# Patient Record
Sex: Male | Born: 1965
Health system: Southern US, Community
[De-identification: ages and names within clinical notes are randomized; demographics above are authoritative.]

## PROBLEM LIST (undated history)

## (undated) DIAGNOSIS — K219 Gastro-esophageal reflux disease without esophagitis: Secondary | ICD-10-CM

## (undated) DIAGNOSIS — T4145XA Adverse effect of unspecified anesthetic, initial encounter: Secondary | ICD-10-CM

## (undated) DIAGNOSIS — E785 Hyperlipidemia, unspecified: Secondary | ICD-10-CM

## (undated) DIAGNOSIS — F329 Major depressive disorder, single episode, unspecified: Secondary | ICD-10-CM

## (undated) DIAGNOSIS — F32A Depression, unspecified: Secondary | ICD-10-CM

## (undated) DIAGNOSIS — Z8719 Personal history of other diseases of the digestive system: Secondary | ICD-10-CM

## (undated) DIAGNOSIS — Z9889 Other specified postprocedural states: Secondary | ICD-10-CM

## (undated) DIAGNOSIS — T8859XA Other complications of anesthesia, initial encounter: Secondary | ICD-10-CM

## (undated) DIAGNOSIS — D0430 Carcinoma in situ of skin of unspecified part of face: Secondary | ICD-10-CM

## (undated) DIAGNOSIS — R519 Headache, unspecified: Secondary | ICD-10-CM

## (undated) DIAGNOSIS — F419 Anxiety disorder, unspecified: Secondary | ICD-10-CM

## (undated) DIAGNOSIS — I10 Essential (primary) hypertension: Secondary | ICD-10-CM

## (undated) DIAGNOSIS — R51 Headache: Secondary | ICD-10-CM

## (undated) DIAGNOSIS — C801 Malignant (primary) neoplasm, unspecified: Secondary | ICD-10-CM

## (undated) DIAGNOSIS — K210 Gastro-esophageal reflux disease with esophagitis: Secondary | ICD-10-CM

## (undated) DIAGNOSIS — K449 Diaphragmatic hernia without obstruction or gangrene: Secondary | ICD-10-CM

## (undated) DIAGNOSIS — J302 Other seasonal allergic rhinitis: Secondary | ICD-10-CM

## (undated) HISTORY — PX: NOSE SURGERY: SHX723

## (undated) HISTORY — DX: Anxiety disorder, unspecified: F41.9

## (undated) HISTORY — DX: Personal history of other diseases of the digestive system: Z87.19

## (undated) HISTORY — DX: Gastro-esophageal reflux disease without esophagitis: K21.9

## (undated) HISTORY — DX: Essential (primary) hypertension: I10

## (undated) HISTORY — DX: Other specified postprocedural states: Z98.890

## (undated) HISTORY — DX: Other seasonal allergic rhinitis: J30.2

---

## 1998-05-04 ENCOUNTER — Ambulatory Visit (HOSPITAL_COMMUNITY): Admission: RE | Admit: 1998-05-04 | Discharge: 1998-05-04 | Payer: Self-pay | Admitting: Obstetrics and Gynecology

## 1998-05-21 HISTORY — PX: VARICOCELE EXCISION: SUR582

## 1998-06-04 ENCOUNTER — Ambulatory Visit (HOSPITAL_COMMUNITY): Admission: RE | Admit: 1998-06-04 | Discharge: 1998-06-04 | Payer: Self-pay | Admitting: Obstetrics and Gynecology

## 1999-09-08 ENCOUNTER — Encounter (INDEPENDENT_AMBULATORY_CARE_PROVIDER_SITE_OTHER): Payer: Self-pay | Admitting: Specialist

## 1999-09-08 ENCOUNTER — Ambulatory Visit (HOSPITAL_BASED_OUTPATIENT_CLINIC_OR_DEPARTMENT_OTHER): Admission: RE | Admit: 1999-09-08 | Discharge: 1999-09-08 | Payer: Self-pay | Admitting: Otolaryngology

## 2000-03-01 ENCOUNTER — Encounter (INDEPENDENT_AMBULATORY_CARE_PROVIDER_SITE_OTHER): Payer: Self-pay | Admitting: Specialist

## 2000-03-01 ENCOUNTER — Ambulatory Visit (HOSPITAL_BASED_OUTPATIENT_CLINIC_OR_DEPARTMENT_OTHER): Admission: RE | Admit: 2000-03-01 | Discharge: 2000-03-01 | Payer: Self-pay | Admitting: Otolaryngology

## 2001-02-27 ENCOUNTER — Ambulatory Visit (HOSPITAL_COMMUNITY): Admission: RE | Admit: 2001-02-27 | Discharge: 2001-02-27 | Payer: Self-pay | Admitting: Urology

## 2001-07-01 ENCOUNTER — Emergency Department (HOSPITAL_COMMUNITY): Admission: EM | Admit: 2001-07-01 | Discharge: 2001-07-01 | Payer: Self-pay | Admitting: Emergency Medicine

## 2001-07-01 ENCOUNTER — Encounter: Payer: Self-pay | Admitting: Emergency Medicine

## 2002-05-21 HISTORY — PX: SHOULDER SURGERY: SHX246

## 2003-06-18 ENCOUNTER — Encounter: Admission: RE | Admit: 2003-06-18 | Discharge: 2003-06-18 | Payer: Self-pay | Admitting: Family Medicine

## 2003-06-21 ENCOUNTER — Encounter: Admission: RE | Admit: 2003-06-21 | Discharge: 2003-06-21 | Payer: Self-pay | Admitting: Family Medicine

## 2007-02-20 ENCOUNTER — Encounter: Admission: RE | Admit: 2007-02-20 | Discharge: 2007-02-20 | Payer: Self-pay | Admitting: Otolaryngology

## 2007-05-22 HISTORY — PX: KNEE SURGERY: SHX244

## 2007-07-16 ENCOUNTER — Emergency Department (HOSPITAL_COMMUNITY): Admission: EM | Admit: 2007-07-16 | Discharge: 2007-07-16 | Payer: Self-pay | Admitting: Emergency Medicine

## 2010-03-28 ENCOUNTER — Encounter: Admission: RE | Admit: 2010-03-28 | Discharge: 2010-03-28 | Payer: Self-pay | Admitting: Otolaryngology

## 2010-06-14 DIAGNOSIS — F32A Depression, unspecified: Secondary | ICD-10-CM | POA: Insufficient documentation

## 2010-06-14 DIAGNOSIS — F329 Major depressive disorder, single episode, unspecified: Secondary | ICD-10-CM | POA: Insufficient documentation

## 2010-06-29 ENCOUNTER — Other Ambulatory Visit: Payer: Self-pay | Admitting: Otolaryngology

## 2010-10-06 NOTE — Op Note (Signed)
[place]. [facility]  Patient:    Jeremy Diaz, Jeremy Diaz                         MRN: [id number] Proc. Date: 10/12/99 Adm. Date:  [id number] Disc. Date: [id number] Attending:  Lucky Cowboy CC:         [facility]                           Operative Report  PREOPERATIVE DIAGNOSES:  Inferior turbinate hypertrophy, septal deviation, left concha bullosa, chronic ethmoid sinusitis, and chronic frontal sinusitis.  PROCEDURES PERFORMED:  Inferior turbinate reduction, septoplasty, left concha bullosa resection, bilateral total ethmoidectomies, and bilateral frontal recess explorations with InstaTrak guidance.  SURGEON:  Lucky Cowboy, M.D.  ANESTHESIA:  General endotracheal anesthesia.  ESTIMATED BLOOD LOSS:  50 cc.  SPECIMENS:  Ethmoid contents.  COMPLICATIONS:  None.  INDICATIONS:  This patient has undergone multiple courses of medical therapy with inability to resolve CT proven ethmoid and anterior frontal sinusitis. The patient is having persistent headaches.  For this reason, the above procedures are performed.  In addition, the patient is suffering from nasal obstruction, which has not been relieved with medical therapy.  FINDINGS:  The patient was noted to have significant nasal deformity with bilateral inferior turbinate hypertrophy, both bony and mucosal.  There was mucosal thickening, but no frank pus throughout both ethmoid cavities in the areas of the frontal recess.  DESCRIPTION OF PROCEDURE:  The patient was taken to the operating room and placed on the table in the supine position.  The table was then rotated counterclockwise 90 degrees, and the head was prepped with Betadine, and the InstaTrak system was placed securely.  The patient was then sterilely draped. The InstaTrak was then calibrated.  Each nasal cavity was then decongested with Afrin cottonoid sponges.  The left nasal cavity was performed first. 1% lidocaine with 1:100,000  of epinephrine was then used to inject the superior portion of the uncinate process and the insertion of the middle turbinate.  The middle turbinate head was also injected in a similar fashion. After allowing time for vasoconstriction, a #15 blade was used to make an incision in the vertical mid portion of the middle turbinate.  The lateral portion of the concha bullosa was then resected using the microdebrider, as well as the Tru-Cut forceps.  At this point, attention was then turned to resection of the uncinate processes, which was performed using pediatric, backbiting forceps and the microdebrider.  The ethmoid bulla was then identified and entered.  Initiation of the resection began inferiorly and medially, and it proceeded posteriorly.  Bone was excessively removed between the middle turbinate and the lamina papyracea, leaving an inferior strut along the ethmoid bulla.  After the skull base was identified posteriorly, the dissection continued in an anterior superior fashion.  The skull base was protected at all times.  The InstaTrak was used during different portions of this procedure.  The frontal recess area was then identified after taking down the agger nasi cell.  There was mucosal edema in this area.  The mucosa was not stripped from this area.  The frontal recess, although it was edematous, it was patent.  The anterior ethmoid artery was left undisturbed.  After this, attention was turned to the septoplasty portion of the procedure.  A left hemitransfixion incision was made with a #15 blade.  Mucoperichondrial and  mucoperiosteal flaps were elevated.  The bony cartilaginous junction was divided using a Therapist, nutritional, and the posterior portion of the quadrangular cartilage, the anterior portion of the perpendicular plate of the ethmoid, and the vomer were dissected after sharp division superiorly using the Jansen-Middleton forceps.  The maxillary crest was then taken down using  an osteotome in standard fashion.  There was significant spur inferiorly.  There was a significant anterior, left septal deviation.  This appeared to be medialized after resection of the posterior portion of the quadrangular bone and taking an inferior strut of the cartilage.  Once this was performed, attention was then turned to the right ethmoidectomy portion of the procedure. In a similar fashion, injection was performed of the lateral nasal wall on the uncinate process.  The uncinate process was then taken down after hemostasis, using the pediatric backbiting forceps.  Once this was performed, the ethmoid bulla was entered low and medially with the microdebrider.  The microdebrider was then used to successively and cleanly remove bone and mucosa down to the skull base posteriorly.  The skull base was then traced in a superior and anterior fashion.  The frontal recess was identified after taking down the agger nasi cell.  There was significant mucosal edema.  However, the frontal recess area appeared opened.  Once this was performed, the inferior turbinates were reduced.  This was performed after medialization injection with 1% lidocaine with 1:100,000 of epinephrine.  The inferior portion of the turbinates were resected using the microdebrider and the Tru-Cut to reduce the bone.  Suction cautery was used for hemostasis bilaterally.  There was a small tear in the left septal cartilage.  It was reapproximated in a simple, interrupted fashion using 5-0 chromic.  There was also a small tear along the anterior, right septum, which was reapproximated in a similar fashion. Gelfilm was placed in each ethmoid cavity, which were then filled with Bactroban ointment.  Once this was performed, the hemitransfixion was reapproximated in a simple, interrupted fashion using 4-0 chromic.  Each nasal cavity was packed with Merocel packs, which were coated with Bactroban ointment.  Prior to this, septal  stents were placed and secured transseptally in a horizontal mattress fashion with #0 silk.  The oral cavity was then suctioned of any blood.  The table was rotated clockwise 90 degrees to its  original position.  The patient was awakened from anesthesia and extubated in the operating room.  He was taken to the post anesthesia care unit in stable condition.  There were no complications. DD:  10/12/99 TD:  10/16/99 Job: [id number]

## 2010-10-06 NOTE — Op Note (Signed)
[facility]  Patient:    Jeremy Diaz, Jeremy Diaz Visit Number: [id number] MRN: [id number]          Service Type: DSU Location: DAY Attending Physician:  Lurene Shadow. Date: 02/27/01 Admit Date:  02/27/2001                             Operative Report  PREOPERATIVE DIAGNOSIS.  Left varicocele.  POSTOPERATIVE DIAGNOSIS:  Left varicocele.  OPERATION:  Laparoscopic left internal splanchnic vein ligation.  SURGEON:  Sigmund I. Patsi Sears, M.D.  ANESTHESIA:  General endotracheal.  PREPARATION:  After appropriate preanesthesia, the patient was brought to the operating room, placed on the operating table in the dorsal supine position where general endotracheal anesthesia was introduced.  Foley catheter was inserted, and the abdomen was shaved, prepped and draped with Betadine solution and draped in the usual fashion.  He was placed in Trendelenburg.  DESCRIPTION OF PROCEDURE:  A size 10-11 port was placed through the 6 oclock position at the navel and into the abdomen without difficulty.  Two other ports were then placed in the right abdomen, a 5 mm port in the right upper abdomen, and a 10 mm port in the right lower abdomen.  Cauterization of the subcu tissue was accomplished.  At the 10 mm port site, a large vein was identified subcutaneously, and this was ligated with 3-0 silk suture.  No bleeding was noted.  Once the ports were placed, the left spermatic cord was identified.  The vas was identified at the inferior portion of the spermatic cord, and the entire rest of the spermatic cord was clipped with multiple clips.  The artery to the testicle was not completely identified, but the posterior portion around the vas was not dissected in order to preserve the artery to the vas.  There was no artery found in the portion of the cord which was clipped.  Following this, no bleeding was noted.  Each port entrance was evaluated, and there was no  evidence of bleeding.  Using the Endoclose instrument, a 3-0 Vicryl suture was used at the 10-11 site, and the fascia closed easily.  The superficial tissue was then closed with 3-0 Vicryl suture and 4-0 Vicryl was used to close the skin.  Benzoin and Steri-Strips were then placed over the wounds.  The video port was closed with 2-0 Vicryl, and then 3-0 Vicryl.  Then 4-0 Vicryl was used to close the skin, and Benzoin and Steri-Strips were placed.  Then 4-0 Vicryl was used to close the 5 mm port, and Benzoin and Steri-Strips were placed as well.  The patient tolerated the procedure well. It was noted that all CO2 was evacuated from the scrotum and the abdomen prior to the finish of the case.  The patient received 30 mg of IV Toradol prior to the end of the case.  He was awakened and taken to recovery room in good condition. Attending Physician:  Laqueta Jean DD:  02/27/01 TD:  02/27/01 Job: [id number]

## 2010-10-06 NOTE — Op Note (Signed)
[place]. [facility]  Patient:    Jeremy Diaz, Jeremy Diaz                         MRN: [id number] Proc. Date: 03/01/00 Adm. Date:  [id number] Attending:  Lucky Cowboy CC:         [facility]  Louanna Raw, M.D.  Dr. Mollie Germany   Operative Report  PREOPERATIVE DIAGNOSES:  Chronic right maxillary sinusitis and ethmoid sinusitis, septal deflection.  POSTOPERATIVE DIAGNOSES:  Chronic right maxillary sinusitis and ethmoid sinusitis, septal deflection.  PROCEDURE:  Revision septoplasty, polypectomy from right middle turbinate and right maxillary antrotomy.  SURGEON:  Lucky Cowboy, M.D.  ANESTHESIA:  General.  ESTIMATED BLOOD LOSS:  30 cc.  SPECIMENS:  None.  COMPLICATIONS:  None.  INDICATIONS:  This patient is a 45 year old male, who has undergone sinus surgery and resection of right concha bullosa with septoplasty in May 2001. He has been noted to have some polypoid regrowth in the right ethmoid cavity or on the medial surface of the middle turbinate, which does not permit visualization of the ethmoid cavity.  There is opacification of the right maxillary sinus.  In addition, there has been some nasal congestion.  FINDINGS:  The patient was noted to have polypoid regrowth with granulation tissue on the medial surface of the cut edge of the right middle turbinate. The ethmoid cavity was clear without polypoid regrowth.  A right maxillary natural ostium was obstructed with perfuse granular and inflammatory tissue. This was removed.  There was edema in the maxillary sinus.  There was a slight bow toward the left side of the anterior septum.  Septum was midline, however. The patient does have small nasal vestibules.  The septum was resected inferiorly to allow relaxation and medialization of the bowing portion of the turbinate.  Cross hatches were also made on the left side.  PROCEDURE:  The patient was taken to the operating room and placed on  the table in the supine position.  He was then placed under general endotracheal anesthesia and the table rotated counterclockwise 90 degrees.  Instatrak head gear was applied.  It was calibrated after the patient was prepped and draped with Betadine and draped in the usual sterile fashion.  Each nasal cavity was decongested with Afrin on cottonoid sponges.  Calibration of the Instatrak was performed.  Right nasal sinus surgery was performed first.  The polyp, as well as, the lateral sinus wall was injected with 1% lidocaine with 1:100,000 epinephrine.  After allowing time for hemostasis, debrider was then used to resect the medial portion of the middle turbinate.  There was a moderate amount of bleeding.  The ethmoid cavity was then visualized and found to be clear without regrowth or signs of infection.  Attempt was made to visualize the natural antrotomy site, however this was quite difficult.  A defect in the wall was then created and an opening enhanced with the microdebrider and through-cut forceps.  Maxillary sinus was then suctioned out completely. Attention was then turned to the revision septoplasty.  The anterior septum was injected with 1% lidocaine with 1:100,000 epinephrine. After allowing time for decongestion, a left hemitransfixion incision was made and submucoperichondrial flap elevated.  The inferior portion of the septum was taken down leaving approximately a 1 cm strut anteriorly.  Cross hatches were made in the posterior portion of the cartilage.  Septum was manually medialized.  Once this was performed, ______ nasal cavity  was patent.  The incision was reapproximated in a simple interrupted fashion using 4-0 chromic. Two Merocel packs were placed in the left anterior nasal cavity after placing a mattress stitch through the septum using 4-0 chromic.  Merocel pack was also placed on the lateral surface of the right middle turbinate.  The anterior septum was also packed  with an anterior Merocel pack as it was noted that the mattress suture had somewhat relaxed after placing the packs on the left side. Oral cavity was suctioned, the table rotated clockwise 90 degrees to its original position.  The patient was awakened from anesthesia and taken to the post anesthesia care unit in stable condition.  There were no complications. DD:  03/01/00 TD:  03/02/00 Job: [id number]

## 2011-02-21 ENCOUNTER — Encounter (INDEPENDENT_AMBULATORY_CARE_PROVIDER_SITE_OTHER): Payer: Self-pay | Admitting: General Surgery

## 2011-02-23 ENCOUNTER — Encounter (INDEPENDENT_AMBULATORY_CARE_PROVIDER_SITE_OTHER): Payer: Self-pay | Admitting: General Surgery

## 2011-02-23 ENCOUNTER — Ambulatory Visit (INDEPENDENT_AMBULATORY_CARE_PROVIDER_SITE_OTHER): Payer: BC Managed Care – PPO | Admitting: General Surgery

## 2011-02-23 VITALS — BP 108/70 | HR 76 | Temp 97.7°F | Ht 67.0 in | Wt 186.0 lb

## 2011-02-23 DIAGNOSIS — IMO0002 Reserved for concepts with insufficient information to code with codable children: Secondary | ICD-10-CM

## 2011-02-23 DIAGNOSIS — S76219A Strain of adductor muscle, fascia and tendon of unspecified thigh, initial encounter: Secondary | ICD-10-CM

## 2011-02-23 NOTE — Progress Notes (Signed)
Chief Complaint  Patient presents with  . right inguinal hernia    HPI Jeremy Diaz is a 45 y.o. male.   HPI 45 year old Caucasian male referred by Dr. Collins Scotland for evaluation of right inguinal pain. The patient states that he has been having discomfort in his right groin for 6 weeks. He denies any trauma to the area. He denies any heavy lifting. He states that he has a constant discomfort in one area. It will occasionally become sharp. The increased discomfort will last for about 15 minutes. It will feel like a burning sensation when it increases in severity. He describes the constant discomfort as a pain that is always there. He denies any dysuria, diarrhea, constipation, nausea, or vomiting. He has never noticed a bulge. He initially saw a urologist his thought that it was more consistent with a inguinal strain. He then followed up with his primary care physician. He has had varicocele surgery in the past. He has not tried any medications to help with the discomfort. Past Medical History  Diagnosis Date  . Seasonal allergies   . GERD (gastroesophageal reflux disease)   . Anxiety     Past Surgical History  Procedure Date  . Nose surgery 200/2003/2008/2012  . Knee surgery 2009    right  . Shoulder surgery 2004    left  . Varicocele excision 2000    No family history on file.  Social History History  Substance Use Topics  . Smoking status: Never Smoker   . Smokeless tobacco: Not on file  . Alcohol Use: No    Allergies  Allergen Reactions  . Skelaxin Hives    Current Outpatient Prescriptions  Medication Sig Dispense Refill  . AMBULATORY NON FORMULARY MEDICATION Inject into the muscle 2 (two) times a week. Medication Name:allergy shots       . citalopram (CELEXA) 40 MG tablet Take 40 mg by mouth daily.        Jeremy Diaz esomeprazole (NEXIUM) 40 MG capsule Take 40 mg by mouth daily before breakfast.          Review of Systems Review of Systems  Constitutional: Positive for activity  change (see hpi). Negative for chills, fatigue and unexpected weight change.  HENT: Negative for nosebleeds and neck pain.   Eyes: Negative for photophobia and visual disturbance.  Respiratory: Negative for chest tightness and shortness of breath.   Cardiovascular: Negative for chest pain and leg swelling.  Gastrointestinal:       See hpi  Genitourinary: Negative for dysuria, urgency, hematuria, discharge, difficulty urinating and testicular pain.  Musculoskeletal: Negative for arthralgias.  Neurological: Positive for headaches. Negative for seizures, syncope, speech difficulty and numbness.  Hematological: Negative.   Psychiatric/Behavioral: Negative.     Blood pressure 108/70, pulse 76, temperature 97.7 F (36.5 C), temperature source Temporal, height 5\' 7"  (1.702 m), weight 186 lb (84.369 kg).  Physical Exam Physical Exam  Vitals reviewed. Constitutional: He is oriented to person, place, and time. He appears well-developed and well-nourished.  HENT:  Head: Normocephalic and atraumatic.  Eyes: Conjunctivae are normal. No scleral icterus.  Neck: Normal range of motion. Neck supple. No JVD present. No tracheal deviation present.  Cardiovascular: Normal rate and regular rhythm.   Pulmonary/Chest: Effort normal and breath sounds normal. He has no wheezes. He exhibits no tenderness.  Abdominal: Soft. Bowel sounds are normal. He exhibits no distension. There is no tenderness. There is no rebound.       Well healed umbilical incision; can't palpate inguinal  hernia supine or standing  Genitourinary: Testes normal and penis normal.  Musculoskeletal: Normal range of motion.  Lymphadenopathy:    He has no cervical adenopathy.  Neurological: He is alert and oriented to person, place, and time.  Skin: Skin is warm and dry.  Psychiatric: He has a normal mood and affect. His behavior is normal. Judgment and thought content normal.    Data Reviewed Dr Alda Berthold note  Assessment    Right  inguinal strain    Plan    I donot feel a hernia on exam.  I think this is most consistent with a inguinal strain with resultant nerve irritation. The pt was given Agricultural engineer.     We discussed several options. We discussed ongoing observation versus imaging. He has been having symptoms for 6 weeks without any improvement. We discussed the utility of an ultrasound versus a CT scan versus an MRI. I do not believe an ultrasound would be of much benefit. We discussed the pros and cons of getting a CT scan of his pelvis. I explained that it is not 100% sensitive or specific to confirm an inguinal hernia. However the patient would like to proceed with getting a CT scan of the pelvis to confirm a physical exam findings. I do not think this is unreasonable since he has not noticed any improvement in the past 6 weeks. It is possible he may have a early or small inguinal hernia. We will schedule a CT of his pelvis for early next week. I will base my followup on the results of the CAT scan.   [name]. Andrey Campanile, MD, FACS  Gaynelle Adu M 02/23/2011, 1:58 PM

## 2011-02-23 NOTE — Patient Instructions (Signed)
Inguinal Strain Your exam shows you have an inguinal strain. This is also known as a pulled groin. This injury is usually due to a pull or partial tear to a muscle or tendon in the groin area. Most groin pulls take several weeks to heal completely. There may be pain with lifting your leg or walking during much of your recovery. Treatment for groin strains includes:  Rest and avoid lifting or performing activities that increase your pain.   Apply ice packs for 20-30 minutes every few hours to reduce pain and swelling over the next 2-3 days.   Medicine to reduce pain and inflammation is often prescribed.   You can take Motrin or ibuprofen (follow package directions) HOME CARE INSTRUCTIONS While most strains in the groin area will heal with rest, you should also watch for any signs of a more serious condition.  SEEK IMMEDIATE MEDICAL CARE IF:  You notice unusual swelling or bulging in the groin.   You have pain or swelling in the testicle.   Blood in your urine.   Marked increased pain.   Weakness or numbness of your leg or abdominal pain.  MAKE SURE YOU:   Understand these instructions.   Will watch your condition.   Will get help right away if you are not doing well or get worse.  Document Released: 06/14/2004 Document Re-Released: 04/19/2008 [facility] Patient Information 2011 [place], [place].

## 2011-02-28 ENCOUNTER — Ambulatory Visit
Admission: RE | Admit: 2011-02-28 | Discharge: 2011-02-28 | Disposition: A | Discharge status: Home / Self Care | Payer: BC Managed Care – PPO | Source: Ambulatory Visit | Attending: General Surgery | Admitting: General Surgery

## 2011-02-28 ENCOUNTER — Telehealth (INDEPENDENT_AMBULATORY_CARE_PROVIDER_SITE_OTHER): Payer: Self-pay | Admitting: General Surgery

## 2011-02-28 DIAGNOSIS — S76219A Strain of adductor muscle, fascia and tendon of unspecified thigh, initial encounter: Secondary | ICD-10-CM

## 2011-02-28 MED ORDER — IOHEXOL 300 MG/ML  SOLN
100.0000 mL | Freq: Once | INTRAMUSCULAR | Status: AC | PRN
  Administered 2011-02-28: 100 mL via INTRAVENOUS

## 2011-02-28 NOTE — Telephone Encounter (Signed)
Patient called for CT results. I called him back and let him know that the CT shows hernias on both sides in his groin. The doctor will discuss further at his follow up.

## 2011-03-13 ENCOUNTER — Encounter (INDEPENDENT_AMBULATORY_CARE_PROVIDER_SITE_OTHER): Payer: Self-pay | Admitting: General Surgery

## 2011-03-13 ENCOUNTER — Ambulatory Visit (INDEPENDENT_AMBULATORY_CARE_PROVIDER_SITE_OTHER): Payer: BC Managed Care – PPO | Admitting: General Surgery

## 2011-03-13 VITALS — BP 114/84 | HR 64 | Temp 98.3°F | Resp 20 | Ht 67.0 in | Wt 188.4 lb

## 2011-03-13 DIAGNOSIS — K4020 Bilateral inguinal hernia, without obstruction or gangrene, not specified as recurrent: Secondary | ICD-10-CM

## 2011-03-13 NOTE — Patient Instructions (Signed)
We will call you regarding scheduling the repair of your hernias.

## 2011-03-13 NOTE — Progress Notes (Signed)
Jeremy Diaz is a 45 year old man see by Dr. Andrey Campanile for right groin pain.  He does a lot of repetitive lifting at work.  Dr. Andrey Campanile could not feel a definite hernia on exam and so a CT scan was ordered.  This demonstrated bilateral inguinal hernias containing fat with the left side larger than the right side.  There were clips noted in the left groin as well.  He has asked to see me regarding this.  Exam:  Right groin- no definite hernia is felt.  Left groin- small inguinal bulge felt accentuated with a cough.  Assessment:  Bilateral inguinal hernias containing fat.  Right side with sxs, left side is asx.  He has had a previous laparoscopic left varicocelectomy by Dr. Patsi Sears.  It is unclear whether this was an intraperitoneal or extraperitoneal procedure.  Plan:  I recommended laparoscopic repair of the hernias with mesh.  TEPP vs. TAPP depending on how his varicocelectomy was done.  Other options are open right inguinal repair with repair of the left side at a later date or open bilateral repairs.  I have explained the procedure, risks, and aftercare of inguinal hernia repair.  Risks include but are not limited to bleeding, infection, wound problems, anesthesia, recurrence, bladder or intestine injury, urinary retention, testicular dysfunction, chronic pain, mesh problems.  He seems to understand and agrees to proceed.  Will talk with Dr. Patsi Sears and then call the patient back.

## 2011-03-16 ENCOUNTER — Telehealth (INDEPENDENT_AMBULATORY_CARE_PROVIDER_SITE_OTHER): Payer: Self-pay | Admitting: General Surgery

## 2011-03-16 NOTE — Telephone Encounter (Signed)
I spoke with Dr. Patsi Sears and he did an intra peritoneal procedure.  Thus, I feel there is a good chance a TEPP can be done with good results for Jeremy Diaz bilateral inguinal hernias.  We may have convert to open.  Mr. Ginyard and I discussed this and he agrees with the plan.  Will work on getting this scheduled.

## 2011-03-26 ENCOUNTER — Encounter (HOSPITAL_COMMUNITY): Payer: Self-pay | Admitting: Pharmacy Technician

## 2011-03-27 ENCOUNTER — Encounter (HOSPITAL_COMMUNITY): Payer: BC Managed Care – PPO

## 2011-03-27 ENCOUNTER — Encounter (HOSPITAL_COMMUNITY): Payer: Self-pay

## 2011-03-27 DIAGNOSIS — K219 Gastro-esophageal reflux disease without esophagitis: Secondary | ICD-10-CM

## 2011-03-27 HISTORY — DX: Gastro-esophageal reflux disease without esophagitis: K21.9

## 2011-03-27 LAB — SURGICAL PCR SCREEN
MRSA, PCR: NEGATIVE
Staphylococcus aureus: POSITIVE — AB

## 2011-03-27 NOTE — Pre-Procedure Instructions (Signed)
Pt. Has Amoxicillin "itch" allergy-fax 03-27-11 to Dr. Maris Berger office to advise.

## 2011-03-27 NOTE — Pre-Procedure Instructions (Signed)
PCR screen positive for staph aureus-pt. /MD notified.

## 2011-03-27 NOTE — Patient Instructions (Signed)
20 Jeremy Diaz  03/27/2011   Your procedure is scheduled on: 04-02-11  Report to [facility] at 1015AM.  Call this number if you have problems the morning of surgery: [phone]   Remember:   Do not eat food:After Midnight.  Do not drink clear liquids: 6 Hours before arrival.-nothing after 0600am  Take these medicines the morning of surgery with A SIP OF WATER :Citalopram, Nexium   Do not wear jewelry, make-up or nail polish.  Do not wear lotions, powders, or perfumes. You may wear deodorant.  Do not shave 48 hours prior to surgery.  Do not bring valuables to the hospital.  Contacts, dentures or bridgework may not be worn into surgery.  Leave suitcase in the car. After surgery it may be brought to your room.  For patients admitted to the hospital, checkout time is 11:00 AM the day of discharge.   Patients discharged the day of surgery will not be allowed to drive home.  Name and phone number of your driver:Jeremy Diaz ,spouse [phone]  Special Instructions: CHG Shower Use Special Wash: 1/2 bottle night before surgery and 1/2 bottle morning of surgery.   Please read over the following fact sheets that you were given: MRSA Information

## 2011-03-27 NOTE — Pre-Procedure Instructions (Addendum)
03-27-11-no labs required per pt. Medical history.-W. Alica Shellhammer,RN and anesthesia protocal. Pt aware no Aspirin X 5 days prior.

## 2011-03-28 ENCOUNTER — Encounter (INDEPENDENT_AMBULATORY_CARE_PROVIDER_SITE_OTHER): Payer: Self-pay

## 2011-03-30 ENCOUNTER — Encounter (INDEPENDENT_AMBULATORY_CARE_PROVIDER_SITE_OTHER): Payer: BC Managed Care – PPO | Admitting: General Surgery

## 2011-04-02 ENCOUNTER — Ambulatory Visit (HOSPITAL_COMMUNITY)
Admission: RE | Admit: 2011-04-02 | Discharge: 2011-04-02 | Disposition: A | Discharge status: Home / Self Care | Payer: BC Managed Care – PPO | Source: Ambulatory Visit | Attending: General Surgery | Admitting: General Surgery

## 2011-04-02 ENCOUNTER — Ambulatory Visit (HOSPITAL_COMMUNITY): Payer: BC Managed Care – PPO | Admitting: Certified Registered Nurse Anesthetist

## 2011-04-02 ENCOUNTER — Encounter (HOSPITAL_COMMUNITY): Payer: Self-pay | Admitting: Certified Registered Nurse Anesthetist

## 2011-04-02 ENCOUNTER — Encounter (HOSPITAL_COMMUNITY): Payer: Self-pay | Admitting: *Deleted

## 2011-04-02 ENCOUNTER — Encounter (HOSPITAL_COMMUNITY)
Admission: RE | Disposition: A | Discharge status: Home / Self Care | Payer: Self-pay | Source: Ambulatory Visit | Attending: General Surgery

## 2011-04-02 DIAGNOSIS — K4020 Bilateral inguinal hernia, without obstruction or gangrene, not specified as recurrent: Secondary | ICD-10-CM

## 2011-04-02 DIAGNOSIS — K219 Gastro-esophageal reflux disease without esophagitis: Secondary | ICD-10-CM | POA: Insufficient documentation

## 2011-04-02 DIAGNOSIS — Z01812 Encounter for preprocedural laboratory examination: Secondary | ICD-10-CM | POA: Insufficient documentation

## 2011-04-02 DIAGNOSIS — Z01818 Encounter for other preprocedural examination: Secondary | ICD-10-CM | POA: Insufficient documentation

## 2011-04-02 DIAGNOSIS — Z79899 Other long term (current) drug therapy: Secondary | ICD-10-CM | POA: Insufficient documentation

## 2011-04-02 DIAGNOSIS — F411 Generalized anxiety disorder: Secondary | ICD-10-CM | POA: Insufficient documentation

## 2011-04-02 HISTORY — PX: INGUINAL HERNIA REPAIR: SHX194

## 2011-04-02 SURGERY — REPAIR, HERNIA, INGUINAL, BILATERAL, LAPAROSCOPIC
Anesthesia: General | Site: Groin | Laterality: Bilateral | Wound class: Clean

## 2011-04-02 MED ORDER — BUPIVACAINE-EPINEPHRINE 0.5% -1:200000 IJ SOLN
INTRAMUSCULAR | Status: DC | PRN
  Administered 2011-04-02: 20 mL

## 2011-04-02 MED ORDER — ONDANSETRON HCL 4 MG/2ML IJ SOLN
INTRAMUSCULAR | Status: DC | PRN
  Administered 2011-04-02: 4 mg via INTRAVENOUS

## 2011-04-02 MED ORDER — OXYCODONE-ACETAMINOPHEN 5-325 MG PO TABS: 1.0000 | ORAL_TABLET | ORAL | Status: AC | PRN

## 2011-04-02 MED ORDER — PROMETHAZINE HCL 25 MG/ML IJ SOLN: 6.2500 mg | INTRAMUSCULAR | Status: DC | PRN

## 2011-04-02 MED ORDER — ETOMIDATE 2 MG/ML IV SOLN
INTRAVENOUS | Status: DC | PRN
  Administered 2011-04-02: 14 mg via INTRAVENOUS

## 2011-04-02 MED ORDER — LIDOCAINE HCL (CARDIAC) 20 MG/ML IV SOLN
INTRAVENOUS | Status: DC | PRN
  Administered 2011-04-02: 80 mg via INTRAVENOUS

## 2011-04-02 MED ORDER — LACTATED RINGERS IV SOLN
INTRAVENOUS | Status: DC
  Administered 2011-04-02: 1000 mL via INTRAVENOUS
  Administered 2011-04-02: 15:00:00 via INTRAVENOUS

## 2011-04-02 MED ORDER — EPHEDRINE SULFATE 50 MG/ML IJ SOLN
INTRAMUSCULAR | Status: DC | PRN
  Administered 2011-04-02: 10 mg via INTRAVENOUS

## 2011-04-02 MED ORDER — ACETAMINOPHEN 10 MG/ML IV SOLN
INTRAVENOUS | Status: AC
  Filled 2011-04-02: qty 100

## 2011-04-02 MED ORDER — ACETAMINOPHEN 10 MG/ML IV SOLN
INTRAVENOUS | Status: DC | PRN
  Administered 2011-04-02: 1000 mg via INTRAVENOUS

## 2011-04-02 MED ORDER — LACTATED RINGERS IV SOLN: INTRAVENOUS | Status: DC | PRN

## 2011-04-02 MED ORDER — FENTANYL CITRATE 0.05 MG/ML IJ SOLN
INTRAMUSCULAR | Status: DC | PRN
  Administered 2011-04-02 (×5): 50 ug via INTRAVENOUS

## 2011-04-02 MED ORDER — BUPIVACAINE-EPINEPHRINE 0.5% -1:200000 IJ SOLN
INTRAMUSCULAR | Status: AC
  Filled 2011-04-02: qty 1

## 2011-04-02 MED ORDER — PROMETHAZINE HCL 25 MG/ML IJ SOLN: 12.5000 mg | Freq: Four times a day (QID) | INTRAMUSCULAR | Status: DC | PRN

## 2011-04-02 MED ORDER — FENTANYL CITRATE 0.05 MG/ML IJ SOLN
INTRAMUSCULAR | Status: AC
  Filled 2011-04-02: qty 2

## 2011-04-02 MED ORDER — GLYCOPYRROLATE 0.2 MG/ML IJ SOLN
INTRAMUSCULAR | Status: DC | PRN
  Administered 2011-04-02: .8 mg via INTRAVENOUS

## 2011-04-02 MED ORDER — OXYCODONE HCL 5 MG PO TABS
5.0000 mg | ORAL_TABLET | ORAL | Status: DC | PRN
  Administered 2011-04-02: 10 mg via ORAL

## 2011-04-02 MED ORDER — MIDAZOLAM HCL 5 MG/5ML IJ SOLN
INTRAMUSCULAR | Status: DC | PRN
  Administered 2011-04-02: 2 mg via INTRAVENOUS

## 2011-04-02 MED ORDER — CEFAZOLIN SODIUM-DEXTROSE 2-3 GM-% IV SOLR
2.0000 g | Freq: Once | INTRAVENOUS | Status: AC
  Administered 2011-04-02: 2 g via INTRAVENOUS
  Filled 2011-04-02: qty 50

## 2011-04-02 MED ORDER — OXYCODONE HCL 5 MG PO TABS
ORAL_TABLET | ORAL | Status: AC
  Administered 2011-04-02: 10 mg via ORAL
  Filled 2011-04-02: qty 2

## 2011-04-02 MED ORDER — NEOSTIGMINE METHYLSULFATE 1 MG/ML IJ SOLN
INTRAMUSCULAR | Status: DC | PRN
  Administered 2011-04-02: 5 mg via INTRAVENOUS

## 2011-04-02 MED ORDER — DEXAMETHASONE SODIUM PHOSPHATE 10 MG/ML IJ SOLN
INTRAMUSCULAR | Status: DC | PRN
  Administered 2011-04-02: 10 mg via INTRAVENOUS

## 2011-04-02 MED ORDER — FENTANYL CITRATE 0.05 MG/ML IJ SOLN
25.0000 ug | INTRAMUSCULAR | Status: DC | PRN
  Administered 2011-04-02 (×3): 50 ug via INTRAVENOUS

## 2011-04-02 MED ORDER — ROCURONIUM BROMIDE 100 MG/10ML IV SOLN
INTRAVENOUS | Status: DC | PRN
  Administered 2011-04-02 (×2): 10 mg via INTRAVENOUS
  Administered 2011-04-02 (×2): 5 mg via INTRAVENOUS
  Administered 2011-04-02: 40 mg via INTRAVENOUS

## 2011-04-02 MED ORDER — CEFAZOLIN SODIUM 1-5 GM-% IV SOLN
INTRAVENOUS | Status: AC
  Filled 2011-04-02: qty 100

## 2011-04-02 SURGICAL SUPPLY — 35 items
APPLIER CLIP 5 13 M/L LIGAMAX5 (MISCELLANEOUS) ×2
BENZOIN TINCTURE PRP APPL 2/3 (GAUZE/BANDAGES/DRESSINGS) ×2 IMPLANT
CABLE HIGH FREQUENCY MONO STRZ (ELECTRODE) IMPLANT
CHLORAPREP W/TINT 26ML (MISCELLANEOUS) ×2 IMPLANT
CLIP APPLIE 5 13 M/L LIGAMAX5 (MISCELLANEOUS) ×1 IMPLANT
CLOTH BEACON ORANGE TIMEOUT ST (SAFETY) ×2 IMPLANT
DECANTER SPIKE VIAL GLASS SM (MISCELLANEOUS) ×2 IMPLANT
DEVICE SECURE STRAP 25 ABSORB (INSTRUMENTS) ×2 IMPLANT
DISSECT BALLN SPACEMKR + OVL (BALLOONS) ×2
DISSECTOR BALLN SPACEMKR + OVL (BALLOONS) ×1 IMPLANT
DISSECTOR BLUNT TIP ENDO 5MM (MISCELLANEOUS) ×2 IMPLANT
DRAPE LAPAROSCOPIC ABDOMINAL (DRAPES) ×2 IMPLANT
DRAPE UTILITY XL STRL (DRAPES) ×2 IMPLANT
DRSG TEGADERM 2-3/8X2-3/4 SM (GAUZE/BANDAGES/DRESSINGS) ×4 IMPLANT
ELECT REM PT RETURN 9FT ADLT (ELECTROSURGICAL) ×2
ELECTRODE REM PT RTRN 9FT ADLT (ELECTROSURGICAL) ×1 IMPLANT
GLOVE BIOGEL PI IND STRL 7.0 (GLOVE) ×1 IMPLANT
GLOVE BIOGEL PI INDICATOR 7.0 (GLOVE) ×1
GLOVE ECLIPSE 8.0 STRL XLNG CF (GLOVE) ×2 IMPLANT
GLOVE INDICATOR 8.0 STRL GRN (GLOVE) ×4 IMPLANT
GOWN STRL NON-REIN LRG LVL3 (GOWN DISPOSABLE) ×2 IMPLANT
GOWN STRL REIN XL XLG (GOWN DISPOSABLE) ×4 IMPLANT
KIT BASIN OR (CUSTOM PROCEDURE TRAY) ×2 IMPLANT
MESH PARIETEX 6X6 (Mesh General) ×4 IMPLANT
NEEDLE INSUFFLATION 14GA 120MM (NEEDLE) ×2 IMPLANT
PEN SKIN MARKING BROAD (MISCELLANEOUS) ×2 IMPLANT
SCISSORS LAP 5X35 DISP (ENDOMECHANICALS) ×2 IMPLANT
SET IRRIG TUBING LAPAROSCOPIC (IRRIGATION / IRRIGATOR) ×2 IMPLANT
SOLUTION ANTI FOG 6CC (MISCELLANEOUS) ×2 IMPLANT
STRIP CLOSURE SKIN 1/2X4 (GAUZE/BANDAGES/DRESSINGS) ×2 IMPLANT
SUT MNCRL AB 4-0 PS2 18 (SUTURE) ×2 IMPLANT
TOWEL OR 17X26 10 PK STRL BLUE (TOWEL DISPOSABLE) ×2 IMPLANT
TRAY LAP CHOLE (CUSTOM PROCEDURE TRAY) ×2 IMPLANT
TROCAR CANNULA W/PORT DUAL 5MM (MISCELLANEOUS) ×4 IMPLANT
TUBING INSUFFLATION 10FT LAP (TUBING) ×2 IMPLANT

## 2011-04-02 NOTE — Transfer of Care (Signed)
Immediate Anesthesia Transfer of Care Note  Patient: Jeremy Diaz  Procedure(s) Performed:  LAPAROSCOPIC BILATERAL INGUINAL HERNIA REPAIR - laparoscopic repair bilateral inguinal hernia  Patient Location: PACU  Anesthesia Type: General  Level of Consciousness: awake, alert , oriented and patient cooperative  Airway & Oxygen Therapy: Patient Spontanous Breathing and Patient connected to face mask oxygen  Post-op Assessment: Report given to PACU RN, Post -op Vital signs reviewed and stable and Patient moving all extremities  Post vital signs: Reviewed and stable  Complications: No apparent anesthesia complications

## 2011-04-02 NOTE — H&P (Signed)
Neurologic: Normal   Jeremy Diaz is an 45 y.o. male.    HPI: He has right groin pain and was found to have bilateral inguinal hernias.  He now presents for laparoscopic repair of bilateral inguinal hernias.  Past Medical History  Diagnosis Date  . Seasonal allergies   . Anxiety   . GERD (gastroesophageal reflux disease) 03-27-11    tx. Nexium    Past Surgical History  Procedure Date  . Nose surgery 200/2003/2008/2012  . Knee surgery 2009    right  . Shoulder surgery 2004    left  . Varicocele excision 2000    Medications Prior to Admission  Medication Dose Route Frequency Provider Last Rate Last Dose  . ceFAZolin (ANCEF) IVPB 2 g/50 mL premix  2 g Intravenous Once Adolph Pollack, MD      . lactated ringers infusion   Intravenous Continuous Gaetano Hawthorne, MD 100 mL/hr at 04/02/11 1200 1,000 mL at 04/02/11 1200   Medications Prior to Admission  Medication Sig Dispense Refill  . citalopram (CELEXA) 40 MG tablet Take 40 mg by mouth every morning.       Marland Kitchen esomeprazole (NEXIUM) 40 MG capsule Take 40 mg by mouth daily before breakfast.       . AMBULATORY NON FORMULARY MEDICATION Inject into the muscle 2 (two) times a week. Medication Name:allergy shots        Allergies:  Allergies  Allergen Reactions  . Eggs Or Egg-Derived Products Anaphylaxis  . Amoxil (Amoxicillin Trihydrate) Itching  . Banana Itching  . Skelaxin Hives    History reviewed. No pertinent family history.  Social History:  reports that he has never smoked. He has never used smokeless tobacco. He reports that he does not drink alcohol or use illicit drugs.  General:  Negative  Cardiac  :  Negative  Pulmonary:  Negative  Endocrine:  Negative  Skin:  Negative  Gastrointestinal:  Negative  Genitourinary:  Negative  Neurological:  Negative  Hematologic/Lymphatic:  Negative  HEENT:  Negative  Musculoskeletal:  Negative        BP 130/86  Pulse 65  Temp(Src) 98 F (36.7 C) (Oral)   Resp 18  Wt 190 lb (86.183 kg)  SpO2 97%  General Appearance:  Alert, cooperative, no distress, appears stated age  Head:  Normocephalic, without obvious abnormality, atraumatic  Eyes:   Conjunctiva/corneas clear, EOM's intact      Nose: Nares normal, no drainage   Mouth: Mucous membranes moist  Neck: Supple, symmetrical, trachea midline, no tenderness/mass/nodules, no JVD  Back:   na  Lungs:   Clear to auscultation bilaterally, respirations unlabored  Chest Wall:  No tenderness or deformity  Heart:  Regular rate and rhythm, S1, S2 normal, no murmur, rub or gallop  Abdomen:   Soft, non-tender, bowel sounds active all four quadrants,  no masses, no organomegaly, no scars  GU:  No masses, small left inguinal bulge is present  Rectal:  Not done  Extremities: Extremities normal, atraumatic, no cyanosis or edema     Skin: Skin color, texture, turgor normal, no rashes or lesions  Lymph nodes: No enlarged cervical or supraclavicular nodes                                  Neuro:  Alert and oriented, no focal deficits  No results found for this or any previous visit (from the past 48 hour(s)). No  results found.       Assessment/Plan Bilateral Inguinal hernias  Plan:  Laparoscopic repair with mesh. Procedure, risks, and aftercare explained to him preop.  Derek Huneycutt J 04/02/2011, 12:32 PM

## 2011-04-02 NOTE — Anesthesia Postprocedure Evaluation (Signed)
  Anesthesia Post-op Note  Patient: Jeremy Diaz  Procedure(s) Performed:  LAPAROSCOPIC BILATERAL INGUINAL HERNIA REPAIR - laparoscopic repair bilateral inguinal hernia  Patient Location: PACU  Anesthesia Type: General  Level of Consciousness: awake and alert   Airway and Oxygen Therapy: Patient Spontanous Breathing  Post-op Pain: mild  Post-op Assessment: Post-op Vital signs reviewed, Patient's Cardiovascular Status Stable, Respiratory Function Stable, Patent Airway and No signs of Nausea or vomiting  Post-op Vital Signs: stable  Complications: No apparent anesthesia complications

## 2011-04-02 NOTE — Interval H&P Note (Signed)
History and Physical Interval Note:   04/02/2011   12:37 PM   Jeremy Diaz  has presented today for surgery, with the diagnosis of bilateral inguinal hernia   The various methods of treatment have been discussed with the patient and family. After consideration of risks, benefits and other options for treatment, the patient has consented to  Procedure(s): LAPAROSCOPIC BILATERAL INGUINAL HERNIA REPAIR as a surgical intervention .  The patients' history has been reviewed, patient examined, no change in status, stable for surgery.  I have reviewed the patients' chart and labs.  Questions were answered to the patient's satisfaction.     Adolph Pollack  MD

## 2011-04-02 NOTE — Anesthesia Preprocedure Evaluation (Addendum)
Anesthesia Evaluation  Patient identified by MRN, date of birth, ID band Patient awake    Reviewed: Allergy & Precautions, H&P , NPO status , Patient's Chart, lab work & pertinent test results  Airway       Dental No notable dental hx. (+) Teeth Intact, Caps and Dental Advisory Given,    Pulmonary neg pulmonary ROS,  clear to auscultation  Pulmonary exam normal       Cardiovascular neg cardio ROS     Neuro/Psych Negative Neurological ROS  Negative Psych ROS   GI/Hepatic negative GI ROS, Neg liver ROS, GERD-  ,  Endo/Other  Negative Endocrine ROS  Renal/GU negative Renal ROS  Genitourinary negative   Musculoskeletal negative musculoskeletal ROS (+)   Abdominal Normal abdominal exam  (+)   Peds negative pediatric ROS (+)  Hematology negative hematology ROS (+)   Anesthesia Other Findings   Reproductive/Obstetrics negative OB ROS                          Anesthesia Physical Anesthesia Plan  ASA: I  Anesthesia Plan: General   Post-op Pain Management:    Induction:   Airway Management Planned: Oral ETT  Additional Equipment:   Intra-op Plan:   Post-operative Plan: Extubation in OR  Informed Consent: I have reviewed the patients History and Physical, chart, labs and discussed the procedure including the risks, benefits and alternatives for the proposed anesthesia with the patient or authorized representative who has indicated his/her understanding and acceptance.   Dental advisory given  Plan Discussed with: CRNA and Surgeon  Anesthesia Plan Comments:         Anesthesia Quick Evaluation

## 2011-04-02 NOTE — Preoperative (Signed)
Beta Blockers   Reason not to administer Beta Blockers:Not Applicable 

## 2011-04-02 NOTE — Discharge Summary (Signed)
  Able to ambulate twice around unit and tolerated well.

## 2011-04-02 NOTE — Op Note (Signed)
Preoperative diagnosis: Bilateral inguinal hernias  Postoperative diagnosis: Same (right side indirect, left side direct)  Procedure: Laparoscopic repair of bilateral inguinal hernias with mesh  Surgeon:  Avel Peace, M.D.  Anesthesia: Gen. Plus Marcaine local  Indication: Jeremy Diaz is a 45 year old male with right groin pain. Initially, he did not have obvious inguinal hernias on exam. A CT scan demonstrated bilateral inguinal hernias containing fat. He now presents for repair of the hernias.  The procedure, risks, aftercare, and the possibility of continued right groin pain were explained to him preoperatively.  Technique: He was seen in the holding area and voided. He is brought to the operating room placed supine on the operating table and a general anesthetic was administered. The hair on the abdominal wall and upper groin was clipped. The area was then sterilely prepped and draped. Marcaine was infiltrated in the subumbilical region where a previous scar was. A transverse incision was made in the subumbilical region through the skin and subcutaneous tissues. The right anterior rectus sheath was identified. A small incision was made in the right anterior rectus sheath. The underlying right rectus muscle was swept laterally exposing the posterior rectus sheath. A balloon dissection device was then placed in the extraperitoneal space. Blunt dissection was performed under laparoscopic vision. The balloon was removed and CO2 gas was insufflated through the trocar into the extraperitoneal space. Two 5 mm trochars were placed in the lower midline.  Using blunt dissection, the symphysis pubis was identified and Cooper's ligament was identified bilaterally. There was significant scarring in the right extraperitoneal space secondary to access for previous intraperitoneal procedure. I examined the direct space on the right side and it was solid. I dissected the tissue off the anterior and lateral  abdominal walls and exposed the spermatic cord on the right side. I created a posterior window around it.  Extraperitoneal fat was reduced out of a indirect hernia defect. The epigastric vessels were clipped and divided in order to provide better exposure on the right side. Next I approached the left side.  Using blunt dissection, fibrofatty tissue was swept off the anterior and lateral abdominal walls to the level of the umbilicus.  A direct hernia sac was identified. The spermatic cord was isolated and a window created around it. No indirect hernia was noted.  A piece of Parietex 15cm by 15 cm mesh was brought into the field. 2 cm was cut off of it. A partial longitudinal slit was cut into it.  The mesh was placed in the left extraperitoneal space. The 2 tails of the mesh were wrapped around the spermatic cord. The mesh was then anchored to Cooper's ligament, the anterior and lateral abdominal walls with absorbable tacks. This provided for good coverage with adequate overlap of the direct, and direct, and femoral spaces.  Next the right side was approached. A similar piece of mesh was placed into the right extraperitoneal space. This was more difficult to position because of the scar tissue from his previous surgery. The 2 tails of the mesh were wrapped around the spermatic cord. The mesh was anchored to Cooper's ligament, the anterior and lateral abdominal walls with absorbable tacks. This provided for good coverage with good overlap of the direct and indirect and femoral spaces.  The a was inspected and hemostasis was adequate. The carbon dioxide gas was released. The extraperitoneal contents approximate the mesh.  The right anterior rectus sheath defect was closed with interrupted 0 Vicryl sutures. The skin incisions were closed with 4-0  Monocryl subcuticular stitches. Steri-Strips and sterile dressings were applied.  He tolerated the procedure well without any apparent complications. He was taken to  the recovery room in satisfactory condition.

## 2011-04-02 NOTE — Progress Notes (Signed)
Patient unable to void feels like bladder is full. Bladder scan 368cc. MD on call notified. Order given for in and out cath and d/c patient home.

## 2011-04-05 ENCOUNTER — Encounter (HOSPITAL_COMMUNITY): Payer: Self-pay | Admitting: General Surgery

## 2011-04-06 ENCOUNTER — Telehealth (INDEPENDENT_AMBULATORY_CARE_PROVIDER_SITE_OTHER): Payer: Self-pay

## 2011-04-06 NOTE — Telephone Encounter (Signed)
The pt's wife called this morning to report that he was experiencing quite a bit of scrotal swelling.  His testicles were very dark and somewhat firm, and there was a lot of swelling around the tip of his penis.  I paged Dr. Abbey Chatters who assured me that this was normal postoperative swelling and may get worse before it begins to improve, and he should apply ice to the area  I called Mrs. Crumpler at work and explained this to her.  She will relay the instructions to the patient.

## 2011-04-14 ENCOUNTER — Emergency Department (HOSPITAL_COMMUNITY): Payer: BC Managed Care – PPO

## 2011-04-14 ENCOUNTER — Encounter (HOSPITAL_COMMUNITY): Payer: Self-pay | Admitting: *Deleted

## 2011-04-14 ENCOUNTER — Emergency Department (HOSPITAL_COMMUNITY)
Admission: EM | Admit: 2011-04-14 | Discharge: 2011-04-14 | Disposition: A | Discharge status: Home / Self Care | Payer: BC Managed Care – PPO | Attending: Emergency Medicine | Admitting: Emergency Medicine

## 2011-04-14 DIAGNOSIS — K219 Gastro-esophageal reflux disease without esophagitis: Secondary | ICD-10-CM | POA: Insufficient documentation

## 2011-04-14 DIAGNOSIS — R109 Unspecified abdominal pain: Secondary | ICD-10-CM | POA: Insufficient documentation

## 2011-04-14 DIAGNOSIS — F411 Generalized anxiety disorder: Secondary | ICD-10-CM | POA: Insufficient documentation

## 2011-04-14 LAB — DIFFERENTIAL
Basophils Absolute: 0 10*3/uL (ref 0.0–0.1)
Basophils Relative: 0 % (ref 0–1)
Eosinophils Absolute: 0.4 10*3/uL (ref 0.0–0.7)
Eosinophils Relative: 4 % (ref 0–5)
Lymphocytes Relative: 17 % (ref 12–46)
Lymphs Abs: 1.8 10*3/uL (ref 0.7–4.0)
Monocytes Absolute: 0.6 10*3/uL (ref 0.1–1.0)
Monocytes Relative: 5 % (ref 3–12)
Neutro Abs: 8.2 10*3/uL — ABNORMAL HIGH (ref 1.7–7.7)
Neutrophils Relative %: 74 % (ref 43–77)

## 2011-04-14 LAB — URINALYSIS, ROUTINE W REFLEX MICROSCOPIC
Bilirubin Urine: NEGATIVE
Glucose, UA: NEGATIVE mg/dL
Hgb urine dipstick: NEGATIVE
Ketones, ur: NEGATIVE mg/dL
Leukocytes, UA: NEGATIVE
Nitrite: NEGATIVE
Protein, ur: NEGATIVE mg/dL
Specific Gravity, Urine: 1.014 (ref 1.005–1.030)
Urobilinogen, UA: 0.2 mg/dL (ref 0.0–1.0)
pH: 7 (ref 5.0–8.0)

## 2011-04-14 LAB — CBC
MCHC: 34.1 g/dL (ref 30.0–36.0)
MCV: 92.1 fL (ref 78.0–100.0)
Platelets: 308 10*3/uL (ref 150–400)
RDW: 12.2 % (ref 11.5–15.5)
WBC: 11.1 10*3/uL — ABNORMAL HIGH (ref 4.0–10.5)

## 2011-04-14 LAB — BASIC METABOLIC PANEL
BUN: 9 mg/dL (ref 6–23)
Chloride: 97 mEq/L (ref 96–112)
Glucose, Bld: 98 mg/dL (ref 70–99)
Potassium: 4.1 mEq/L (ref 3.5–5.1)

## 2011-04-14 MED ORDER — SODIUM CHLORIDE 0.9 % IV SOLN
Freq: Once | INTRAVENOUS | Status: AC
  Administered 2011-04-14: 09:00:00 via INTRAVENOUS

## 2011-04-14 MED ORDER — CEPHALEXIN 500 MG PO CAPS: 500.0000 mg | ORAL_CAPSULE | Freq: Four times a day (QID) | ORAL | Status: AC

## 2011-04-14 MED ORDER — IOHEXOL 300 MG/ML  SOLN
100.0000 mL | Freq: Once | INTRAMUSCULAR | Status: AC | PRN
  Administered 2011-04-14: 100 mL via INTRAVENOUS

## 2011-04-14 MED ORDER — IOHEXOL 300 MG/ML  SOLN: 100.0000 mL | Freq: Once | INTRAMUSCULAR | Status: DC | PRN

## 2011-04-14 MED ORDER — OXYCODONE-ACETAMINOPHEN 5-325 MG PO TABS
2.0000 | ORAL_TABLET | ORAL | Status: AC | PRN
Start: 2011-04-14 — End: 2011-04-24

## 2011-04-14 NOTE — ED Notes (Signed)
Pt had bil hernia repair on Nov 12th, tested positive for nasal infection prior to surgery, treated with nasal antibiotics, Wed noted lumps under arms, amt has increased and pain in incision area more on rt side

## 2011-04-14 NOTE — ED Notes (Signed)
Pt finished contrast, notified CT

## 2011-04-14 NOTE — ED Notes (Signed)
Pt had bil hernia repair on Nov 12th, pain in incision areas, pt noted lumps under both arms starting on Wed, amt has increased along with level of pain, tested positive infection reading nasally prior to surgery, antibiotic treatment completed but not retested. Pain more on rt side

## 2011-04-14 NOTE — ED Notes (Signed)
Pt st's he had bilateral hernia repair on Nov 12th, now reports pain on both sides of abdomen.  Also there are bumps present under both arm pits that are painful, pt st's he's unable to raise either arm much.  Denies any n/v/d, denies any other signs of infection.  Denies any sob, denies chest pain.  Bowel sounds are normoactive in all quadrants.

## 2011-04-14 NOTE — ED Provider Notes (Signed)
History     CSN: [id number] Arrival date & time: 04/14/2011  8:23 AM   First MD Initiated Contact with Patient 04/14/11 [phone]      Chief Complaint  Patient presents with  . Abdominal Pain    (Consider location/radiation/quality/duration/timing/severity/associated sxs/prior treatment) HPI Comments: Recently had bilateral inguinal hernia repairs (10 days ago).  Was doing fine, now has pain.  No fever or vomiting.  Also complains of swollen areas in axillae.    Patient is a 45 y.o. male presenting with abdominal pain. The history is provided by the patient.  Abdominal Pain The primary symptoms of the illness include abdominal pain and fatigue. The primary symptoms of the illness do not include fever, nausea, vomiting, diarrhea or dysuria. The current episode started 2 days ago. The problem has been gradually worsening.  The patient states that she believes she is currently not pregnant. The patient has not had a change in bowel habit. Symptoms associated with the illness do not include chills, constipation, hematuria or frequency.    Past Medical History  Diagnosis Date  . Seasonal allergies   . Anxiety   . GERD (gastroesophageal reflux disease) 03-27-11    tx. Nexium    Past Surgical History  Procedure Date  . Nose surgery 200/2003/2008/2012  . Knee surgery 2009    right  . Shoulder surgery 2004    left  . Varicocele excision 2000  . Inguinal hernia repair 04/02/2011    Procedure: LAPAROSCOPIC BILATERAL INGUINAL HERNIA REPAIR;  Surgeon: Adolph Pollack, MD;  Location: [facility];  Service: General;  Laterality: Bilateral;  laparoscopic repair bilateral inguinal hernia    No family history on file.  History  Substance Use Topics  . Smoking status: Never Smoker   . Smokeless tobacco: Never Used  . Alcohol Use: No      Review of Systems  Constitutional: Positive for fatigue. Negative for fever and chills.  Gastrointestinal: Positive for abdominal pain. Negative for  nausea, vomiting, diarrhea and constipation.  Genitourinary: Negative for dysuria, frequency and hematuria.  All other systems reviewed and are negative.    Allergies  Eggs or egg-derived products; Amoxil; Banana; and Skelaxin  Home Medications   Current Outpatient Rx  Name Route Sig Dispense Refill  . AMBULATORY NON FORMULARY MEDICATION Intramuscular Inject into the muscle 2 (two) times a week. Medication Name:allergy shots    . CITALOPRAM HYDROBROMIDE 40 MG PO TABS Oral Take 40 mg by mouth every morning.     Marland Kitchen ESOMEPRAZOLE MAGNESIUM 40 MG PO CPDR Oral Take 40 mg by mouth daily before breakfast.       BP 127/90  Pulse 85  Temp(Src) 98.6 F (37 C) (Oral)  Resp 18  SpO2 98%  Physical Exam  Nursing note and vitals reviewed. Constitutional: He is oriented to person, place, and time. He appears well-developed and well-nourished. No distress.  HENT:  Head: Normocephalic and atraumatic.  Neck: Normal range of motion. Neck supple.  Cardiovascular: Normal rate and regular rhythm.  Exam reveals no gallop and no friction rub.   No murmur heard. Pulmonary/Chest: Effort normal and breath sounds normal. No respiratory distress.  Abdominal: Soft.       There is bruising to the abdomen and scrotum.  The incision sites appear well.  He is ttp in the inguinal region.  I see no redness or erythema.  No bulging.  Musculoskeletal: Normal range of motion. He exhibits no edema.  Neurological: He is alert and oriented to person, place, and  time.  Skin: He is not diaphoretic.    ED Course  Procedures (including critical care time)   Labs Reviewed  CBC  DIFFERENTIAL  BASIC METABOLIC PANEL   No results found.   No diagnosis found.    MDM  CT scan, labs look okay.  Will treat with pain meds, time.  Will also give keflex for the bumps under the arms.  I am unsure if this is folliculitis or reactive lymph nodes.  Spoke with Dr. Ezzard Standing who agrees with this course of action.  Follow up  with Dr. Purnell Shoemaker next week.        Geoffery Lyons, MD 04/14/11 [phone]

## 2011-04-16 ENCOUNTER — Telehealth (INDEPENDENT_AMBULATORY_CARE_PROVIDER_SITE_OTHER): Payer: Self-pay | Admitting: General Surgery

## 2011-04-16 NOTE — Telephone Encounter (Signed)
Wife called because husband was having abdominal pain on Saturday status post bilateral inguinal hernia repairs and was complaining of enlarged lymph nodes under both axillas. Per wife the abdominal pain is better but axillary pain is worse. "Knots" under right and left arm, they are bigger and more painful than Saturday in the ER. On antibiotics. No fever. No redness. No nausea or vomiting. Some chills. Paged Dr Abbey Chatters for advise. Per Dr Abbey Chatters he should see his primary care physician about his enlarged lymph nodes. I made wife aware and they will contact his PCP.

## 2011-04-18 ENCOUNTER — Other Ambulatory Visit: Payer: Self-pay | Admitting: Family Medicine

## 2011-04-18 DIAGNOSIS — R2230 Localized swelling, mass and lump, unspecified upper limb: Secondary | ICD-10-CM

## 2011-04-18 DIAGNOSIS — R748 Abnormal levels of other serum enzymes: Secondary | ICD-10-CM

## 2011-04-20 ENCOUNTER — Ambulatory Visit
Admission: RE | Admit: 2011-04-20 | Discharge: 2011-04-20 | Disposition: A | Discharge status: Home / Self Care | Payer: BC Managed Care – PPO | Source: Ambulatory Visit | Attending: Family Medicine | Admitting: Family Medicine

## 2011-04-20 DIAGNOSIS — R748 Abnormal levels of other serum enzymes: Secondary | ICD-10-CM

## 2011-04-25 ENCOUNTER — Encounter (INDEPENDENT_AMBULATORY_CARE_PROVIDER_SITE_OTHER): Payer: Self-pay | Admitting: General Surgery

## 2011-04-25 ENCOUNTER — Ambulatory Visit (INDEPENDENT_AMBULATORY_CARE_PROVIDER_SITE_OTHER): Payer: BC Managed Care – PPO | Admitting: General Surgery

## 2011-04-25 VITALS — BP 110/88 | HR 64 | Temp 98.3°F | Resp 12 | Ht 67.0 in | Wt 183.2 lb

## 2011-04-25 DIAGNOSIS — Z9889 Other specified postprocedural states: Secondary | ICD-10-CM

## 2011-04-25 NOTE — Patient Instructions (Signed)
Resume normal activities as tolerated in 3 weeks.

## 2011-04-25 NOTE — Progress Notes (Signed)
He presents for postop followup after laparoscopic bilateral inguinal hernia repair with mesh.  Post op pain is improving  He still has a little pain in the right groin.  No difficulty voiding or having BMs.  Swelling is decreasing.  P.E.  ABD:  Soft, incisions clean/dry/intact  GU:  Incision clean/dry/intact, swelling is minimal, repairs are solid.  Assessment:  Doing well post bilateral inguinal hernia repair.  Plan:  Continue light activities for 6 weeks postop then slowly start to resume normal activities.  Avoid strenous abdominal exercises for a total of 8 weeks from surgery.  Avoid activities that cause significant discomfort for the long term.  Return visit as needed.

## 2011-05-22 HISTORY — PX: WRIST SURGERY: SHX841

## 2011-05-30 ENCOUNTER — Telehealth: Payer: Self-pay | Admitting: *Deleted

## 2011-05-31 NOTE — Telephone Encounter (Signed)
Records were reviewed by Dr Jarold Motto and Dr Rhea Belton and both agree they have nothing futher to office the patient that Dr Madilyn Fireman has done a complete work up. I have tried to call patient multiple times and left messages, I have advised that patient of the decision and advised Dr Yehuda Budd office as well.

## 2011-06-25 ENCOUNTER — Other Ambulatory Visit: Payer: Self-pay | Admitting: Family Medicine

## 2011-06-25 DIAGNOSIS — R1011 Right upper quadrant pain: Secondary | ICD-10-CM

## 2011-06-26 ENCOUNTER — Ambulatory Visit
Admission: RE | Admit: 2011-06-26 | Discharge: 2011-06-26 | Disposition: A | Discharge status: Home / Self Care | Payer: BC Managed Care – PPO | Source: Ambulatory Visit | Attending: Family Medicine | Admitting: Family Medicine

## 2011-06-26 DIAGNOSIS — R1011 Right upper quadrant pain: Secondary | ICD-10-CM

## 2012-01-14 DIAGNOSIS — E785 Hyperlipidemia, unspecified: Secondary | ICD-10-CM | POA: Insufficient documentation

## 2012-01-28 ENCOUNTER — Other Ambulatory Visit: Payer: Self-pay | Admitting: Dermatology

## 2012-03-18 DIAGNOSIS — E291 Testicular hypofunction: Secondary | ICD-10-CM | POA: Insufficient documentation

## 2012-07-14 DIAGNOSIS — E559 Vitamin D deficiency, unspecified: Secondary | ICD-10-CM | POA: Insufficient documentation

## 2013-06-02 ENCOUNTER — Emergency Department (HOSPITAL_COMMUNITY): Payer: BC Managed Care – PPO

## 2013-06-02 ENCOUNTER — Emergency Department (HOSPITAL_COMMUNITY)
Admission: EM | Admit: 2013-06-02 | Discharge: 2013-06-02 | Disposition: A | Discharge status: Home / Self Care | Payer: BC Managed Care – PPO | Attending: Emergency Medicine | Admitting: Emergency Medicine

## 2013-06-02 DIAGNOSIS — K219 Gastro-esophageal reflux disease without esophagitis: Secondary | ICD-10-CM | POA: Insufficient documentation

## 2013-06-02 DIAGNOSIS — Z9889 Other specified postprocedural states: Secondary | ICD-10-CM | POA: Insufficient documentation

## 2013-06-02 DIAGNOSIS — M79609 Pain in unspecified limb: Secondary | ICD-10-CM | POA: Insufficient documentation

## 2013-06-02 DIAGNOSIS — Z8709 Personal history of other diseases of the respiratory system: Secondary | ICD-10-CM | POA: Insufficient documentation

## 2013-06-02 DIAGNOSIS — Z79899 Other long term (current) drug therapy: Secondary | ICD-10-CM | POA: Insufficient documentation

## 2013-06-02 DIAGNOSIS — F411 Generalized anxiety disorder: Secondary | ICD-10-CM | POA: Insufficient documentation

## 2013-06-02 DIAGNOSIS — M79603 Pain in arm, unspecified: Secondary | ICD-10-CM

## 2013-06-02 DIAGNOSIS — E7800 Pure hypercholesterolemia, unspecified: Secondary | ICD-10-CM | POA: Insufficient documentation

## 2013-06-02 LAB — CBC WITH DIFFERENTIAL/PLATELET
BASOS ABS: 0 10*3/uL (ref 0.0–0.1)
BASOS PCT: 0 % (ref 0–1)
Eosinophils Absolute: 0.5 10*3/uL (ref 0.0–0.7)
Eosinophils Relative: 7 % — ABNORMAL HIGH (ref 0–5)
HCT: 41.6 % (ref 39.0–52.0)
Hemoglobin: 15 g/dL (ref 13.0–17.0)
LYMPHS PCT: 46 % (ref 12–46)
Lymphs Abs: 3.6 10*3/uL (ref 0.7–4.0)
MCH: 32.3 pg (ref 26.0–34.0)
MCHC: 36.1 g/dL — AB (ref 30.0–36.0)
MCV: 89.5 fL (ref 78.0–100.0)
MONO ABS: 0.5 10*3/uL (ref 0.1–1.0)
Monocytes Relative: 7 % (ref 3–12)
NEUTROS ABS: 3.2 10*3/uL (ref 1.7–7.7)
Neutrophils Relative %: 41 % — ABNORMAL LOW (ref 43–77)
PLATELETS: 222 10*3/uL (ref 150–400)
RBC: 4.65 MIL/uL (ref 4.22–5.81)
RDW: 12.2 % (ref 11.5–15.5)
WBC: 7.9 10*3/uL (ref 4.0–10.5)

## 2013-06-02 LAB — COMPREHENSIVE METABOLIC PANEL
ALBUMIN: 4.2 g/dL (ref 3.5–5.2)
ALT: 34 U/L (ref 0–53)
AST: 37 U/L (ref 0–37)
Alkaline Phosphatase: 112 U/L (ref 39–117)
BUN: 13 mg/dL (ref 6–23)
CALCIUM: 9.1 mg/dL (ref 8.4–10.5)
CHLORIDE: 100 meq/L (ref 96–112)
CO2: 27 meq/L (ref 19–32)
CREATININE: 1.11 mg/dL (ref 0.50–1.35)
GFR calc Af Amer: >90 mL/min (ref >90)
GFR, EST NON AFRICAN AMERICAN: 78 mL/min — AB (ref >90)
Glucose, Bld: 100 mg/dL — ABNORMAL HIGH (ref 70–99)
Potassium: 4.1 mEq/L (ref 3.7–5.3)
SODIUM: 138 meq/L (ref 137–147)
Total Bilirubin: 0.3 mg/dL (ref 0.3–1.2)
Total Protein: 7.5 g/dL (ref 6.0–8.3)

## 2013-06-02 LAB — POCT I-STAT TROPONIN I
TROPONIN I, POC: 0 ng/mL (ref 0.00–0.08)
Troponin i, poc: 0 ng/mL (ref 0.00–0.08)

## 2013-06-02 MED ORDER — IBUPROFEN 800 MG PO TABS
800.0000 mg | ORAL_TABLET | Freq: Once | ORAL | Status: AC
  Administered 2013-06-02: 800 mg via ORAL
  Filled 2013-06-02: qty 1

## 2013-06-02 NOTE — ED Notes (Signed)
Pt st's he was having pain in left upper arm this am but subsided.  St's tonight while at rest he developed pain again in left arm.  Pt denies any chest pain

## 2013-06-02 NOTE — ED Notes (Signed)
Patient transported to CT 

## 2013-06-02 NOTE — ED Provider Notes (Signed)
CSN: [id number]     Arrival date & time 06/02/13  2023 History   First MD Initiated Contact with Patient 06/02/13 2132     Chief Complaint  Patient presents with  . Arm Pain   HPI  History provided by the patient. Patient is a 48 year old male with history of hypercholesterolemia, borderline hypertension, GERD, left shoulder surgery and anxiety who presents with complaints of pain to his left upper arm. Patient reports having a sharp burning pain in his left anterior arm and biceps area that began earlier in the day. He went to work and symptoms resolved. Later when he returned home he was sitting relaxed and symptoms have returned. Symptoms began around 8 PM. He did take aspirin without any relief. He denies having any associated chest pain, shortness of breath, heart palpitations, diaphoresis or nausea. Pain is not worse with any movements or activity. He denies any pain from the neck or change pain with neck movement. Denies having similar symptoms previously. No other aggravating or alleviating factors. No other associated symptoms.    Past Medical History  Diagnosis Date  . Seasonal allergies   . Anxiety   . GERD (gastroesophageal reflux disease) 03-27-11    tx. Nexium  . History of inguinal hernia repair     BIH   Past Surgical History  Procedure Laterality Date  . Nose surgery  200/2003/2008/2012  . Knee surgery  2009    right  . Shoulder surgery  2004    left  . Varicocele excision  2000  . Inguinal hernia repair  04/02/2011    Procedure: LAPAROSCOPIC BILATERAL INGUINAL HERNIA REPAIR;  Surgeon: Adolph Pollackodd J Rosenbower, MD;  Location: [facility];  Service: General;  Laterality: Bilateral;  laparoscopic repair bilateral inguinal hernia   No family history on file. History  Substance Use Topics  . Smoking status: Never Smoker   . Smokeless tobacco: Never Used  . Alcohol Use: No    Review of Systems  Constitutional: Negative for fever, chills and diaphoresis.  Respiratory: Negative  for cough and shortness of breath.   Cardiovascular: Negative for chest pain and palpitations.  Gastrointestinal: Negative for nausea and vomiting.  All other systems reviewed and are negative.    Allergies  Eggs or egg-derived products; Amoxil; Banana; and Skelaxin  Home Medications   Current Outpatient Rx  Name  Route  Sig  Dispense  Refill  . AMBULATORY NON FORMULARY MEDICATION   Intramuscular   Inject into the muscle 2 (two) times a week. Medication Name:allergy shots         . buPROPion (WELLBUTRIN XL) 150 MG 24 hr tablet   Oral   Take 150 mg by mouth daily.         Marland Kitchen. esomeprazole (NEXIUM) 40 MG capsule   Oral   Take 40 mg by mouth daily before breakfast.          . pravastatin (PRAVACHOL) 80 MG tablet   Oral   Take 80 mg by mouth daily.          BP 162/95  Pulse 76  Temp(Src) 98.5 F (36.9 C) (Oral)  Resp 16  Ht 5\' 7"  (1.702 m)  Wt 185 lb (83.915 kg)  BMI 28.97 kg/m2  SpO2 100% Physical Exam  Nursing note and vitals reviewed. Constitutional: He is oriented to person, place, and time. He appears well-developed and well-nourished. No distress.  HENT:  Head: Normocephalic and atraumatic.  Mouth/Throat: Oropharynx is clear and moist.  Neck: Normal range of  motion. Neck supple.  No cervical midline tenderness  Cardiovascular: Normal rate and regular rhythm.   No murmur heard. Pulmonary/Chest: Effort normal and breath sounds normal. No respiratory distress. He has no wheezes. He has no rales. He exhibits no tenderness.  Abdominal: Soft. There is no tenderness. There is no rebound.  Musculoskeletal: Normal range of motion. He exhibits no edema and no tenderness.  Neurological: He is alert and oriented to person, place, and time.  Skin: Skin is warm and dry. No rash noted.  Psychiatric: He has a normal mood and affect. His behavior is normal.    ED Course  Procedures   DIAGNOSTIC STUDIES: Oxygen Saturation is 100% on room air.    COORDINATION OF  CARE:  Nursing notes reviewed. Vital signs reviewed. Initial pt interview and examination performed.   10:50 PM-patient seen and evaluated. He is well-appearing no acute distress. Does not appear severely ill or toxic. Symptoms are atypical for ACS. Patient is low risk with heart score of 2 and is low risk Discussed work up plan with pt at bedside, which includes lab testing, chest x-ray and EKGs. Pt agrees with plan.  Lab tests unremarkable. Patient with negative troponin x2. Unremarkable EKGs without any changing.  Chest x-ray reviewed. There is slight widening of the left a.c. joint however patient has prior history of surgery. There is no tenderness or signs of muscle skeletal cause of his pain today.  Treatment plan initiated: Medications  ibuprofen (ADVIL,MOTRIN) tablet 800 mg (800 mg Oral Given 06/02/13 2209)   Results for orders placed during the hospital encounter of 06/02/13  CBC WITH DIFFERENTIAL      Result Value Range   WBC 7.9  4.0 - 10.5 K/uL   RBC 4.65  4.22 - 5.81 MIL/uL   Hemoglobin 15.0  13.0 - 17.0 g/dL   HCT 16.1  09.6 - 04.5 %   MCV 89.5  78.0 - 100.0 fL   MCH 32.3  26.0 - 34.0 pg   MCHC 36.1 (*) 30.0 - 36.0 g/dL   RDW 40.9  81.1 - 91.4 %   Platelets 222  150 - 400 K/uL   Neutrophils Relative % 41 (*) 43 - 77 %   Neutro Abs 3.2  1.7 - 7.7 K/uL   Lymphocytes Relative 46  12 - 46 %   Lymphs Abs 3.6  0.7 - 4.0 K/uL   Monocytes Relative 7  3 - 12 %   Monocytes Absolute 0.5  0.1 - 1.0 K/uL   Eosinophils Relative 7 (*) 0 - 5 %   Eosinophils Absolute 0.5  0.0 - 0.7 K/uL   Basophils Relative 0  0 - 1 %   Basophils Absolute 0.0  0.0 - 0.1 K/uL  COMPREHENSIVE METABOLIC PANEL      Result Value Range   Sodium 138  137 - 147 mEq/L   Potassium 4.1  3.7 - 5.3 mEq/L   Chloride 100  96 - 112 mEq/L   CO2 27  19 - 32 mEq/L   Glucose, Bld 100 (*) 70 - 99 mg/dL   BUN 13  6 - 23 mg/dL   Creatinine, Ser 7.82  0.50 - 1.35 mg/dL   Calcium 9.1  8.4 - 95.6 mg/dL   Total  Protein 7.5  6.0 - 8.3 g/dL   Albumin 4.2  3.5 - 5.2 g/dL   AST 37  0 - 37 U/L   ALT 34  0 - 53 U/L   Alkaline Phosphatase 112  39 -  117 U/L   Total Bilirubin 0.3  0.3 - 1.2 mg/dL   GFR calc non Af Amer 78 (*) >90 mL/min   GFR calc Af Amer >90  >90 mL/min  POCT I-STAT TROPONIN I      Result Value Range   Troponin i, poc 0.00  0.00 - 0.08 ng/mL   Comment 3           POCT I-STAT TROPONIN I      Result Value Range   Troponin i, poc 0.00  0.00 - 0.08 ng/mL   Comment 3               Imaging Review Dg Chest 2 View  06/02/2013   CLINICAL DATA:  Left arm pain.  Hypertension  EXAM: CHEST  2 VIEW  COMPARISON:  None available.  FINDINGS: Normal heart size and mediastinal contours. No acute infiltrate or edema. No effusion or pneumothorax. Nonspecific widening of the left acromioclavicular joint.  IMPRESSION: 1. No active cardiopulmonary disease. 2. Left acromioclavicular joint widening.   Electronically Signed   By: Tiburcio Pea M.D.   On: 06/02/2013 22:59    Date: 06/02/2013 20:38  Rate:72  Rhythm: normal sinus rhythm  QRS Axis: normal  Intervals: normal  ST/T Wave abnormalities: nonspecific T wave changes  Conduction Disutrbances:none  Narrative Interpretation: flat T wave in lead III  Old EKG Reviewed: none available    Date: 06/02/2013 22:59  Rate: 63  Rhythm: normal sinus rhythm  QRS Axis: normal  Intervals: normal  ST/T Wave abnormalities: nonspecific T wave changes  Conduction Disutrbances:none  Narrative Interpretation: flat T wave in lead III  Old EKG Reviewed: unchanged     MDM   1. Arm pain        Angus Seller, PA-C 06/02/13 2320

## 2013-06-02 NOTE — Discharge Instructions (Signed)
Your lab testing, EKG of your heart and chest x-ray has not shown any signs for a concerning or emergent cause of your arm pain. Please followup with your primary care provider tomorrow for continued evaluation and treatment. Return at any time for changing or worsening symptoms.     Pain of Unknown Etiology (Pain Without a Known Cause) You have come to your caregiver because of pain. Pain can occur in any part of the body. Often there is not a definite cause. If your laboratory (blood or urine) work was normal and X-rays or other studies were normal, your caregiver may treat you without knowing the cause of the pain. An example of this is the headache. Most headaches are diagnosed by taking a history. This means your caregiver asks you questions about your headaches. Your caregiver determines a treatment based on your answers. Usually testing done for headaches is normal. Often testing is not done unless there is no response to medications. Regardless of where your pain is located today, you can be given medications to make you comfortable. If no physical cause of pain can be found, most cases of pain will gradually leave as suddenly as they came.  If you have a painful condition and no reason can be found for the pain, it is important that you follow up with your caregiver. If the pain becomes worse or does not go away, it may be necessary to repeat tests and look further for a possible cause.  Only take over-the-counter or prescription medicines for pain, discomfort, or fever as directed by your caregiver.  For the protection of your privacy, test results cannot be given over the phone. Make sure you receive the results of your test. Ask how these results are to be obtained if you have not been informed. It is your responsibility to obtain your test results.  You may continue all activities unless the activities cause more pain. When the pain lessens, it is important to gradually resume normal  activities. Resume activities by beginning slowly and gradually increasing the intensity and duration of the activities or exercise. During periods of severe pain, bed rest may be helpful. Lie or sit in any position that is comfortable.  Ice used for acute (sudden) conditions may be effective. Use a large plastic bag filled with ice and wrapped in a towel. This may provide pain relief.  See your caregiver for continued problems. Your caregiver can help or refer you for exercises or physical therapy if necessary. If you were given medications for your condition, do not drive, operate machinery or power tools, or sign legal documents for 24 hours. Do not drink alcohol, take sleeping pills, or take other medications that may interfere with treatment. See your caregiver immediately if you have pain that is becoming worse and not relieved by medications. Document Released: 01/30/2001 Document Revised: 02/25/2013 Document Reviewed: 05/07/2005 [facility] Patient Information 2014 [employer] [place].

## 2013-06-06 NOTE — ED Provider Notes (Signed)
Medical screening examination/treatment/procedure(s) were performed by non-physician practitioner and as supervising physician I was immediately available for consultation/collaboration.  EKG Interpretation    Date/Time:  Tuesday June 02 2013 22:59:55 EST Ventricular Rate:  63 PR Interval:  176 QRS Duration: 97 QT Interval:  405 QTC Calculation: 415 R Axis:   51 Text Interpretation:  Sinus rhythm ED PHYSICIAN INTERPRETATION AVAILABLE IN [facility] Confirmed by TEST, RECORD ([id number]) on 06/04/2013 12:00:02 PM              Rolland PorterMark Mikenzie Mccannon, MD 06/06/13 [phone]

## 2014-02-25 ENCOUNTER — Other Ambulatory Visit (INDEPENDENT_AMBULATORY_CARE_PROVIDER_SITE_OTHER): Payer: Self-pay | Admitting: General Surgery

## 2014-02-25 DIAGNOSIS — R1031 Right lower quadrant pain: Secondary | ICD-10-CM

## 2014-03-05 ENCOUNTER — Other Ambulatory Visit: Payer: BC Managed Care – PPO

## 2014-03-08 ENCOUNTER — Ambulatory Visit
Admission: RE | Admit: 2014-03-08 | Discharge: 2014-03-08 | Disposition: A | Discharge status: Home / Self Care | Payer: BC Managed Care – PPO | Source: Ambulatory Visit | Attending: General Surgery | Admitting: General Surgery

## 2014-03-08 DIAGNOSIS — R1031 Right lower quadrant pain: Secondary | ICD-10-CM

## 2014-03-08 MED ORDER — IOHEXOL 300 MG/ML  SOLN
100.0000 mL | Freq: Once | INTRAMUSCULAR | Status: AC | PRN
  Administered 2014-03-08: 100 mL via INTRAVENOUS

## 2014-05-04 ENCOUNTER — Telehealth (INDEPENDENT_AMBULATORY_CARE_PROVIDER_SITE_OTHER): Payer: Self-pay

## 2014-05-04 NOTE — Telephone Encounter (Signed)
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 He is asking if this can be sent in as a prescription so it does not cost as much. Please advise if ok to fill and the directions if so.

## 2014-05-04 NOTE — Telephone Encounter (Signed)
Ibuprofen 600 mg sig: 1 po bid with food.  Disp #60, refill =1

## 2014-11-05 DIAGNOSIS — I10 Essential (primary) hypertension: Secondary | ICD-10-CM | POA: Insufficient documentation

## 2015-05-26 ENCOUNTER — Ambulatory Visit
Admission: RE | Admit: 2015-05-26 | Discharge: 2015-05-26 | Disposition: A | Discharge status: Home / Self Care | Payer: BLUE CROSS/BLUE SHIELD | Source: Ambulatory Visit | Attending: Family Medicine | Admitting: Family Medicine

## 2015-05-26 ENCOUNTER — Other Ambulatory Visit: Payer: Self-pay | Admitting: Family Medicine

## 2015-05-26 DIAGNOSIS — M25562 Pain in left knee: Secondary | ICD-10-CM

## 2015-09-22 ENCOUNTER — Ambulatory Visit: Payer: Self-pay | Admitting: Orthopedic Surgery

## 2015-09-22 NOTE — Progress Notes (Signed)
Preoperative surgical orders have been place into the Epic hospital system for Jeremy DurhamBrian T Najarian on 09/22/2015, 10:33 AM  by Patrica DuelPERKINS, ALEXZANDREW for surgery on 10-12-15.  Preop Knee Scope orders including IV Tylenol and IV Decadron as long as there are no contraindications to the above medications. Avel Peacerew Perkins, PA-C

## 2015-10-07 NOTE — Patient Instructions (Signed)
Jeremy Diaz  10/07/2015   Your procedure is scheduled on: 10/12/15  [facility] to 3rd floor to Short Stay Center at 7:00 AM.  Call this number if you have problems the morning of surgery [phone]   Remember: ONLY 1 PERSON MAY GO WITH YOU TO SHORT STAY TO GET  READY MORNING OF YOUR SURGERY.  Do not eat food or drink liquids :After Midnight.     Take these medicines the morning of surgery with A SIP OF WATER: Nexium, Wellbutrin,Pravastatin                                            Do not wear jewelry,  lotions, powders or deodorant                           Men may shave face and neck.   Do not bring valuables to the hospital.  IS NOT             RESPONSIBLE   FOR VALUABLES.  Contacts, dentures or bridgework may not be worn into surgery.       Patients discharged the day of surgery will not be allowed to drive home.  Name and phone number of your driver:            _____________________________________________________________________             [facility]  Before surgery, you can play an important role.  Because skin is not sterile, your skin needs to be as free of germs as possible.  You can reduce the number of germs on you skin by washing with CHG (chlorahexidine gluconate) soap before surgery.  CHG is an antiseptic cleaner which kills germs and bonds with the skin to continue killing germs even after washing.  Please DO NOT use if you have an allergy to CHG or antibacterial soaps.  If your skin becomes reddened/irritated stop using the CHG and inform your nurse when you arrive at Short Stay.  Do not shave (including legs and underarms) for at least 48 hours prior to the first CHG shower.  You may shave your face.  Please follow these instructions carefully:   1.  Shower with CHG Soap the night before surgery and the                                morning of  Surgery.  2.  If you choose to wash your hair, wash your hair first as usual with your       normal shampoo.  3.  After you shampoo, rinse your hair and body thoroughly to remove the                      Shampoo.  4.  Use CHG as you would any other liquid soap.  You can apply chg directly       to the skin and wash gently with scrungie or a clean washcloth.  5.  Apply the CHG Soap to your body ONLY FROM THE NECK DOWN.        Do not use  on open wounds or open sores.  Avoid contact with your eyes,       ears, mouth and genitals (private parts).  Wash genitals (private parts)       with your normal soap.  6.  Wash thoroughly, paying special attention to the area where your surgery        will be performed.  7.  Thoroughly rinse your body with warm water from the neck down.  8.  DO NOT shower/wash with your normal soap after using and rinsing off       the CHG Soap.  9.  Pat yourself dry with a clean towel.            10.  Wear clean pajamas.            11.  Place clean sheets on your bed the night of your first shower and do not        sleep with pets.  Day of Surgery  Do not apply any lotions/deoderants the morning of surgery.  Please wear clean clothes to the hospital/surgery center.    Incentive Spirometer  An incentive spirometer is a tool that can help keep your lungs clear and active. This tool measures how well you are filling your lungs with each breath. Taking long deep breaths may help reverse or decrease the chance of developing breathing (pulmonary) problems (especially infection) following:  A long period of time when you are unable to move or be active. BEFORE THE PROCEDURE   If the spirometer includes an indicator to show your best effort, your nurse or respiratory therapist will set it to a desired goal.  If possible, sit up straight or lean slightly forward. Try not to slouch.  Hold the incentive spirometer in an upright position. INSTRUCTIONS FOR USE  1. Sit on the edge  of your bed if possible, or sit up as far as you can in bed or on a chair. 2. Hold the incentive spirometer in an upright position. 3. Breathe out normally. 4. Place the mouthpiece in your mouth and seal your lips tightly around it. 5. Breathe in slowly and as deeply as possible, raising the piston or the ball toward the top of the column. 6. Hold your breath for 3-5 seconds or for as long as possible. Allow the piston or ball to fall to the bottom of the column. 7. Remove the mouthpiece from your mouth and breathe out normally. 8. Rest for a few seconds and repeat Steps 1 through 7 at least 10 times every 1-2 hours when you are awake. Take your time and take a few normal breaths between deep breaths. 9. The spirometer may include an indicator to show your best effort. Use the indicator as a goal to work toward during each repetition. 10. After each set of 10 deep breaths, practice coughing to be sure your lungs are clear. If you have an incision (the cut made at the time of surgery), support your incision when coughing by placing a pillow or rolled up towels firmly against it. Once you are able to get out of bed, walk around indoors and cough well. You may stop using the incentive spirometer when instructed by your caregiver.  RISKS AND COMPLICATIONS  Take your time so you do not get dizzy or light-headed.  If you are in pain, you may need to take or ask for pain medication before doing incentive spirometry. It is harder to take a deep breath if you are  having pain. AFTER USE  Rest and breathe slowly and easily.  It can be helpful to keep track of a log of your progress. Your caregiver can provide you with a simple table to help with this. If you are using the spirometer at home, follow these instructions: SEEK MEDICAL CARE IF:   You are having difficultly using the spirometer.  You have trouble using the spirometer as often as instructed.  Your pain medication is not giving enough  relief while using the spirometer.  You develop fever of 100.5 F (38.1 C) or higher. SEEK IMMEDIATE MEDICAL CARE IF:   You cough up bloody sputum that had not been present before.  You develop fever of 102 F (38.9 C) or greater.  You develop worsening pain at or near the incision site. MAKE SURE YOU:   Understand these instructions.  Will watch your condition.  Will get help right away if you are not doing well or get worse. Document Released: 09/17/2006 Document Revised: 07/30/2011 Document Reviewed: 11/18/2006 [facility] Patient Information 2014 [place], [place].   ________________________________________________________________________

## 2015-10-10 ENCOUNTER — Encounter (HOSPITAL_COMMUNITY): Payer: Self-pay

## 2015-10-10 ENCOUNTER — Encounter (HOSPITAL_COMMUNITY)
Admission: RE | Admit: 2015-10-10 | Discharge: 2015-10-10 | Disposition: A | Discharge status: Home / Self Care | Payer: BLUE CROSS/BLUE SHIELD | Source: Ambulatory Visit | Attending: Orthopedic Surgery | Admitting: Orthopedic Surgery

## 2015-10-10 ENCOUNTER — Other Ambulatory Visit: Payer: Self-pay

## 2015-10-10 DIAGNOSIS — S83242A Other tear of medial meniscus, current injury, left knee, initial encounter: Secondary | ICD-10-CM | POA: Diagnosis not present

## 2015-10-10 DIAGNOSIS — Z79899 Other long term (current) drug therapy: Secondary | ICD-10-CM | POA: Diagnosis not present

## 2015-10-10 DIAGNOSIS — M2242 Chondromalacia patellae, left knee: Secondary | ICD-10-CM | POA: Diagnosis not present

## 2015-10-10 DIAGNOSIS — I10 Essential (primary) hypertension: Secondary | ICD-10-CM | POA: Diagnosis not present

## 2015-10-10 DIAGNOSIS — X58XXXA Exposure to other specified factors, initial encounter: Secondary | ICD-10-CM | POA: Diagnosis not present

## 2015-10-10 DIAGNOSIS — K219 Gastro-esophageal reflux disease without esophagitis: Secondary | ICD-10-CM | POA: Diagnosis not present

## 2015-10-10 LAB — CBC
HEMATOCRIT: 40.8 % (ref 39.0–52.0)
HEMOGLOBIN: 14.5 g/dL (ref 13.0–17.0)
MCH: 32.4 pg (ref 26.0–34.0)
MCHC: 35.5 g/dL (ref 30.0–36.0)
MCV: 91.1 fL (ref 78.0–100.0)
Platelets: 231 10*3/uL (ref 150–400)
RBC: 4.48 MIL/uL (ref 4.22–5.81)
RDW: 11.9 % (ref 11.5–15.5)
WBC: 7.2 10*3/uL (ref 4.0–10.5)

## 2015-10-10 LAB — BASIC METABOLIC PANEL
ANION GAP: 7 (ref 5–15)
BUN: 13 mg/dL (ref 6–20)
CALCIUM: 9.2 mg/dL (ref 8.9–10.3)
CO2: 28 mmol/L (ref 22–32)
Chloride: 101 mmol/L (ref 101–111)
Creatinine, Ser: 1.1 mg/dL (ref 0.61–1.24)
GFR calc non Af Amer: >60 mL/min (ref >60)
GLUCOSE: 102 mg/dL — AB (ref 65–99)
POTASSIUM: 3.7 mmol/L (ref 3.5–5.1)
Sodium: 136 mmol/L (ref 135–145)

## 2015-10-10 LAB — SURGICAL PCR SCREEN
MRSA, PCR: NEGATIVE
STAPHYLOCOCCUS AUREUS: NEGATIVE

## 2015-10-11 NOTE — Anesthesia Preprocedure Evaluation (Addendum)
Anesthesia Evaluation  Patient identified by MRN, date of birth, ID band Patient awake    Reviewed: Allergy & Precautions, NPO status , Patient's Chart, lab work & pertinent test results  Airway Mallampati: II  TM Distance: >3 FB Neck ROM: Full    Dental no notable dental hx.    Pulmonary neg pulmonary ROS,    Pulmonary exam normal breath sounds clear to auscultation       Cardiovascular hypertension, Pt. on medications Normal cardiovascular exam Rhythm:Regular Rate:Normal     Neuro/Psych Anxiety negative neurological ROS     GI/Hepatic Neg liver ROS, GERD  Medicated,  Endo/Other  negative endocrine ROS  Renal/GU negative Renal ROS  negative genitourinary   Musculoskeletal negative musculoskeletal ROS (+)   Abdominal   Peds negative pediatric ROS (+)  Hematology negative hematology ROS (+)   Anesthesia Other Findings   Reproductive/Obstetrics negative OB ROS                            Anesthesia Physical Anesthesia Plan  ASA: II  Anesthesia Plan: General   Post-op Pain Management:    Induction: Intravenous  Airway Management Planned: LMA  Additional Equipment:   Intra-op Plan:   Post-operative Plan: Extubation in OR  Informed Consent: I have reviewed the patients History and Physical, chart, labs and discussed the procedure including the risks, benefits and alternatives for the proposed anesthesia with the patient or authorized representative who has indicated his/her understanding and acceptance.   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 Will avoid propofol. Did fine with Etomidate in 2012.)       Anesthesia Quick Evaluation

## 2015-10-12 ENCOUNTER — Encounter (HOSPITAL_COMMUNITY)
Admission: RE | Disposition: A | Discharge status: Home / Self Care | Payer: Self-pay | Source: Ambulatory Visit | Attending: Orthopedic Surgery

## 2015-10-12 ENCOUNTER — Ambulatory Visit (HOSPITAL_COMMUNITY): Payer: BLUE CROSS/BLUE SHIELD | Admitting: Anesthesiology

## 2015-10-12 ENCOUNTER — Encounter (HOSPITAL_COMMUNITY): Payer: Self-pay

## 2015-10-12 ENCOUNTER — Ambulatory Visit (HOSPITAL_COMMUNITY)
Admission: RE | Admit: 2015-10-12 | Discharge: 2015-10-12 | Disposition: A | Discharge status: Home / Self Care | Payer: BLUE CROSS/BLUE SHIELD | Source: Ambulatory Visit | Attending: Orthopedic Surgery | Admitting: Orthopedic Surgery

## 2015-10-12 DIAGNOSIS — I10 Essential (primary) hypertension: Secondary | ICD-10-CM | POA: Insufficient documentation

## 2015-10-12 DIAGNOSIS — S83242A Other tear of medial meniscus, current injury, left knee, initial encounter: Secondary | ICD-10-CM | POA: Insufficient documentation

## 2015-10-12 DIAGNOSIS — M2242 Chondromalacia patellae, left knee: Secondary | ICD-10-CM | POA: Insufficient documentation

## 2015-10-12 DIAGNOSIS — K219 Gastro-esophageal reflux disease without esophagitis: Secondary | ICD-10-CM | POA: Insufficient documentation

## 2015-10-12 DIAGNOSIS — S83242D Other tear of medial meniscus, current injury, left knee, subsequent encounter: Secondary | ICD-10-CM

## 2015-10-12 DIAGNOSIS — X58XXXA Exposure to other specified factors, initial encounter: Secondary | ICD-10-CM | POA: Insufficient documentation

## 2015-10-12 DIAGNOSIS — S83249A Other tear of medial meniscus, current injury, unspecified knee, initial encounter: Secondary | ICD-10-CM | POA: Diagnosis present

## 2015-10-12 DIAGNOSIS — Z79899 Other long term (current) drug therapy: Secondary | ICD-10-CM | POA: Insufficient documentation

## 2015-10-12 HISTORY — PX: KNEE ARTHROSCOPY: SHX127

## 2015-10-12 SURGERY — ARTHROSCOPY, KNEE
Anesthesia: General | Site: Knee | Laterality: Left

## 2015-10-12 MED ORDER — SUCCINYLCHOLINE CHLORIDE 20 MG/ML IJ SOLN
INTRAMUSCULAR | Status: DC | PRN
  Administered 2015-10-12: 100 mg via INTRAVENOUS

## 2015-10-12 MED ORDER — CEFAZOLIN SODIUM-DEXTROSE 2-4 GM/100ML-% IV SOLN
INTRAVENOUS | Status: AC
  Filled 2015-10-12: qty 100

## 2015-10-12 MED ORDER — CEFAZOLIN SODIUM-DEXTROSE 2-4 GM/100ML-% IV SOLN
2.0000 g | INTRAVENOUS | Status: AC
  Administered 2015-10-12: 2 g via INTRAVENOUS
  Filled 2015-10-12: qty 100

## 2015-10-12 MED ORDER — FENTANYL CITRATE (PF) 100 MCG/2ML IJ SOLN
25.0000 ug | INTRAMUSCULAR | Status: DC | PRN
  Administered 2015-10-12 (×2): 50 ug via INTRAVENOUS

## 2015-10-12 MED ORDER — SODIUM CHLORIDE 0.9 % IV SOLN: INTRAVENOUS | Status: DC

## 2015-10-12 MED ORDER — STERILE WATER FOR IRRIGATION IR SOLN
Status: DC | PRN
  Administered 2015-10-12: 1000 mL

## 2015-10-12 MED ORDER — FENTANYL CITRATE (PF) 100 MCG/2ML IJ SOLN
INTRAMUSCULAR | Status: DC | PRN
  Administered 2015-10-12 (×2): 50 ug via INTRAVENOUS

## 2015-10-12 MED ORDER — ACETAMINOPHEN 10 MG/ML IV SOLN
1000.0000 mg | Freq: Once | INTRAVENOUS | Status: AC
  Administered 2015-10-12: 1000 mg via INTRAVENOUS

## 2015-10-12 MED ORDER — METHOCARBAMOL 500 MG PO TABS: 500.0000 mg | ORAL_TABLET | Freq: Four times a day (QID) | ORAL | Status: DC

## 2015-10-12 MED ORDER — POVIDONE-IODINE 10 % EX SWAB: 2.0000 "application " | Freq: Once | CUTANEOUS | Status: DC

## 2015-10-12 MED ORDER — ETOMIDATE 2 MG/ML IV SOLN
INTRAVENOUS | Status: AC
  Filled 2015-10-12: qty 10

## 2015-10-12 MED ORDER — DEXAMETHASONE SODIUM PHOSPHATE 10 MG/ML IJ SOLN: 10.0000 mg | Freq: Once | INTRAMUSCULAR | Status: DC

## 2015-10-12 MED ORDER — MIDAZOLAM HCL 2 MG/2ML IJ SOLN
INTRAMUSCULAR | Status: AC
  Filled 2015-10-12: qty 2

## 2015-10-12 MED ORDER — FENTANYL CITRATE (PF) 100 MCG/2ML IJ SOLN
INTRAMUSCULAR | Status: AC
  Filled 2015-10-12: qty 2

## 2015-10-12 MED ORDER — ACETAMINOPHEN 10 MG/ML IV SOLN
INTRAVENOUS | Status: AC
  Filled 2015-10-12: qty 100

## 2015-10-12 MED ORDER — PROCHLORPERAZINE EDISYLATE 5 MG/ML IJ SOLN
10.0000 mg | Freq: Once | INTRAMUSCULAR | Status: DC | PRN
Start: 2015-10-12 — End: 2015-10-12

## 2015-10-12 MED ORDER — MIDAZOLAM HCL 5 MG/5ML IJ SOLN
INTRAMUSCULAR | Status: DC | PRN
  Administered 2015-10-12: 2 mg via INTRAVENOUS

## 2015-10-12 MED ORDER — ONDANSETRON HCL 4 MG/2ML IJ SOLN
INTRAMUSCULAR | Status: DC | PRN
  Administered 2015-10-12: 4 mg via INTRAVENOUS

## 2015-10-12 MED ORDER — HYDROCODONE-ACETAMINOPHEN 5-325 MG PO TABS
1.0000 | ORAL_TABLET | ORAL | Status: DC | PRN
  Administered 2015-10-12: 2 via ORAL
  Filled 2015-10-12: qty 2

## 2015-10-12 MED ORDER — HYDROCODONE-ACETAMINOPHEN 5-325 MG PO TABS: 1.0000 | ORAL_TABLET | ORAL | Status: DC | PRN

## 2015-10-12 MED ORDER — BUPIVACAINE-EPINEPHRINE 0.25% -1:200000 IJ SOLN
INTRAMUSCULAR | Status: DC | PRN
  Administered 2015-10-12: 30 mL

## 2015-10-12 MED ORDER — DEXAMETHASONE SODIUM PHOSPHATE 4 MG/ML IJ SOLN
INTRAMUSCULAR | Status: DC | PRN
  Administered 2015-10-12: 10 mg via INTRAVENOUS

## 2015-10-12 MED ORDER — LACTATED RINGERS IR SOLN
Status: DC | PRN
  Administered 2015-10-12: 6000 mL

## 2015-10-12 MED ORDER — PROPOFOL 10 MG/ML IV BOLUS
INTRAVENOUS | Status: AC
  Filled 2015-10-12: qty 20

## 2015-10-12 MED ORDER — LACTATED RINGERS IV SOLN
INTRAVENOUS | Status: DC | PRN
  Administered 2015-10-12: 08:00:00 via INTRAVENOUS

## 2015-10-12 MED ORDER — CHLORHEXIDINE GLUCONATE 4 % EX LIQD: 60.0000 mL | Freq: Once | CUTANEOUS | Status: DC

## 2015-10-12 MED ORDER — METOCLOPRAMIDE HCL 5 MG/ML IJ SOLN
INTRAMUSCULAR | Status: DC | PRN
  Administered 2015-10-12: 10 mg via INTRAVENOUS

## 2015-10-12 MED ORDER — LIDOCAINE HCL (CARDIAC) 20 MG/ML IV SOLN
INTRAVENOUS | Status: DC | PRN
  Administered 2015-10-12: 50 mg via INTRAVENOUS

## 2015-10-12 MED ORDER — ETOMIDATE 2 MG/ML IV SOLN
INTRAVENOUS | Status: DC | PRN
  Administered 2015-10-12: 20 mg via INTRAVENOUS

## 2015-10-12 MED ORDER — BUPIVACAINE-EPINEPHRINE (PF) 0.25% -1:200000 IJ SOLN
INTRAMUSCULAR | Status: AC
Start: 2015-10-12 — End: 2015-10-12
  Filled 2015-10-12: qty 30

## 2015-10-12 SURGICAL SUPPLY — 27 items
BANDAGE ACE 6X5 VEL STRL LF (GAUZE/BANDAGES/DRESSINGS) ×2 IMPLANT
BLADE 4.2CUDA (BLADE) ×2 IMPLANT
COVER SURGICAL LIGHT HANDLE (MISCELLANEOUS) ×2 IMPLANT
CUFF TOURN SGL QUICK 34 (TOURNIQUET CUFF) ×1
CUFF TRNQT CYL 34X4X40X1 (TOURNIQUET CUFF) ×1 IMPLANT
DRAPE U-SHAPE 47X51 STRL (DRAPES) ×2 IMPLANT
DRSG EMULSION OIL 3X3 NADH (GAUZE/BANDAGES/DRESSINGS) ×2 IMPLANT
DRSG PAD ABDOMINAL 8X10 ST (GAUZE/BANDAGES/DRESSINGS) ×2 IMPLANT
DURAPREP 26ML APPLICATOR (WOUND CARE) ×2 IMPLANT
GAUZE SPONGE 4X4 12PLY STRL (GAUZE/BANDAGES/DRESSINGS) ×2 IMPLANT
GLOVE BIO SURGEON STRL SZ8 (GLOVE) ×6 IMPLANT
GLOVE BIOGEL PI IND STRL 8 (GLOVE) ×1 IMPLANT
GLOVE BIOGEL PI INDICATOR 8 (GLOVE) ×1
GOWN STRL REUS W/TWL LRG LVL3 (GOWN DISPOSABLE) ×2 IMPLANT
KIT BASIN OR (CUSTOM PROCEDURE TRAY) ×2 IMPLANT
MANIFOLD NEPTUNE II (INSTRUMENTS) ×2 IMPLANT
MARKER SKIN DUAL TIP RULER LAB (MISCELLANEOUS) ×2 IMPLANT
PACK ARTHROSCOPY WL (CUSTOM PROCEDURE TRAY) ×2 IMPLANT
PACK ICE MAXI GEL EZY WRAP (MISCELLANEOUS) ×6 IMPLANT
PAD ABD 8X10 STRL (GAUZE/BANDAGES/DRESSINGS) ×2 IMPLANT
PADDING CAST COTTON 6X4 STRL (CAST SUPPLIES) ×2 IMPLANT
POSITIONER SURGICAL ARM (MISCELLANEOUS) ×2 IMPLANT
SUT ETHILON 4 0 PS 2 18 (SUTURE) ×2 IMPLANT
TOWEL OR 17X26 10 PK STRL BLUE (TOWEL DISPOSABLE) ×2 IMPLANT
TUBING ARTHRO INFLOW-ONLY STRL (TUBING) ×2 IMPLANT
WAND HAND CNTRL MULTIVAC 90 (MISCELLANEOUS) ×2 IMPLANT
WRAP KNEE MAXI GEL POST OP (GAUZE/BANDAGES/DRESSINGS) ×2 IMPLANT

## 2015-10-12 NOTE — Transfer of Care (Signed)
Immediate Anesthesia Transfer of Care Note  Patient: Jeremy DurhamBrian T Diaz  Procedure(s) Performed: Procedure(s): LEFT ARTHROSCOPY KNEE WITH MENISCAL DEBRIDEMENT (Left)  Patient Location: PACU  Anesthesia Type:General  Level of Consciousness: Patient easily awoken, sedated, comfortable, cooperative, following commands, responds to stimulation.   Airway & Oxygen Therapy: Patient spontaneously breathing, ventilating well, oxygen via simple oxygen mask.  Post-op Assessment: Report given to PACU RN, vital signs reviewed and stable, moving all extremities.   Post vital signs: Reviewed and stable.  Complications: No apparent anesthesia complications

## 2015-10-12 NOTE — Interval H&P Note (Signed)
History and Physical Interval Note:  10/12/2015 8:29 AM  Jeremy DurhamBrian T Diaz  has presented today for surgery, with the diagnosis of LEFT KNEE MEDIAL MENISCUS TEAR  The various methods of treatment have been discussed with the patient and family. After consideration of risks, benefits and other options for treatment, the patient has consented to  Procedure(s): LEFT ARTHROSCOPY KNEE WITH MENISCAL DEBRIDEMENT (Left) as a surgical intervention .  The patient's history has been reviewed, patient examined, no change in status, stable for surgery.  I have reviewed the patient's chart and labs.  Questions were answered to the patient's satisfaction.     Loanne DrillingALUISIO,Efe Fazzino V

## 2015-10-12 NOTE — Discharge Instructions (Signed)
° °Dr. Frank Aluisio °Total Joint Specialist °[facility] °[address] °[place], [place] [address] °[phone] ° ° °Arthroscopic Procedure, Knee °An arthroscopic procedure can find what is wrong with your knee. °PROCEDURE °Arthroscopy is a surgical technique that allows your orthopedic surgeon to diagnose and treat your knee injury with accuracy. They will look into your knee through a small instrument. This is almost like a small (pencil sized) telescope. Because arthroscopy affects your knee less than open knee surgery, you can anticipate a more rapid recovery. Taking an active role by following your caregiver's instructions will help with rapid and complete recovery. Use crutches, rest, elevation, ice, and knee exercises as instructed. The length of recovery depends on various factors including type of injury, age, physical condition, medical conditions, and your rehabilitation. °Your knee is the joint between the large bones (femur and tibia) in your leg. Cartilage covers these bone ends which are smooth and slippery and allow your knee to bend and move smoothly. Two menisci, thick, semi-lunar shaped pads of cartilage which form a rim inside the joint, help absorb shock and stabilize your knee. Ligaments bind the bones together and support your knee joint. Muscles move the joint, help support your knee, and take stress off the joint itself. Because of this all programs and physical therapy to rehabilitate an injured or repaired knee require rebuilding and strengthening your muscles. °AFTER THE PROCEDURE °· After the procedure, you will be moved to a recovery area until most of the effects of the medication have worn off. Your caregiver will discuss the test results with you.  °· Only take over-the-counter or prescription medicines for pain, discomfort, or fever as directed by your caregiver.  °SEEK MEDICAL CARE IF:  °· You have increased bleeding from your wounds.  °· You see  redness, swelling, or have increasing pain in your wounds.  °· You have pus coming from your wound.  °· You have an oral temperature above 102° F (38.9° C).  °· You notice a bad smell coming from the wound or dressing.  °· You have severe pain with any motion of your knee.  °SEEK IMMEDIATE MEDICAL CARE IF:  °· You develop a rash.  °· You have difficulty breathing.  °· You have any allergic problems.  °FURTHER INSTRUCTIONS:  °· ICE to the affected knee every three hours for 30 minutes at a time and then as needed for pain and swelling.  Continue to use ice on the knee for pain and swelling from surgery. You may notice swelling that will progress down to the foot and ankle.  This is normal after surgery.  Elevate the leg when you are not up walking on it.   ° °DIET °You may resume your previous home diet once your are discharged from the hospital. ° °DRESSING / WOUND CARE / SHOWERING °You may start showering two days after being discharged home but do not submerge the incisions under water.  °Change dressing 48 hours after the procedure and then cover the small incisions with band aids until your follow up visit. °Change the surgical dressings daily and reapply a dry dressing each time.  ° °ACTIVITY °Walk with your walker as instructed. °Use walker as long as suggested by your caregivers. °Avoid periods of inactivity such as sitting longer than an hour when not asleep. This helps prevent blood clots.  °You may resume a sexual relationship in one month or when given the OK by your doctor.  °You may return to   work once you are cleared by your doctor.  °Do not drive a car for 6 weeks or until released by you surgeon.  °Do not drive while taking narcotics. ° °WEIGHT BEARING AS TOLERATED ° °POSTOPERATIVE CONSTIPATION PROTOCOL °Constipation - defined medically as fewer than three stools per week and severe constipation as less than one stool per week. ° °One of the most common issues patients have following surgery is  constipation.  Even if you have a regular bowel pattern at home, your normal regimen is likely to be disrupted due to multiple reasons following surgery.  Combination of anesthesia, postoperative narcotics, change in appetite and fluid intake all can affect your bowels.  In order to avoid complications following surgery, here are some recommendations in order to help you during your recovery period. ° °Colace (docusate) - Pick up an over-the-counter form of Colace or another stool softener and take twice a day as long as you are requiring postoperative pain medications.  Take with a full glass of water daily.  If you experience loose stools or diarrhea, hold the colace until you stool forms back up.  If your symptoms do not get better within 1 week or if they get worse, check with your doctor. ° °Dulcolax (bisacodyl) - Pick up over-the-counter and take as directed by the product packaging as needed to assist with the movement of your bowels.  Take with a full glass of water.  Use this product as needed if not relieved by Colace only.  ° °MiraLax (polyethylene glycol) - Pick up over-the-counter to have on hand.  MiraLax is a solution that will increase the amount of water in your bowels to assist with bowel movements.  Take as directed and can mix with a glass of water, juice, soda, coffee, or tea.  Take if you go more than two days without a movement. °Do not use MiraLax more than once per day. Call your doctor if you are still constipated or irregular after using this medication for 7 days in a row. ° °If you continue to have problems with postoperative constipation, please contact the office for further assistance and recommendations.  If you experience "the worst abdominal pain ever" or develop nausea or vomiting, please contact the office immediatly for further recommendations for treatment. ° °ITCHING ° If you experience itching with your medications, try taking only a single pain pill, or even half a pain pill  at a time.  You can also use Benadryl over the counter for itching or also to help with sleep.  ° °TED HOSE STOCKINGS °Wear the elastic stockings on both legs for three weeks following surgery during the day but you may remove then at night for sleeping. ° °MEDICATIONS °See your medication summary on the “After Visit Summary” that the nursing staff will review with you prior to discharge.  You may have some home medications which will be placed on hold until you complete the course of blood thinner medication.  It is important for you to complete the blood thinner medication as prescribed by your surgeon.  Continue your approved medications as instructed at time of discharge. °Do not drive while taking narcotics.  ° °PRECAUTIONS °If you experience chest pain or shortness of breath - call 911 immediately for transfer to the hospital emergency department.  °If you develop a fever greater that 101 F, purulent drainage from wound, increased redness or drainage from wound, foul odor from the wound/dressing, or calf pain - CONTACT YOUR SURGEON.   °                                                °  FOLLOW-UP APPOINTMENTS °Make sure you keep all of your appointments after your operation with your surgeon and caregivers. You should call the office at [phone]  and make an appointment for approximately one week after the date of your surgery or on the date instructed by your surgeon outlined in the "After Visit Summary". ° °RANGE OF MOTION AND STRENGTHENING EXERCISES  °Rehabilitation of the knee is important following a knee injury or an operation. After just a few days of immobilization, the muscles of the thigh which control the knee become weakened and shrink (atrophy). Knee exercises are designed to build up the tone and strength of the thigh muscles and to improve knee motion. Often times heat used for twenty to thirty minutes before working out will loosen up your tissues and help with improving the range of motion  but do not use heat for the first two weeks following surgery. These exercises can be done on a training (exercise) mat, on the floor, on a table or on a bed. Use what ever works the best and is most comfortable for you Knee exercises include: ° °QUAD STRENGTHENING EXERCISES °Strengthening Quadriceps Sets ° °Tighten muscles on top of thigh by pushing knees down into floor or table. °Hold for 20 seconds. Repeat 10 times. °Do 2 sessions per day. ° ° ° ° °Strengthening Terminal Knee Extension ° °With knee bent over bolster, straighten knee by tightening muscle on top of thigh. Be sure to keep bottom of knee on bolster. °Hold for 20 seconds. Repeat 10 times. °Do 2 sessions per day. ° ° °Straight Leg with Bent Knee ° °Lie on back with opposite leg bent. Keep involved knee slightly bent at knee and raise leg 4-6". Hold for 10 seconds. °Repeat 20 times per set. °Do 2 sets per session. °Do 2 sessions per day. ° ° ° ° °General Anesthesia, Adult, Care After °Refer to this sheet in the next few weeks. These instructions provide you with information on caring for yourself after your procedure. Your health care provider may also give you more specific instructions. Your treatment has been planned according to current medical practices, but problems sometimes occur. Call your health care provider if you have any problems or questions after your procedure. °WHAT TO EXPECT AFTER THE PROCEDURE °After the procedure, it is typical to experience: °· Sleepiness. °· Nausea and vomiting. °HOME CARE INSTRUCTIONS °· For the first 24 hours after general anesthesia: °¨ Have a responsible person with you. °¨ Do not drive a car. If you are alone, do not take public transportation. °¨ Do not drink alcohol. °¨ Do not take medicine that has not been prescribed by your health care provider. °¨ Do not sign important papers or make important decisions. °¨ You may resume a normal diet and activities as directed by your health care provider. °· Change  bandages (dressings) as directed. °· If you have questions or problems that seem related to general anesthesia, call the hospital and ask for the anesthetist or anesthesiologist on call. °SEEK MEDICAL CARE IF: °· You have nausea and vomiting that continue the day after anesthesia. °· You develop a rash. °SEEK IMMEDIATE MEDICAL CARE IF:  °· You have difficulty breathing. °· You have chest pain. °· You have any allergic problems. °  °This information is not intended to replace advice given to you by your health care provider. Make sure you discuss any questions you have with your health care provider. °  °Document Released: 08/13/2000 Document Revised: 05/28/2014 Document Reviewed:   09/05/2011 °Elsevier Interactive Patient Education ©2016 [employer] ° °

## 2015-10-12 NOTE — Op Note (Signed)
Preoperative diagnosis-  Left knee medial meniscal tear  Postoperative diagnosis Left- knee medial meniscal tear   Procedure- Left knee arthroscopy with medial meniscal debridement    Surgeon- Gus RankinFrank V. Sabriya Yono, MD  Anesthesia-General  EBL-  Minimal  Complications- None  Condition- PACU - hemodynamically stable.  Brief clinical note- -Jeremy DurhamBrian T Diaz is a 50 y.o.  male with a several month history of left knee pain and mechanical symptoms. Exam and history suggested medial meniscal tear confirmed by MRI. The patient presents now for arthroscopy and debridement   Procedure in detail -       After successful administration of General anesthetic, a tourmiquet is placed high on the Left  thigh and the Left lower extremity is prepped and draped in the usual sterile fashion. Time out is performed by the surgical team. Standard superomedial and inferolateral portal sites are marked and incisions made with an 11 blade. The inflow cannula is passed through the superomedial portal and camera through the inferolateral portal and inflow is initiated. Arthroscopic visualization proceeds.      The undersurface of the patella and trochlea are visualized and there is Grade II chondromalacia patella but no unstable chondral defects.. The medial and lateral gutters are visualized and there are  no loose bodies. Flexion and valgus force is applied to the knee and the medial compartment is entered. A spinal needle is passed into the joint through the site marked for the inferomedial portal. A small incision is made and the dilator passed into the joint. The findings for the medial compartment are medial meniscal tear body and posterior horn with displaced fragment into intercondylar notch . The tear is debrided to a stable base with baskets and a shaver and sealed off with the Arthrocare.  It is probed and found to be stable. There were no chondral defects.    The intercondylar notch is visualized and the ACL appears  normal. The lateral compartment is entered and the findings are normal .      The joint is again inspected and there are no other tears, defects or loose bodies identified. The arthroscopic equipment is then removed from the inferior portals which are closed with interrupted 4-0 nylon. 20 ml of .25% Marcaine with epinephrine are injected through the inflow cannula and the cannula is then removed and the portal closed with nylon. The incisions are cleaned and dried and a bulky sterile dressing is applied. The patient is then awakened and transported to recovery in stable condition.   10/12/2015, 9:25 AM

## 2015-10-12 NOTE — Anesthesia Procedure Notes (Signed)
Procedure Name: LMA Insertion Date/Time: 10/12/2015 8:44 AM Performed by: Ludwig LeanJONES, Jayla Mackie C Pre-anesthesia Checklist: Patient identified, Emergency Drugs available, Suction available and Patient being monitored Patient Re-evaluated:Patient Re-evaluated prior to inductionOxygen Delivery Method: Circle system utilized Preoxygenation: Pre-oxygenation with 100% oxygen Intubation Type: IV induction Ventilation: Mask ventilation without difficulty LMA: LMA inserted LMA Size: 4.0 Number of attempts: 1 Placement Confirmation: positive ETCO2 and breath sounds checked- equal and bilateral Tube secured with: Tape Dental Injury: Teeth and Oropharynx as per pre-operative assessment

## 2015-10-12 NOTE — H&P (Signed)
  CC- Jeremy Diaz is a 50 y.o. male who presents with left knee pain.  HPI- . Knee Pain: Patient presents with knee pain involving the  left knee. Onset of the symptoms was several months ago. Inciting event: none known. Current symptoms include giving out, pain located medially and stiffness. Pain is aggravated by going up and down stairs, kneeling, lateral movements, pivoting, rising after sitting and walking.  Patient has had no prior knee problems. Evaluation to date: MRI: abnormal medial meniscal tear. Treatment to date: rest.  Past Medical History  Diagnosis Date  . Seasonal allergies   . Anxiety   . GERD (gastroesophageal reflux disease) 03-27-11    tx. Nexium  . History of inguinal hernia repair     BIH    Past Surgical History  Procedure Laterality Date  . Nose surgery  200/2003/2008/2012  . Knee surgery  2009    right  . Shoulder surgery  2004    left  . Varicocele excision  2000  . Inguinal hernia repair  04/02/2011    Procedure: LAPAROSCOPIC BILATERAL INGUINAL HERNIA REPAIR;  Surgeon: Adolph Pollackodd J Rosenbower, MD;  Location: [facility];  Service: General;  Laterality: Bilateral;  laparoscopic repair bilateral inguinal hernia    Prior to Admission medications   Medication Sig Start Date End Date Taking? Authorizing Provider  AMBULATORY NON FORMULARY MEDICATION Inject into the muscle 2 (two) times a week. Medication Name:allergy shots   Yes Historical Provider, MD  buPROPion (WELLBUTRIN XL) 150 MG 24 hr tablet Take 150 mg by mouth daily.   Yes Historical Provider, MD  EPINEPHrine (EPIPEN 2-PAK) 0.3 mg/0.3 mL IJ SOAJ injection Inject 0.3 mg into the muscle once as needed (For anaphylaxis.).  04/30/13  Yes Historical Provider, MD  esomeprazole (NEXIUM) 40 MG capsule Take 40 mg by mouth daily before breakfast.    Yes Historical Provider, MD  ibuprofen (ADVIL,MOTRIN) 200 MG tablet Take 600 mg by mouth every 6 (six) hours as needed (For knee pain.).   Yes Historical Provider, MD   lisinopril-hydrochlorothiazide (PRINZIDE,ZESTORETIC) 10-12.5 MG tablet Take 1 tablet by mouth daily.   Yes Historical Provider, MD  pravastatin (PRAVACHOL) 40 MG tablet Take 40 mg by mouth daily.   Yes Historical Provider, MD   KNEE EXAM antalgic gait, soft tissue tenderness over medial joint line, no effusion, negative drawer sign, collateral ligaments intact  Physical Examination: General appearance - alert, well appearing, and in no distress Mental status - alert, oriented to person, place, and time Chest - clear to auscultation, no wheezes, rales or rhonchi, symmetric air entry Heart - normal rate, regular rhythm, normal S1, S2, no murmurs, rubs, clicks or gallops Abdomen - soft, nontender, nondistended, no masses or organomegaly Neurological - alert, oriented, normal speech, no focal findings or movement disorder noted   Asessment/Plan--- Left knee medial meniscal tear- - Plan left knee arthroscopy with meniscal debridement. Procedure risks and potential comps discussed with patient who elects to proceed. Goals are decreased pain and increased function with a high likelihood of achieving both

## 2015-10-12 NOTE — Anesthesia Postprocedure Evaluation (Signed)
Anesthesia Post Note  Patient: Dale DurhamBrian T Korb  Procedure(s) Performed: Procedure(s) (LRB): LEFT ARTHROSCOPY KNEE WITH MENISCAL DEBRIDEMENT (Left)  Patient location during evaluation: PACU Anesthesia Type: General Level of consciousness: awake and alert Pain management: pain level controlled Vital Signs Assessment: post-procedure vital signs reviewed and stable Respiratory status: spontaneous breathing, nonlabored ventilation, respiratory function stable and patient connected to nasal cannula oxygen Cardiovascular status: blood pressure returned to baseline and stable Postop Assessment: no signs of nausea or vomiting Anesthetic complications: no    Last Vitals:  Filed Vitals:   10/12/15 1012 10/12/15 1015  BP:  102/75  Pulse: 72 74  Temp:    Resp:  12    Last Pain:  Filed Vitals:   10/12/15 1019  PainSc: 6     LLE Motor Response: Responds to commands (10/12/15 1015) LLE Sensation: Full sensation (10/12/15 1015)          Dequarius Jeffries J

## 2015-12-07 DIAGNOSIS — J301 Allergic rhinitis due to pollen: Secondary | ICD-10-CM | POA: Diagnosis not present

## 2015-12-07 DIAGNOSIS — J3089 Other allergic rhinitis: Secondary | ICD-10-CM | POA: Diagnosis not present

## 2016-03-23 DIAGNOSIS — J3089 Other allergic rhinitis: Secondary | ICD-10-CM | POA: Diagnosis not present

## 2016-03-23 DIAGNOSIS — J301 Allergic rhinitis due to pollen: Secondary | ICD-10-CM | POA: Diagnosis not present

## 2016-04-03 DIAGNOSIS — J3089 Other allergic rhinitis: Secondary | ICD-10-CM | POA: Diagnosis not present

## 2016-04-03 DIAGNOSIS — J301 Allergic rhinitis due to pollen: Secondary | ICD-10-CM | POA: Diagnosis not present

## 2016-04-17 DIAGNOSIS — J301 Allergic rhinitis due to pollen: Secondary | ICD-10-CM | POA: Diagnosis not present

## 2016-04-17 DIAGNOSIS — J3089 Other allergic rhinitis: Secondary | ICD-10-CM | POA: Diagnosis not present

## 2016-05-08 DIAGNOSIS — H1045 Other chronic allergic conjunctivitis: Secondary | ICD-10-CM | POA: Diagnosis not present

## 2016-05-08 DIAGNOSIS — J3089 Other allergic rhinitis: Secondary | ICD-10-CM | POA: Diagnosis not present

## 2016-05-08 DIAGNOSIS — J301 Allergic rhinitis due to pollen: Secondary | ICD-10-CM | POA: Diagnosis not present

## 2016-05-08 DIAGNOSIS — J3081 Allergic rhinitis due to animal (cat) (dog) hair and dander: Secondary | ICD-10-CM | POA: Diagnosis not present

## 2016-05-10 DIAGNOSIS — J301 Allergic rhinitis due to pollen: Secondary | ICD-10-CM | POA: Diagnosis not present

## 2016-05-10 DIAGNOSIS — J3081 Allergic rhinitis due to animal (cat) (dog) hair and dander: Secondary | ICD-10-CM | POA: Diagnosis not present

## 2016-05-11 DIAGNOSIS — J3089 Other allergic rhinitis: Secondary | ICD-10-CM | POA: Diagnosis not present

## 2016-05-23 DIAGNOSIS — J301 Allergic rhinitis due to pollen: Secondary | ICD-10-CM | POA: Diagnosis not present

## 2016-05-23 DIAGNOSIS — J3089 Other allergic rhinitis: Secondary | ICD-10-CM | POA: Diagnosis not present

## 2016-05-30 DIAGNOSIS — J301 Allergic rhinitis due to pollen: Secondary | ICD-10-CM | POA: Diagnosis not present

## 2016-05-30 DIAGNOSIS — J3089 Other allergic rhinitis: Secondary | ICD-10-CM | POA: Diagnosis not present

## 2016-06-11 DIAGNOSIS — J301 Allergic rhinitis due to pollen: Secondary | ICD-10-CM | POA: Diagnosis not present

## 2016-06-11 DIAGNOSIS — J3089 Other allergic rhinitis: Secondary | ICD-10-CM | POA: Diagnosis not present

## 2016-06-17 ENCOUNTER — Emergency Department (HOSPITAL_COMMUNITY): Payer: BLUE CROSS/BLUE SHIELD

## 2016-06-17 ENCOUNTER — Emergency Department (HOSPITAL_COMMUNITY)
Admission: EM | Admit: 2016-06-17 | Discharge: 2016-06-17 | Disposition: A | Discharge status: Home / Self Care | Payer: BLUE CROSS/BLUE SHIELD | Attending: Emergency Medicine | Admitting: Emergency Medicine

## 2016-06-17 ENCOUNTER — Encounter (HOSPITAL_COMMUNITY): Payer: Self-pay | Admitting: Emergency Medicine

## 2016-06-17 DIAGNOSIS — Z8719 Personal history of other diseases of the digestive system: Secondary | ICD-10-CM | POA: Diagnosis not present

## 2016-06-17 DIAGNOSIS — R1011 Right upper quadrant pain: Secondary | ICD-10-CM | POA: Insufficient documentation

## 2016-06-17 DIAGNOSIS — R1013 Epigastric pain: Secondary | ICD-10-CM | POA: Diagnosis not present

## 2016-06-17 DIAGNOSIS — R52 Pain, unspecified: Secondary | ICD-10-CM

## 2016-06-17 DIAGNOSIS — Z79899 Other long term (current) drug therapy: Secondary | ICD-10-CM | POA: Diagnosis not present

## 2016-06-17 DIAGNOSIS — R109 Unspecified abdominal pain: Secondary | ICD-10-CM

## 2016-06-17 DIAGNOSIS — R05 Cough: Secondary | ICD-10-CM | POA: Diagnosis not present

## 2016-06-17 LAB — URINALYSIS, ROUTINE W REFLEX MICROSCOPIC
Bilirubin Urine: NEGATIVE
Glucose, UA: NEGATIVE mg/dL
Hgb urine dipstick: NEGATIVE
Ketones, ur: NEGATIVE mg/dL
LEUKOCYTES UA: NEGATIVE
Nitrite: NEGATIVE
Protein, ur: NEGATIVE mg/dL
SPECIFIC GRAVITY, URINE: 1.026 (ref 1.005–1.030)
pH: 5 (ref 5.0–8.0)

## 2016-06-17 LAB — COMPREHENSIVE METABOLIC PANEL
ALT: 30 U/L (ref 17–63)
ANION GAP: 10 (ref 5–15)
AST: 31 U/L (ref 15–41)
Albumin: 4.1 g/dL (ref 3.5–5.0)
Alkaline Phosphatase: 71 U/L (ref 38–126)
BUN: 16 mg/dL (ref 6–20)
CO2: 21 mmol/L — AB (ref 22–32)
CREATININE: 1.26 mg/dL — AB (ref 0.61–1.24)
Calcium: 8.7 mg/dL — ABNORMAL LOW (ref 8.9–10.3)
Chloride: 104 mmol/L (ref 101–111)
Glucose, Bld: 121 mg/dL — ABNORMAL HIGH (ref 65–99)
Potassium: 3.8 mmol/L (ref 3.5–5.1)
SODIUM: 135 mmol/L (ref 135–145)
Total Bilirubin: 1.2 mg/dL (ref 0.3–1.2)
Total Protein: 7 g/dL (ref 6.5–8.1)

## 2016-06-17 LAB — CBC
HCT: 42.4 % (ref 39.0–52.0)
HEMOGLOBIN: 14.8 g/dL (ref 13.0–17.0)
MCH: 31.7 pg (ref 26.0–34.0)
MCHC: 34.9 g/dL (ref 30.0–36.0)
MCV: 90.8 fL (ref 78.0–100.0)
PLATELETS: 206 10*3/uL (ref 150–400)
RBC: 4.67 MIL/uL (ref 4.22–5.81)
RDW: 12.3 % (ref 11.5–15.5)
WBC: 8.4 10*3/uL (ref 4.0–10.5)

## 2016-06-17 LAB — LIPASE, BLOOD: LIPASE: 26 U/L (ref 11–51)

## 2016-06-17 MED ORDER — GI COCKTAIL ~~LOC~~
30.0000 mL | Freq: Once | ORAL | Status: AC
  Administered 2016-06-17: 30 mL via ORAL
  Filled 2016-06-17: qty 30

## 2016-06-17 MED ORDER — PANTOPRAZOLE SODIUM 40 MG PO TBEC: 40.0000 mg | DELAYED_RELEASE_TABLET | Freq: Two times a day (BID) | ORAL | 0 refills | Status: DC

## 2016-06-17 MED ORDER — ACETAMINOPHEN 500 MG PO TABS
1000.0000 mg | ORAL_TABLET | Freq: Once | ORAL | Status: AC
  Administered 2016-06-17: 1000 mg via ORAL
  Filled 2016-06-17: qty 2

## 2016-06-17 MED ORDER — SUCRALFATE 1 G PO TABS: 1.0000 g | ORAL_TABLET | Freq: Three times a day (TID) | ORAL | 0 refills | Status: DC

## 2016-06-17 NOTE — ED Notes (Signed)
Patient transported to Ultrasound 

## 2016-06-17 NOTE — ED Notes (Signed)
Called [facility]. Pt next for pick up

## 2016-06-17 NOTE — ED Provider Notes (Signed)
[facility]-EMERGENCY DEPT Provider Note  CSN: [id number] Arrival date & time: 06/17/16  1648  History   Chief Complaint Chief Complaint  Patient presents with  . Abdominal Pain   HPI Dale DurhamBrian T Ihnen is a 51 y.o. male.  The history is provided by the patient, medical records and a relative. No language interpreter was used.  Illness  This is a new problem. The current episode started more than 1 week ago. The problem occurs daily (Intermittent). The problem has not changed since onset.Associated symptoms include abdominal pain (RUQ). Pertinent negatives include no chest pain, no headaches and no shortness of breath. The symptoms are aggravated by eating. Nothing relieves the symptoms.    Past Medical History:  Diagnosis Date  . Anxiety   . GERD (gastroesophageal reflux disease) 03-27-11   tx. Nexium  . History of inguinal hernia repair    BIH  . Seasonal allergies    Patient Active Problem List   Diagnosis Date Noted  . Acute medial meniscal tear 10/12/2015   Past Surgical History:  Procedure Laterality Date  . INGUINAL HERNIA REPAIR  04/02/2011   Procedure: LAPAROSCOPIC BILATERAL INGUINAL HERNIA REPAIR;  Surgeon: Adolph Pollackodd J Rosenbower, MD;  Location: [facility];  Service: General;  Laterality: Bilateral;  laparoscopic repair bilateral inguinal hernia  . KNEE ARTHROSCOPY Left 10/12/2015   Procedure: LEFT ARTHROSCOPY KNEE WITH MENISCAL DEBRIDEMENT;  Surgeon: Ollen GrossFrank Aluisio, MD;  Location: [facility];  Service: Orthopedics;  Laterality: Left;  . KNEE SURGERY  2009   right  . NOSE SURGERY  200/2003/2008/2012  . SHOULDER SURGERY  2004   left  . VARICOCELE EXCISION  2000    Home Medications    Prior to Admission medications   Medication Sig Start Date End Date Taking? Authorizing Provider  AMBULATORY NON FORMULARY MEDICATION Inject into the muscle 2 (two) times a week. Medication Name:allergy shots    Historical Provider, MD  buPROPion (WELLBUTRIN XL) 150 MG 24 hr tablet Take 150 mg by mouth  daily.    Historical Provider, MD  EPINEPHrine (EPIPEN 2-PAK) 0.3 mg/0.3 mL IJ SOAJ injection Inject 0.3 mg into the muscle once as needed (For anaphylaxis.).  04/30/13   Historical Provider, MD  esomeprazole (NEXIUM) 40 MG capsule Take 40 mg by mouth daily before breakfast.     Historical Provider, MD  HYDROcodone-acetaminophen (NORCO) 5-325 MG tablet Take 1-2 tablets by mouth every 4 (four) hours as needed for moderate pain. 10/12/15   Ollen GrossFrank Aluisio, MD  ibuprofen (ADVIL,MOTRIN) 200 MG tablet Take 600 mg by mouth every 6 (six) hours as needed (For knee pain.).    Historical Provider, MD  lisinopril-hydrochlorothiazide (PRINZIDE,ZESTORETIC) 10-12.5 MG tablet Take 1 tablet by mouth daily.    Historical Provider, MD  methocarbamol (ROBAXIN) 500 MG tablet Take 1 tablet (500 mg total) by mouth 4 (four) times daily. As needed for muscle spasm 10/12/15   Ollen GrossFrank Aluisio, MD  pantoprazole (PROTONIX) 40 MG tablet Take 1 tablet (40 mg total) by mouth 2 (two) times daily. 06/17/16 07/17/16  Angelina Okyan Terri Malerba, MD  pravastatin (PRAVACHOL) 40 MG tablet Take 40 mg by mouth daily.    Historical Provider, MD  sucralfate (CARAFATE) 1 g tablet Take 1 tablet (1 g total) by mouth 4 (four) times daily -  with meals and at bedtime. 06/17/16 07/17/16  Angelina Okyan Moksha Dorgan, MD   Family History History reviewed. No pertinent family history.  Social History Social History  Substance Use Topics  . Smoking status: Never Smoker  . Smokeless tobacco: Never Used  .  Alcohol use No    Allergies   Eggs or egg-derived products; Amoxil [amoxicillin trihydrate]; Banana; and Skelaxin  Review of Systems Review of Systems  Constitutional: Positive for appetite change (decreased) and chills (today). Negative for fever.  Respiratory: Negative for shortness of breath.   Cardiovascular: Negative for chest pain.  Gastrointestinal: Positive for abdominal pain (RUQ) and nausea. Negative for diarrhea and vomiting.  Neurological: Negative for  headaches.  All other systems reviewed and are negative.   Physical Exam Updated Vital Signs BP 95/66   Pulse 89   Temp 99.2 F (37.3 C) (Oral) Comment: MD aware  Resp 16   Ht 5\' 7"  (1.702 m)   Wt 81.6 kg   SpO2 92%   BMI 28.19 kg/m   Physical Exam  Constitutional: He is oriented to person, place, and time. No distress.  Overweight middle-aged Caucasian male  HENT:  Head: Normocephalic and atraumatic.  Eyes: EOM are normal. Pupils are equal, round, and reactive to light.  Neck: Normal range of motion. Neck supple.  Cardiovascular: Regular rhythm and normal heart sounds.  Tachycardia present.   Pulmonary/Chest: Effort normal and breath sounds normal. He exhibits tenderness (R lower lateral).  Abdominal: Soft. Bowel sounds are normal. He exhibits no distension. There is tenderness (RUQ).  Musculoskeletal: Normal range of motion.  Neurological: He is alert and oriented to person, place, and time.  Skin: Skin is warm and dry. Capillary refill takes less than 2 seconds. He is not diaphoretic.  Nursing note and vitals reviewed.   ED Treatments / Results  Labs (all labs ordered are listed, but only abnormal results are displayed) Labs Reviewed  COMPREHENSIVE METABOLIC PANEL - Abnormal; Notable for the following:       Result Value   CO2 21 (*)    Glucose, Bld 121 (*)    Creatinine, Ser 1.26 (*)    Calcium 8.7 (*)    All other components within normal limits  LIPASE, BLOOD  CBC  URINALYSIS, ROUTINE W REFLEX MICROSCOPIC   EKG  EKG Interpretation None      Radiology Dg Chest 2 View  Result Date: 06/17/2016 CLINICAL DATA:  Pain under his right breast for several months, worsened over the past 2 weeks. Nonproductive cough this afternoon. EXAM: CHEST  2 VIEW COMPARISON:  06/02/2013 FINDINGS: The heart size and mediastinal contours are within normal limits. Both lungs are clear. The visualized skeletal structures are unremarkable. IMPRESSION: No active cardiopulmonary  disease. Electronically Signed   By: Ellery Plunk M.D.   On: 06/17/2016 19:09   US Abdomen Limited Ruq  Result Date: 06/17/2016 CLINICAL DATA:  51 year old male with right upper quadrant abdominal pain for 1 week. EXAM: US ABDOMEN LIMITED - RIGHT UPPER QUADRANT COMPARISON:  None. FINDINGS: Gallbladder: The gallbladder is unremarkable. There is no evidence of cholelithiasis or acute cholecystitis. Common bile duct: Diameter: 2 mm. There is no evidence of intrahepatic or extrahepatic biliary dilatation. Liver: No focal lesion identified. Within normal limits in parenchymal echogenicity. IMPRESSION: Unremarkable right upper quadrant abdominal ultrasound. Electronically Signed   By: Harmon Pier M.D.   On: 06/17/2016 20:39   Procedures Procedures (including critical care time)  Medications Ordered in ED Medications  gi cocktail (Maalox,Lidocaine,Donnatal) (not administered)    Initial Impression / Assessment and Plan / ED Course  I have reviewed the triage vital signs and the nursing notes.  51 y.o. male with above stated PMHx, HPI, and physical. Symptoms onset of her past 2 months. Intermittent sharp right upper  quadrant abdominal pain worse postprandially associated with nausea and decreased appetite. Onset of chills today w/ x4 episodes of loose non-bloody stools. Patient denies fever, cough, shortness of breath, urinary symptoms.  UA showing no evidence of infection or blood or severe dehydration. Labs w/ mild AKI & acidosis but otherwise unremarkable. Given IVF's. Chest x-ray showing no acute cardio Poe disease. Right upper quadrant ultrasound showing no evidence of cholelithiasis or acute cholecystitis. Patient's symptoms likely from peptic ulcer. Patient given GI cocktail as well as increased Protonix regimen with addition of Carafate at home. Patient will follow up with GI for upper endoscopy.  Laboratory and imaging results were personally reviewed by myself and used in the medical  decision making of this patient's treatment and disposition.  Pt discharged home in stable condition. Strict ED return precautions dicussed. Pt understands and agrees with the plan and has no further questions or concerns.   Pt care discussed with and followed by my attending, Dr. Rolland Porter  Angelina Ok, MD Pager [phone]  Final Clinical Impressions(s) / ED Diagnoses   Final diagnoses:  Abdominal pain  Epigastric pain  RUQ pain  History of hiatal hernia   New Prescriptions New Prescriptions   PANTOPRAZOLE (PROTONIX) 40 MG TABLET    Take 1 tablet (40 mg total) by mouth 2 (two) times daily.   SUCRALFATE (CARAFATE) 1 G TABLET    Take 1 tablet (1 g total) by mouth 4 (four) times daily -  with meals and at bedtime.     Angelina Ok, MD 06/17/16 [id number]    Rolland Porter, MD 06/17/16 2139

## 2016-06-17 NOTE — ED Notes (Signed)
Pt wife stated he felt like he had a fever, this EMT checked it, it was 99.2.

## 2016-06-17 NOTE — ED Triage Notes (Signed)
Pt sts RUQ pain worse after eating x 1 week

## 2016-06-20 DIAGNOSIS — K219 Gastro-esophageal reflux disease without esophagitis: Secondary | ICD-10-CM | POA: Diagnosis not present

## 2016-06-20 DIAGNOSIS — R079 Chest pain, unspecified: Secondary | ICD-10-CM | POA: Diagnosis not present

## 2016-06-21 DIAGNOSIS — K2950 Unspecified chronic gastritis without bleeding: Secondary | ICD-10-CM | POA: Diagnosis not present

## 2016-06-21 DIAGNOSIS — R079 Chest pain, unspecified: Secondary | ICD-10-CM | POA: Diagnosis not present

## 2016-06-21 DIAGNOSIS — K2970 Gastritis, unspecified, without bleeding: Secondary | ICD-10-CM | POA: Diagnosis not present

## 2016-06-21 DIAGNOSIS — K2990 Gastroduodenitis, unspecified, without bleeding: Secondary | ICD-10-CM | POA: Diagnosis not present

## 2016-06-21 DIAGNOSIS — K219 Gastro-esophageal reflux disease without esophagitis: Secondary | ICD-10-CM | POA: Diagnosis not present

## 2016-06-25 DIAGNOSIS — J3081 Allergic rhinitis due to animal (cat) (dog) hair and dander: Secondary | ICD-10-CM | POA: Diagnosis not present

## 2016-06-25 DIAGNOSIS — J301 Allergic rhinitis due to pollen: Secondary | ICD-10-CM | POA: Diagnosis not present

## 2016-06-25 DIAGNOSIS — J3089 Other allergic rhinitis: Secondary | ICD-10-CM | POA: Diagnosis not present

## 2016-07-02 DIAGNOSIS — J3089 Other allergic rhinitis: Secondary | ICD-10-CM | POA: Diagnosis not present

## 2016-07-02 DIAGNOSIS — J301 Allergic rhinitis due to pollen: Secondary | ICD-10-CM | POA: Diagnosis not present

## 2016-07-02 DIAGNOSIS — J3081 Allergic rhinitis due to animal (cat) (dog) hair and dander: Secondary | ICD-10-CM | POA: Diagnosis not present

## 2016-07-03 ENCOUNTER — Other Ambulatory Visit: Payer: Self-pay | Admitting: Gastroenterology

## 2016-07-03 DIAGNOSIS — R079 Chest pain, unspecified: Secondary | ICD-10-CM

## 2016-07-03 DIAGNOSIS — K219 Gastro-esophageal reflux disease without esophagitis: Secondary | ICD-10-CM

## 2016-07-09 ENCOUNTER — Encounter (HOSPITAL_COMMUNITY)
Admission: RE | Admit: 2016-07-09 | Discharge: 2016-07-09 | Disposition: A | Discharge status: Home / Self Care | Payer: BLUE CROSS/BLUE SHIELD | Source: Ambulatory Visit | Attending: Gastroenterology | Admitting: Gastroenterology

## 2016-07-09 DIAGNOSIS — R079 Chest pain, unspecified: Secondary | ICD-10-CM | POA: Diagnosis not present

## 2016-07-09 DIAGNOSIS — J3089 Other allergic rhinitis: Secondary | ICD-10-CM | POA: Diagnosis not present

## 2016-07-09 DIAGNOSIS — J3081 Allergic rhinitis due to animal (cat) (dog) hair and dander: Secondary | ICD-10-CM | POA: Diagnosis not present

## 2016-07-09 DIAGNOSIS — J301 Allergic rhinitis due to pollen: Secondary | ICD-10-CM | POA: Diagnosis not present

## 2016-07-09 DIAGNOSIS — K219 Gastro-esophageal reflux disease without esophagitis: Secondary | ICD-10-CM | POA: Diagnosis present

## 2016-07-09 DIAGNOSIS — R1011 Right upper quadrant pain: Secondary | ICD-10-CM | POA: Diagnosis not present

## 2016-07-09 MED ORDER — TECHNETIUM TC 99M MEBROFENIN IV KIT
5.0000 | PACK | Freq: Once | INTRAVENOUS | Status: AC | PRN
  Administered 2016-07-09: 5 via INTRAVENOUS

## 2016-07-10 DIAGNOSIS — N509 Disorder of male genital organs, unspecified: Secondary | ICD-10-CM | POA: Diagnosis not present

## 2016-07-10 DIAGNOSIS — N503 Cyst of epididymis: Secondary | ICD-10-CM | POA: Diagnosis not present

## 2016-07-10 DIAGNOSIS — N50811 Right testicular pain: Secondary | ICD-10-CM | POA: Diagnosis not present

## 2016-07-17 DIAGNOSIS — J3089 Other allergic rhinitis: Secondary | ICD-10-CM | POA: Diagnosis not present

## 2016-07-17 DIAGNOSIS — J3081 Allergic rhinitis due to animal (cat) (dog) hair and dander: Secondary | ICD-10-CM | POA: Diagnosis not present

## 2016-07-17 DIAGNOSIS — J301 Allergic rhinitis due to pollen: Secondary | ICD-10-CM | POA: Diagnosis not present

## 2016-07-19 DIAGNOSIS — R1011 Right upper quadrant pain: Secondary | ICD-10-CM | POA: Diagnosis not present

## 2016-07-24 ENCOUNTER — Other Ambulatory Visit (HOSPITAL_COMMUNITY): Payer: Self-pay | Admitting: General Surgery

## 2016-07-24 DIAGNOSIS — J301 Allergic rhinitis due to pollen: Secondary | ICD-10-CM | POA: Diagnosis not present

## 2016-07-24 DIAGNOSIS — J3081 Allergic rhinitis due to animal (cat) (dog) hair and dander: Secondary | ICD-10-CM | POA: Diagnosis not present

## 2016-07-24 DIAGNOSIS — J3089 Other allergic rhinitis: Secondary | ICD-10-CM | POA: Diagnosis not present

## 2016-07-24 DIAGNOSIS — R1011 Right upper quadrant pain: Secondary | ICD-10-CM

## 2016-07-27 DIAGNOSIS — F39 Unspecified mood [affective] disorder: Secondary | ICD-10-CM | POA: Diagnosis not present

## 2016-07-27 DIAGNOSIS — K219 Gastro-esophageal reflux disease without esophagitis: Secondary | ICD-10-CM | POA: Diagnosis not present

## 2016-07-27 DIAGNOSIS — E7800 Pure hypercholesterolemia, unspecified: Secondary | ICD-10-CM | POA: Diagnosis not present

## 2016-07-27 DIAGNOSIS — I10 Essential (primary) hypertension: Secondary | ICD-10-CM | POA: Diagnosis not present

## 2016-09-21 ENCOUNTER — Encounter (HOSPITAL_COMMUNITY): Payer: Self-pay

## 2016-09-21 ENCOUNTER — Ambulatory Visit (HOSPITAL_COMMUNITY)
Admission: RE | Admit: 2016-09-21 | Discharge: 2016-09-21 | Disposition: A | Discharge status: Home / Self Care | Payer: BLUE CROSS/BLUE SHIELD | Source: Ambulatory Visit | Attending: General Surgery | Admitting: General Surgery

## 2016-09-21 DIAGNOSIS — R1011 Right upper quadrant pain: Secondary | ICD-10-CM

## 2016-10-05 ENCOUNTER — Ambulatory Visit (HOSPITAL_COMMUNITY)
Admission: RE | Admit: 2016-10-05 | Discharge: 2016-10-05 | Disposition: A | Discharge status: Home / Self Care | Payer: BLUE CROSS/BLUE SHIELD | Source: Ambulatory Visit | Attending: General Surgery | Admitting: General Surgery

## 2016-10-05 DIAGNOSIS — R1011 Right upper quadrant pain: Secondary | ICD-10-CM | POA: Insufficient documentation

## 2016-10-05 DIAGNOSIS — R140 Abdominal distension (gaseous): Secondary | ICD-10-CM | POA: Diagnosis not present

## 2016-10-05 DIAGNOSIS — R1010 Upper abdominal pain, unspecified: Secondary | ICD-10-CM | POA: Diagnosis not present

## 2016-10-05 MED ORDER — TECHNETIUM TC 99M SULFUR COLLOID
2.0000 | Freq: Once | INTRAVENOUS | Status: AC | PRN
Start: 2016-10-05 — End: 2016-10-05
  Administered 2016-10-05: 2 via ORAL

## 2016-11-05 DIAGNOSIS — M545 Low back pain: Secondary | ICD-10-CM | POA: Diagnosis not present

## 2017-01-15 DIAGNOSIS — M542 Cervicalgia: Secondary | ICD-10-CM | POA: Diagnosis not present

## 2017-01-15 DIAGNOSIS — M5412 Radiculopathy, cervical region: Secondary | ICD-10-CM | POA: Diagnosis not present

## 2017-01-15 DIAGNOSIS — Z6827 Body mass index (BMI) 27.0-27.9, adult: Secondary | ICD-10-CM | POA: Diagnosis not present

## 2017-01-24 DIAGNOSIS — M5412 Radiculopathy, cervical region: Secondary | ICD-10-CM | POA: Diagnosis not present

## 2017-01-24 DIAGNOSIS — M542 Cervicalgia: Secondary | ICD-10-CM | POA: Diagnosis not present

## 2017-02-07 DIAGNOSIS — M542 Cervicalgia: Secondary | ICD-10-CM | POA: Diagnosis not present

## 2017-02-07 DIAGNOSIS — Z6827 Body mass index (BMI) 27.0-27.9, adult: Secondary | ICD-10-CM | POA: Diagnosis not present

## 2017-02-07 DIAGNOSIS — M4722 Other spondylosis with radiculopathy, cervical region: Secondary | ICD-10-CM | POA: Diagnosis not present

## 2017-02-26 DIAGNOSIS — G5621 Lesion of ulnar nerve, right upper limb: Secondary | ICD-10-CM | POA: Diagnosis not present

## 2017-02-26 DIAGNOSIS — M7711 Lateral epicondylitis, right elbow: Secondary | ICD-10-CM | POA: Diagnosis not present

## 2017-03-13 DIAGNOSIS — Z125 Encounter for screening for malignant neoplasm of prostate: Secondary | ICD-10-CM | POA: Diagnosis not present

## 2017-03-13 DIAGNOSIS — Z0000 Encounter for general adult medical examination without abnormal findings: Secondary | ICD-10-CM | POA: Diagnosis not present

## 2017-03-13 DIAGNOSIS — E7800 Pure hypercholesterolemia, unspecified: Secondary | ICD-10-CM | POA: Diagnosis not present

## 2017-03-18 DIAGNOSIS — K219 Gastro-esophageal reflux disease without esophagitis: Secondary | ICD-10-CM | POA: Diagnosis not present

## 2017-03-18 DIAGNOSIS — R1013 Epigastric pain: Secondary | ICD-10-CM | POA: Diagnosis not present

## 2017-03-18 DIAGNOSIS — Z1211 Encounter for screening for malignant neoplasm of colon: Secondary | ICD-10-CM | POA: Diagnosis not present

## 2017-04-26 ENCOUNTER — Ambulatory Visit
Admission: RE | Admit: 2017-04-26 | Discharge: 2017-04-26 | Disposition: A | Discharge status: Home / Self Care | Payer: BLUE CROSS/BLUE SHIELD | Source: Ambulatory Visit | Attending: Family Medicine | Admitting: Family Medicine

## 2017-04-26 ENCOUNTER — Other Ambulatory Visit: Payer: Self-pay | Admitting: Family Medicine

## 2017-04-26 DIAGNOSIS — M79671 Pain in right foot: Secondary | ICD-10-CM | POA: Diagnosis not present

## 2017-04-26 DIAGNOSIS — M19071 Primary osteoarthritis, right ankle and foot: Secondary | ICD-10-CM | POA: Diagnosis not present

## 2017-04-26 DIAGNOSIS — M25571 Pain in right ankle and joints of right foot: Secondary | ICD-10-CM

## 2017-05-07 DIAGNOSIS — Z1211 Encounter for screening for malignant neoplasm of colon: Secondary | ICD-10-CM | POA: Diagnosis not present

## 2017-05-08 DIAGNOSIS — M7711 Lateral epicondylitis, right elbow: Secondary | ICD-10-CM | POA: Diagnosis not present

## 2017-05-09 DIAGNOSIS — H1045 Other chronic allergic conjunctivitis: Secondary | ICD-10-CM | POA: Diagnosis not present

## 2017-05-09 DIAGNOSIS — J3081 Allergic rhinitis due to animal (cat) (dog) hair and dander: Secondary | ICD-10-CM | POA: Diagnosis not present

## 2017-05-09 DIAGNOSIS — J301 Allergic rhinitis due to pollen: Secondary | ICD-10-CM | POA: Diagnosis not present

## 2017-05-09 DIAGNOSIS — J3089 Other allergic rhinitis: Secondary | ICD-10-CM | POA: Diagnosis not present

## 2017-06-09 ENCOUNTER — Emergency Department (HOSPITAL_COMMUNITY): Payer: BLUE CROSS/BLUE SHIELD

## 2017-06-09 ENCOUNTER — Encounter (HOSPITAL_COMMUNITY): Payer: Self-pay | Admitting: *Deleted

## 2017-06-09 ENCOUNTER — Emergency Department (HOSPITAL_COMMUNITY)
Admission: EM | Admit: 2017-06-09 | Discharge: 2017-06-09 | Disposition: A | Discharge status: Home / Self Care | Payer: BLUE CROSS/BLUE SHIELD | Attending: Emergency Medicine | Admitting: Emergency Medicine

## 2017-06-09 ENCOUNTER — Other Ambulatory Visit: Payer: Self-pay

## 2017-06-09 DIAGNOSIS — R1030 Lower abdominal pain, unspecified: Secondary | ICD-10-CM | POA: Diagnosis not present

## 2017-06-09 DIAGNOSIS — K529 Noninfective gastroenteritis and colitis, unspecified: Secondary | ICD-10-CM | POA: Diagnosis not present

## 2017-06-09 DIAGNOSIS — R109 Unspecified abdominal pain: Secondary | ICD-10-CM | POA: Diagnosis not present

## 2017-06-09 DIAGNOSIS — R195 Other fecal abnormalities: Secondary | ICD-10-CM | POA: Diagnosis not present

## 2017-06-09 DIAGNOSIS — R197 Diarrhea, unspecified: Secondary | ICD-10-CM | POA: Diagnosis not present

## 2017-06-09 DIAGNOSIS — Z79899 Other long term (current) drug therapy: Secondary | ICD-10-CM | POA: Diagnosis not present

## 2017-06-09 DIAGNOSIS — R112 Nausea with vomiting, unspecified: Secondary | ICD-10-CM | POA: Diagnosis not present

## 2017-06-09 LAB — URINALYSIS, ROUTINE W REFLEX MICROSCOPIC
BILIRUBIN URINE: NEGATIVE
Bacteria, UA: NONE SEEN
Glucose, UA: NEGATIVE mg/dL
KETONES UR: NEGATIVE mg/dL
LEUKOCYTES UA: NEGATIVE
Nitrite: NEGATIVE
PH: 5 (ref 5.0–8.0)
PROTEIN: NEGATIVE mg/dL
Specific Gravity, Urine: 1.017 (ref 1.005–1.030)

## 2017-06-09 LAB — CBC
HEMATOCRIT: 47.6 % (ref 39.0–52.0)
HEMOGLOBIN: 16.4 g/dL (ref 13.0–17.0)
MCH: 31.9 pg (ref 26.0–34.0)
MCHC: 34.5 g/dL (ref 30.0–36.0)
MCV: 92.6 fL (ref 78.0–100.0)
PLATELETS: 270 10*3/uL (ref 150–400)
RBC: 5.14 MIL/uL (ref 4.22–5.81)
RDW: 11.9 % (ref 11.5–15.5)
WBC: 12.9 10*3/uL — AB (ref 4.0–10.5)

## 2017-06-09 LAB — COMPREHENSIVE METABOLIC PANEL
ALT: 47 U/L (ref 17–63)
AST: 45 U/L — ABNORMAL HIGH (ref 15–41)
Albumin: 4.5 g/dL (ref 3.5–5.0)
Alkaline Phosphatase: 110 U/L (ref 38–126)
Anion gap: 11 (ref 5–15)
BUN: 11 mg/dL (ref 6–20)
CHLORIDE: 100 mmol/L — AB (ref 101–111)
CO2: 24 mmol/L (ref 22–32)
CREATININE: 1.33 mg/dL — AB (ref 0.61–1.24)
Calcium: 9.8 mg/dL (ref 8.9–10.3)
Glucose, Bld: 132 mg/dL — ABNORMAL HIGH (ref 65–99)
Potassium: 4.5 mmol/L (ref 3.5–5.1)
SODIUM: 135 mmol/L (ref 135–145)
Total Bilirubin: 0.9 mg/dL (ref 0.3–1.2)
Total Protein: 8 g/dL (ref 6.5–8.1)

## 2017-06-09 LAB — LIPASE, BLOOD: LIPASE: 30 U/L (ref 11–51)

## 2017-06-09 MED ORDER — HYDROMORPHONE HCL 1 MG/ML IJ SOLN
1.0000 mg | INTRAMUSCULAR | Status: DC | PRN
  Administered 2017-06-09: 1 mg via INTRAVENOUS
  Filled 2017-06-09: qty 1

## 2017-06-09 MED ORDER — ONDANSETRON 4 MG PO TBDP
4.0000 mg | ORAL_TABLET | Freq: Once | ORAL | Status: AC | PRN
  Administered 2017-06-09: 4 mg via ORAL
  Filled 2017-06-09: qty 1

## 2017-06-09 MED ORDER — DICYCLOMINE HCL 20 MG PO TABS: 20.0000 mg | ORAL_TABLET | Freq: Two times a day (BID) | ORAL | 0 refills | Status: DC

## 2017-06-09 MED ORDER — CIPROFLOXACIN HCL 500 MG PO TABS
500.0000 mg | ORAL_TABLET | Freq: Once | ORAL | Status: AC
  Administered 2017-06-09: 500 mg via ORAL
  Filled 2017-06-09: qty 1

## 2017-06-09 MED ORDER — HYDROCODONE-ACETAMINOPHEN 5-325 MG PO TABS: 1.0000 | ORAL_TABLET | ORAL | 0 refills | Status: DC | PRN

## 2017-06-09 MED ORDER — GLYCOPYRROLATE 0.2 MG/ML IJ SOLN
0.2000 mg | Freq: Once | INTRAMUSCULAR | Status: AC
  Administered 2017-06-09: 0.2 mg via INTRAVENOUS
  Filled 2017-06-09: qty 1

## 2017-06-09 MED ORDER — IOPAMIDOL (ISOVUE-300) INJECTION 61%
INTRAVENOUS | Status: AC
  Administered 2017-06-09: 100 mL via INTRAVENOUS
  Filled 2017-06-09: qty 100

## 2017-06-09 MED ORDER — CIPROFLOXACIN HCL 500 MG PO TABS: 500.0000 mg | ORAL_TABLET | Freq: Two times a day (BID) | ORAL | 0 refills | Status: DC

## 2017-06-09 MED ORDER — ONDANSETRON 4 MG PO TBDP: 4.0000 mg | ORAL_TABLET | Freq: Three times a day (TID) | ORAL | 0 refills | Status: DC | PRN

## 2017-06-09 MED ORDER — METRONIDAZOLE 500 MG PO TABS: 500.0000 mg | ORAL_TABLET | Freq: Two times a day (BID) | ORAL | 0 refills | Status: DC

## 2017-06-09 MED ORDER — MORPHINE SULFATE (PF) 4 MG/ML IV SOLN
4.0000 mg | INTRAVENOUS | Status: DC | PRN
  Administered 2017-06-09: 4 mg via INTRAVENOUS
  Filled 2017-06-09: qty 1

## 2017-06-09 MED ORDER — ONDANSETRON HCL 4 MG/2ML IJ SOLN
4.0000 mg | Freq: Once | INTRAMUSCULAR | Status: AC
  Administered 2017-06-09: 4 mg via INTRAVENOUS
  Filled 2017-06-09: qty 2

## 2017-06-09 MED ORDER — SODIUM CHLORIDE 0.9 % IV BOLUS (SEPSIS)
1000.0000 mL | Freq: Once | INTRAVENOUS | Status: AC
  Administered 2017-06-09: 1000 mL via INTRAVENOUS

## 2017-06-09 MED ORDER — METRONIDAZOLE 500 MG PO TABS
500.0000 mg | ORAL_TABLET | Freq: Once | ORAL | Status: AC
  Administered 2017-06-09: 500 mg via ORAL
  Filled 2017-06-09: qty 1

## 2017-06-09 NOTE — ED Notes (Signed)
Patient transported to CT 

## 2017-06-09 NOTE — Discharge Instructions (Signed)
Mostly liquids.  Low fiber diet, toast, dairy, applesauce, etc.  Avoid large meals. Expect slow improvement of your bleeding and symptoms. Return here with any new or worsening symptoms.

## 2017-06-09 NOTE — ED Notes (Signed)
Pt's IV removed, per Dr. Fayrene FearingJames.

## 2017-06-09 NOTE — ED Triage Notes (Signed)
Pt reports onset this am of lower abd cramping. Had several bowel movements then diarrhea that was bloody. Had episode of n/v x 1 this am and more blood noted in stools. Denies any hx of same.

## 2017-06-09 NOTE — ED Provider Notes (Signed)
[facility] EMERGENCY DEPARTMENT Provider Note   CSN: [id number] Arrival date & time: 06/09/17  4098     History   Chief Complaint Chief Complaint  Patient presents with  . Abdominal Pain  . Rectal Bleeding    HPI Jeremy Diaz is a 52 y.o. male.  Chief complaint is abdominal pain, rectal bleeding.  HPI Jeremy Diaz is a 52 year old male.  History of hypertension and reflux.  He had normal colonoscopy December 19.  He was feeling well yesterday.  He awakened at about 1 AM with abdominal pain.  Felt the need to have a bowel movement.  Had a formed stool.  About an hour later had a stool that was loose with blood.  Continued abdominal cramping.  Primarily bilateral lower abdomen without localization.  Sweats and chills.  Continued pain.  Bleeding has lessened but does persist with loose stools.  Reports no recent travel.  No recent antibiotic use.  He was not told per his GI doctor about diverticuli.  Past Medical History:  Diagnosis Date  . Anxiety   . GERD (gastroesophageal reflux disease) 03-27-11   tx. Nexium  . History of inguinal hernia repair    BIH  . Seasonal allergies     Patient Active Problem List   Diagnosis Date Noted  . Acute medial meniscal tear 10/12/2015    Past Surgical History:  Procedure Laterality Date  . INGUINAL HERNIA REPAIR  04/02/2011   Procedure: LAPAROSCOPIC BILATERAL INGUINAL HERNIA REPAIR;  Surgeon: Adolph Pollack, MD;  Location: [facility];  Service: General;  Laterality: Bilateral;  laparoscopic repair bilateral inguinal hernia  . KNEE ARTHROSCOPY Left 10/12/2015   Procedure: LEFT ARTHROSCOPY KNEE WITH MENISCAL DEBRIDEMENT;  Surgeon: Ollen Gross, MD;  Location: [facility];  Service: Orthopedics;  Laterality: Left;  . KNEE SURGERY  2009   right  . NOSE SURGERY  200/2003/2008/2012  . SHOULDER SURGERY  2004   left  . VARICOCELE EXCISION  2000       Home Medications    Prior to Admission medications   Medication Sig Start Date  End Date Taking? Authorizing Provider  AMBULATORY NON FORMULARY MEDICATION Inject into the muscle 2 (two) times a week. Medication Name:allergy shots    [provider]  buPROPion (WELLBUTRIN XL) 150 MG 24 hr tablet Take 150 mg by mouth daily.    [provider]  ciprofloxacin (CIPRO) 500 MG tablet Take 1 tablet (500 mg total) by mouth every 12 (twelve) hours. 06/09/17   Rolland Porter, MD  dicyclomine (BENTYL) 20 MG tablet Take 1 tablet (20 mg total) by mouth 2 (two) times daily. 06/09/17   Rolland Porter, MD  EPINEPHrine (EPIPEN 2-PAK) 0.3 mg/0.3 mL IJ SOAJ injection Inject 0.3 mg into the muscle once as needed (For anaphylaxis.).  04/30/13   [provider]  esomeprazole (NEXIUM) 40 MG capsule Take 40 mg by mouth daily before breakfast.     [provider]  HYDROcodone-acetaminophen (NORCO/VICODIN) 5-325 MG tablet Take 1 tablet by mouth every 4 (four) hours as needed. 06/09/17   Rolland Porter, MD  ibuprofen (ADVIL,MOTRIN) 200 MG tablet Take 600 mg by mouth every 6 (six) hours as needed (For knee pain.).    [provider]  lisinopril-hydrochlorothiazide (PRINZIDE,ZESTORETIC) 10-12.5 MG tablet Take 1 tablet by mouth daily.    [provider]  methocarbamol (ROBAXIN) 500 MG tablet Take 1 tablet (500 mg total) by mouth 4 (four) times daily. As needed for muscle spasm 10/12/15   Aluisio,  Homero FellersFrank, MD  metroNIDAZOLE (FLAGYL) 500 MG tablet Take 1 tablet (500 mg total) by mouth 2 (two) times daily. 06/09/17   Rolland PorterJames, Raysean Graumann, MD  ondansetron (ZOFRAN ODT) 4 MG disintegrating tablet Take 1 tablet (4 mg total) by mouth every 8 (eight) hours as needed for nausea. 06/09/17   Rolland PorterJames, Olivea Sonnen, MD  pantoprazole (PROTONIX) 40 MG tablet Take 1 tablet (40 mg total) by mouth 2 (two) times daily. 06/17/16 07/17/16  Angelina OkFranasiak, Ryan, MD  pravastatin (PRAVACHOL) 40 MG tablet Take 40 mg by mouth daily.    [provider]  sucralfate (CARAFATE) 1 g tablet Take 1 tablet (1 g total) by  mouth 4 (four) times daily -  with meals and at bedtime. 06/17/16 07/17/16  Angelina OkFranasiak, Ryan, MD    Family History History reviewed. No pertinent family history.  Social History Social History   Tobacco Use  . Smoking status: Never Smoker  . Smokeless tobacco: Never Used  Substance Use Topics  . Alcohol use: No  . Drug use: No     Allergies   Eggs or egg-derived products; Amoxil [amoxicillin trihydrate]; Banana; and Skelaxin   Review of Systems Review of Systems  Constitutional: Negative for appetite change, chills, diaphoresis, fatigue and fever.  HENT: Negative for mouth sores, sore throat and trouble swallowing.   Eyes: Negative for visual disturbance.  Respiratory: Negative for cough, chest tightness, shortness of breath and wheezing.   Cardiovascular: Negative for chest pain.  Gastrointestinal: Positive for abdominal distention, blood in stool, diarrhea, nausea and vomiting. Negative for abdominal pain.  Endocrine: Negative for polydipsia, polyphagia and polyuria.  Genitourinary: Negative for dysuria, frequency and hematuria.  Musculoskeletal: Negative for gait problem.  Skin: Negative for color change, pallor and rash.  Neurological: Negative for dizziness, syncope, light-headedness and headaches.  Hematological: Does not bruise/bleed easily.  Psychiatric/Behavioral: Negative for behavioral problems and confusion.     Physical Exam Updated Vital Signs BP 139/79   Pulse 60   Temp 98.6 F (37 C) (Oral)   Resp 16   Ht 5\' 7"  (1.702 m)   Wt 79.4 kg (175 lb)   SpO2 98%   BMI 27.41 kg/m   Physical Exam  Constitutional: He is oriented to person, place, and time. He appears well-developed and well-nourished. No distress.  HENT:  Head: Normocephalic.  Eyes: Conjunctivae are normal. Pupils are equal, round, and reactive to light. No scleral icterus.  Neck: Normal range of motion. Neck supple. No thyromegaly present.  Cardiovascular: Normal rate and regular rhythm.  Exam reveals no gallop and no friction rub.  No murmur heard. Pulmonary/Chest: Effort normal and breath sounds normal. No respiratory distress. He has no wheezes. He has no rales.  Abdominal: Soft. Bowel sounds are normal. He exhibits no distension. There is no tenderness. There is no rebound.  Indicates his bilateral lower abdomen as area of discomfort.  No pre-reproducible tenderness on exam.  Musculoskeletal: Normal range of motion.  Neurological: He is alert and oriented to person, place, and time.  Skin: Skin is warm and dry. No rash noted.  Psychiatric: He has a normal mood and affect. His behavior is normal.     ED Treatments / Results  Labs (all labs ordered are listed, but only abnormal results are displayed) Labs Reviewed  COMPREHENSIVE METABOLIC PANEL - Abnormal; Notable for the following components:      Result Value   Chloride 100 (*)    Glucose, Bld 132 (*)    Creatinine, Ser 1.33 (*)  AST 45 (*)    All other components within normal limits  CBC - Abnormal; Notable for the following components:   WBC 12.9 (*)    All other components within normal limits  URINALYSIS, ROUTINE W REFLEX MICROSCOPIC - Abnormal; Notable for the following components:   Hgb urine dipstick SMALL (*)    Squamous Epithelial / LPF 0-5 (*)    All other components within normal limits  LIPASE, BLOOD    EKG  EKG Interpretation None       Radiology Ct Abdomen Pelvis W Contrast  Result Date: 06/09/2017 CLINICAL DATA:  52 year old with lower abdominal cramping. Bloody diarrhea. History of bilateral hernia repair. EXAM: CT ABDOMEN AND PELVIS WITH CONTRAST TECHNIQUE: Multidetector CT imaging of the abdomen and pelvis was performed using the standard protocol following bolus administration of intravenous contrast. CONTRAST:  ISOVUE-300 IOPAMIDOL (ISOVUE-300) INJECTION 61% COMPARISON:  03/08/2014 FINDINGS: Lower chest: Lung bases are clear.  Small hiatal hernia. Hepatobiliary: Normal  appearance of the liver, gallbladder and portal venous system. No biliary dilatation. Pancreas: Normal appearance of the pancreas without inflammation or duct dilatation. Spleen: Normal appearance of spleen without enlargement. Adrenals/Urinary Tract: Normal adrenal glands. Normal urinary bladder. No hydronephrosis. Probable small cyst in the right kidney lower pole. No suspicious renal lesions. Stomach/Bowel: Small hiatal hernia. Normal appearance of the duodenum. Small bowel loops are decompressed without inflammatory changes. Normal appendix. Majority of the colon is decompressed but there is concern for mild wall thickening involving the descending colon and splenic flexure. Query minimal pericolonic edema around the descending colon on sequence 8, image 13. Vascular/Lymphatic: Normal caliber of the abdominal aorta without aneurysm or significant atherosclerotic disease. The main mesenteric arteries are patent. No lymph node enlargement in the abdomen or pelvis. Reproductive: Prostate is unremarkable with a few calcifications. Other: Postsurgical changes at the groins compatible with bilateral hernia repair. Small amount of fat in the left inguinal canal is unchanged. Negative for free fluid. Negative for free air. Musculoskeletal: No acute bone abnormality. Disc space narrowing at L5-S1. IMPRESSION: Concern for mild edema and wall thickening involving the descending colon and splenic flexure. Findings are suggestive for mild colitis based on the clinical history. Electronically Signed   By: Richarda Overlie M.D.   On: 06/09/2017 15:25    Procedures Procedures (including critical care time)  Medications Ordered in ED Medications  morphine 4 MG/ML injection 4 mg (4 mg Intravenous Given 06/09/17 1319)  HYDROmorphone (DILAUDID) injection 1 mg (1 mg Intravenous Given 06/09/17 1524)  metroNIDAZOLE (FLAGYL) tablet 500 mg (not administered)  ciprofloxacin (CIPRO) tablet 500 mg (not administered)  ondansetron  (ZOFRAN-ODT) disintegrating tablet 4 mg (4 mg Oral Given 06/09/17 1011)  ondansetron (ZOFRAN) injection 4 mg (4 mg Intravenous Given 06/09/17 1319)  sodium chloride 0.9 % bolus 1,000 mL (0 mLs Intravenous Stopped 06/09/17 1525)  glycopyrrolate (ROBINUL) injection 0.2 mg (0.2 mg Intravenous Given 06/09/17 1319)  iopamidol (ISOVUE-300) 61 % injection (100 mLs Intravenous Contrast Given 06/09/17 1501)     Initial Impression / Assessment and Plan / ED Course  I have reviewed the triage vital signs and the nursing notes.  Pertinent labs & imaging results that were available during my care of the patient were reviewed by me and considered in my medical decision making (see chart for details).    Plan fluids, pain medication, CT.  Probable colitis.  Final Clinical Impressions(s) / ED Diagnoses   Final diagnoses:  Colitis    CT scan shows descending colitis without tolerating  symptoms better after IV fluids and pain medication.  Etiology and treatment discussed.  Given p.o. Cipro Flagyl.  Discharged home with prescriptions for Cipro, Flagyl, Bentyl, Zofran, hydrocodone.  Mostly liquid and low residue diet until symptoms improved.  I discussed with him to expect slow improvement and ultimate resolution of bleeding.  Primary care if not improving.  ED Discharge Orders        Ordered    ciprofloxacin (CIPRO) 500 MG tablet  Every 12 hours     06/09/17 1542    metroNIDAZOLE (FLAGYL) 500 MG tablet  2 times daily     06/09/17 1542    HYDROcodone-acetaminophen (NORCO/VICODIN) 5-325 MG tablet  Every 4 hours PRN     06/09/17 1542    ondansetron (ZOFRAN ODT) 4 MG disintegrating tablet  Every 8 hours PRN     06/09/17 1542    dicyclomine (BENTYL) 20 MG tablet  2 times daily     06/09/17 1542       Rolland Porter, MD 06/09/17 [phone]

## 2017-06-17 DIAGNOSIS — K529 Noninfective gastroenteritis and colitis, unspecified: Secondary | ICD-10-CM | POA: Diagnosis not present

## 2017-06-17 DIAGNOSIS — K219 Gastro-esophageal reflux disease without esophagitis: Secondary | ICD-10-CM | POA: Diagnosis not present

## 2017-06-17 DIAGNOSIS — R197 Diarrhea, unspecified: Secondary | ICD-10-CM | POA: Diagnosis not present

## 2017-07-09 NOTE — Progress Notes (Signed)
Talked with patient to confirm esophageal manometry with ph study. Pt will be here between 8-8:15.  Reminded patient not to eat or drink anything within 6 hours prior.

## 2017-07-10 ENCOUNTER — Ambulatory Visit (HOSPITAL_COMMUNITY)
Admission: RE | Admit: 2017-07-10 | Discharge: 2017-07-10 | Disposition: A | Discharge status: Home / Self Care | Payer: BLUE CROSS/BLUE SHIELD | Source: Ambulatory Visit | Attending: Gastroenterology | Admitting: Gastroenterology

## 2017-07-10 ENCOUNTER — Encounter (HOSPITAL_COMMUNITY)
Admission: RE | Disposition: A | Discharge status: Home / Self Care | Payer: Self-pay | Source: Ambulatory Visit | Attending: Gastroenterology

## 2017-07-10 DIAGNOSIS — K449 Diaphragmatic hernia without obstruction or gangrene: Secondary | ICD-10-CM | POA: Diagnosis not present

## 2017-07-10 DIAGNOSIS — K219 Gastro-esophageal reflux disease without esophagitis: Secondary | ICD-10-CM | POA: Diagnosis not present

## 2017-07-10 DIAGNOSIS — F419 Anxiety disorder, unspecified: Secondary | ICD-10-CM | POA: Diagnosis not present

## 2017-07-10 DIAGNOSIS — Z880 Allergy status to penicillin: Secondary | ICD-10-CM | POA: Diagnosis not present

## 2017-07-10 DIAGNOSIS — Z79899 Other long term (current) drug therapy: Secondary | ICD-10-CM | POA: Diagnosis not present

## 2017-07-10 HISTORY — PX: ESOPHAGEAL MANOMETRY: SHX5429

## 2017-07-10 HISTORY — PX: PH IMPEDANCE STUDY: SHX5565

## 2017-07-10 SURGERY — MANOMETRY, ESOPHAGUS

## 2017-07-10 MED ORDER — LIDOCAINE VISCOUS 2 % MT SOLN
OROMUCOSAL | Status: AC
  Filled 2017-07-10: qty 15

## 2017-07-10 SURGICAL SUPPLY — 2 items
FACESHIELD LNG OPTICON STERILE (SAFETY) IMPLANT
GLOVE BIO SURGEON STRL SZ8 (GLOVE) ×4 IMPLANT

## 2017-07-10 NOTE — Progress Notes (Signed)
Esophageal manometry performed per protocol.  Patient tolerated procedure without complications.  PH probe placed per protocol at 38cm.  Patient tolerated placement without complications.  Patient instructed on use of recorder and provided with instructions and a journal.  Patient verbalized understanding and denied any other needs.  Instructed to return to department on tomorrow after 0905.

## 2017-07-11 ENCOUNTER — Encounter (HOSPITAL_COMMUNITY): Payer: Self-pay | Admitting: Gastroenterology

## 2017-07-11 NOTE — Progress Notes (Signed)
Patient returned to complete pH study.  Probe removed without complications.  Journal and recorder returned intact.

## 2017-07-12 DIAGNOSIS — R1013 Epigastric pain: Secondary | ICD-10-CM | POA: Diagnosis not present

## 2017-07-12 DIAGNOSIS — K219 Gastro-esophageal reflux disease without esophagitis: Secondary | ICD-10-CM | POA: Diagnosis not present

## 2017-07-12 DIAGNOSIS — R079 Chest pain, unspecified: Secondary | ICD-10-CM | POA: Diagnosis not present

## 2017-07-12 NOTE — H&P (Signed)
Jeremy DurhamBrian T Diaz HPI: The patient continues to have GERD symptoms on BID PPI.  He reports a burning sensation in the epigastric region.  There was no benefit with the use of imipramine for EPS.  He states that he needs to cough in the evenings to help with his symptoms.  Last year the EGD was revealing for a 2 cm hiatal hernia.  Past Medical History:  Diagnosis Date  . Anxiety   . GERD (gastroesophageal reflux disease) 03-27-11   tx. Nexium  . History of inguinal hernia repair    BIH  . Seasonal allergies     Past Surgical History:  Procedure Laterality Date  . ESOPHAGEAL MANOMETRY N/A 07/10/2017   Procedure: ESOPHAGEAL MANOMETRY (EM);  Surgeon: Jeani HawkingHung, Arnola Crittendon, MD;  Location: [facility];  Service: Endoscopy;  Laterality: N/A;  . INGUINAL HERNIA REPAIR  04/02/2011   Procedure: LAPAROSCOPIC BILATERAL INGUINAL HERNIA REPAIR;  Surgeon: Adolph Pollackodd J Rosenbower, MD;  Location: [facility];  Service: General;  Laterality: Bilateral;  laparoscopic repair bilateral inguinal hernia  . KNEE ARTHROSCOPY Left 10/12/2015   Procedure: LEFT ARTHROSCOPY KNEE WITH MENISCAL DEBRIDEMENT;  Surgeon: Ollen GrossFrank Aluisio, MD;  Location: [facility];  Service: Orthopedics;  Laterality: Left;  . KNEE SURGERY  2009   right  . NOSE SURGERY  200/2003/2008/2012  . PH IMPEDANCE STUDY N/A 07/10/2017   Procedure: PH IMPEDANCE STUDY;  Surgeon: Jeani HawkingHung, Elisabel Hanover, MD;  Location: [facility];  Service: Endoscopy;  Laterality: N/A;  . SHOULDER SURGERY  2004   left  . VARICOCELE EXCISION  2000    No family history on file.  Social History:  reports that  has never smoked. he has never used smokeless tobacco. He reports that he does not drink alcohol or use drugs.  Allergies:  Allergies  Allergen Reactions  . Eggs Or Egg-Derived Products Anaphylaxis  . Amoxil [Amoxicillin Trihydrate] Itching and Other (See Comments)    Has patient had a PCN reaction causing immediate rash, facial/tongue/throat swelling, SOB or lightheadedness with hypotension:  no Has patient had a PCN reaction causing severe rash involving mucus membranes or skin necrosis: no Has patient had a PCN reaction that required hospitalization no Has patient had a PCN reaction occurring within the last 10 years: yes If all of the above answers are "NO", then may proceed with Cephalosporin use.  . Banana Itching  . Skelaxin Itching    Medications: Scheduled: Continuous:  No results found for this or any previous visit (from the past 24 hour(s)).   No results found.  ROS:  As stated above in the HPI otherwise negative.  There were no vitals taken for this visit.    PE: Gen: NAD, Alert and Oriented HEENT:  [place]/AT, EOMI Neck: Supple, no LAD Lungs: CTA Bilaterally CV: RRR without M/G/R ABM: Soft, NTND, +BS Ext: No C/C/E  Assessment/Plan: 1) GERD - Manometry and impedence pH on BID PPI.  Jeremy Diaz D 07/12/2017, 7:42 AM

## 2017-08-12 ENCOUNTER — Ambulatory Visit: Payer: Self-pay | Admitting: Surgery

## 2017-08-12 DIAGNOSIS — K449 Diaphragmatic hernia without obstruction or gangrene: Secondary | ICD-10-CM | POA: Diagnosis not present

## 2017-08-12 DIAGNOSIS — K219 Gastro-esophageal reflux disease without esophagitis: Secondary | ICD-10-CM | POA: Diagnosis not present

## 2017-08-12 NOTE — H&P (Signed)
Jeremy DurhamBrian T Diaz Documented: 08/12/2017 4:14 PM Location: [facility] Patient #: [id number] DOB: [birth year] Married / Language: [place] / Race: White Male  History of Present Illness Jeremy Diaz(Katlynn Naser C. Jakayla Schweppe MD; 08/12/2017 4:54 PM) The patient is a 52 year old male who presents with a hiatal hernia. Note for "Hiatal hernia": ` ` ` Patient sent for surgical consultation at the request of Dr. Jeani HawkingPatrick Hung  Chief Complaint: Worsening heartburn and reflux with hiatal hernia  The patient is a pleasant active male. Works in a warehouse with minor activity. He struggle with heartburn for many years. To take Tums for the past decade. Worsened. Started on proton pump inhibitor. He is tried many different times. Increased to twice a day.. EGD showed small hiatal hernia. Has been taking up to 6 PPI pills a day. Still also some Tums. Struggling with heartburn and reflux. Now every time he eats or drinks he feels like somethings coming up. Notice it when he bends over as well. He dances sleep on his side somewhat elevated. Not needing a lot of pillows. He moves his bowels every day. Burping and belching. Usually meat and occasionally greasy spicy foods. He did have some abdominal complaints going more to the right side. Saw Dr. Abbey Chattersosenbower about 2 years ago. Normal ultrasound. Normal HIDA scan. Recurrent symptoms last year. Normal gastric emptying study. Reassured. However symptoms of worsened. PPH and impedance suspicious for persistent reflux. Esophageal manometry normal. Because of maximal medical therapy with persistent symptoms and unlikely other etiology, surgical consultation requested to see if he would benefit from fundoplication/hiatal hernia repair  No personal nor family history of GI/colon cancer, inflammatory bowel disease, irritable bowel syndrome, allergy such as Celiac Sprue, dietary/dairy problems, colitis, ulcers nor gastritis. No recent sick contacts/gastroenteritis.  No travel outside the country. No changes in diet. No dysphagia to solids or liquids. No significant heartburn or reflux. No hematochezia, hematemesis, coffee ground emesis. No evidence of prior gastric/peptic ulceration. No exertional chest pain or shortness of breath. No asthma some hypertension usually controlled. Occasionally takes nonsteroidals but not on a daily basis.  (Review of systems as stated in this history (HPI) or in the review of systems. Otherwise all other 12 point ROS are negative) ` ` `   Allergies (Tanisha A. Manson PasseyBrown, RMA; 08/12/2017 4:15 PM) Banana (Diagnostic) *DIAGNOSTIC PRODUCTS* Amoxicillin *PENICILLINS* Skelaxin *MUSCULOSKELETAL THERAPY AGENTS* Eggs Allergies Reconciled  Medication History (Tanisha A. Manson PasseyBrown, RMA; 08/12/2017 4:15 PM) BuPROPion HCl ER (XL) (150MG  Tablet ER 24HR, Oral daily) Active. NexIUM (40MG  Capsule DR, Oral daily) Active. Lisinopril-Hydrochlorothiazide (10-12.5MG  Tablet, Oral) Active. Pravastatin Sodium (40MG  Tablet, Oral) Active. Medications Reconciled    Vitals (Tanisha A. Brown RMA; 08/12/2017 4:15 PM) 08/12/2017 4:14 PM Weight: 171.4 lb Height: 67in Body Surface Area: 1.89 m Body Mass Index: 26.84 kg/m  Temp.: 98.74F  Pulse: 84 (Regular)  BP: 112/82 (Sitting, Left Arm, Standard)      Physical Exam Jeremy Diaz(Calib Wadhwa C. Dreyden Rohrman MD; 08/12/2017 4:50 PM)  General Mental Status-Alert. General Appearance-Not in acute distress, Not Sickly. Orientation-Oriented X3. Hydration-Well hydrated. Voice-Normal.  Integumentary Global Assessment Upon inspection and palpation of skin surfaces of the - Axillae: non-tender, no inflammation or ulceration, no drainage. and Distribution of scalp and body hair is normal. General Characteristics Temperature - normal warmth is noted.  Head and Neck Head-normocephalic, atraumatic with no lesions or palpable masses. Face Global Assessment - atraumatic, no absence of  expression. Neck Global Assessment - no abnormal movements, no bruit auscultated on the right, no bruit auscultated on  the left, no decreased range of motion, non-tender. Trachea-midline. Thyroid Gland Characteristics - non-tender.  Eye Eyeball - Left-Extraocular movements intact, No Nystagmus. Eyeball - Right-Extraocular movements intact, No Nystagmus. Cornea - Left-No Hazy. Cornea - Right-No Hazy. Sclera/Conjunctiva - Left-No scleral icterus, No Discharge. Sclera/Conjunctiva - Right-No scleral icterus, No Discharge. Pupil - Left-Direct reaction to light normal. Pupil - Right-Direct reaction to light normal.  ENMT Ears Pinna - Left - no drainage observed, no generalized tenderness observed. Right - no drainage observed, no generalized tenderness observed. Nose and Sinuses External Inspection of the Nose - no destructive lesion observed. Inspection of the nares - Left - quiet respiration. Right - quiet respiration. Mouth and Throat Lips - Upper Lip - no fissures observed, no pallor noted. Lower Lip - no fissures observed, no pallor noted. Nasopharynx - no discharge present. Oral Cavity/Oropharynx - Tongue - no dryness observed. Oral Mucosa - no cyanosis observed. Hypopharynx - no evidence of airway distress observed.  Chest and Lung Exam Inspection Movements - Normal and Symmetrical. Accessory muscles - No use of accessory muscles in breathing. Palpation Palpation of the chest reveals - Non-tender. Auscultation Breath sounds - Normal and Clear.  Cardiovascular Auscultation Rhythm - Regular. Murmurs & Other Heart Sounds - Auscultation of the heart reveals - No Murmurs and No Systolic Clicks.  Abdomen Inspection Inspection of the abdomen reveals - No Visible peristalsis and No Abnormal pulsations. Umbilicus - No Bleeding, No Urine drainage. Palpation/Percussion Palpation and Percussion of the abdomen reveal - Soft, Non Tender, No Rebound tenderness, No  Rigidity (guarding) and No Cutaneous hyperesthesia. Note: Abdomen soft. Nontender. Not distended. No umbilical or incisional hernias. No guarding.  Male Genitourinary Sexual Maturity Tanner 5 - Adult hair pattern and Adult penile size and shape. Note: No inguinal hernias. Normal external genitalia. Epididymi, testes, and spermatic cords normal without any masses.  Peripheral Vascular Upper Extremity Inspection - Left - No Cyanotic nailbeds, Not Ischemic. Right - No Cyanotic nailbeds, Not Ischemic.  Neurologic Neurologic evaluation reveals -normal attention span and ability to concentrate, able to name objects and repeat phrases. Appropriate fund of knowledge , normal sensation and normal coordination. Mental Status Affect - not angry, not paranoid. Cranial Nerves-Normal Bilaterally. Gait-Normal.  Neuropsychiatric Mental status exam performed with findings of-able to articulate well with normal speech/language, rate, volume and coherence, thought content normal with ability to perform basic computations and apply abstract reasoning and no evidence of hallucinations, delusions, obsessions or homicidal/suicidal ideation.  Musculoskeletal Global Assessment Spine, Ribs and Pelvis - no instability, subluxation or laxity. Right Upper Extremity - no instability, subluxation or laxity.  Lymphatic Head & Neck  General Head & Neck Lymphatics: Bilateral - Description - No Localized lymphadenopathy. Axillary  General Axillary Region: Bilateral - Description - No Localized lymphadenopathy. Femoral & Inguinal  Generalized Femoral & Inguinal Lymphatics: Left - Description - No Localized lymphadenopathy. Right - Description - No Localized lymphadenopathy.    Assessment & Plan Jeremy Sportsman MD; 08/12/2017 4:57 PM)  PARAESOPHAGEAL HIATAL HERNIA (K44.9) Impression: Rather classic story of worsening heartburn and reflux refractory to maximal medical therapy now with hiatal  hernia. Persistent symptoms and reflux by impedance despite being on proton pump inhibitors. Normal manometry. Normal gastric emptying study. Negative ultrasound and HIDA scan for gallbladder etiology.  I think he would benefit from fundoplication and hiatal hernia repair. Given his relatively young age & in moderate activity level, I would probably do a absorbable mesh reinforcement of the repair to minimize recurrence. Discussed the surgical technique  and dietary needs at length. He is interested in proceeding. 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 I cautioned against him return to work too early. Told him most likely 3 weeks before RTW moderate activity, at 6 weeks = unrestricted.  Current Plans You are being scheduled for surgery- Our schedulers will call you.  You should hear from our office's scheduling department within 5 working days about the location, date, and time of surgery. We try to make accommodations for patient's preferences in scheduling surgery, but sometimes the OR schedule or the surgeon's schedule prevents [place] from making those accommodations.  If you have not heard from our office [phone]) in 5 working days, call the office and ask for your surgeon's nurse.  If you have other questions about your diagnosis, plan, or surgery, call the office and ask for your surgeon's nurse.  Pt Education - Pamphlet Given - Gastroesophagel Reflux Disease: discussed with patient and provided information. The anatomy & physiology of the foregut and anti-reflux mechanism was discussed. The pathophysiology of hiatal herniation and GERD was discussed. Natural history risks without surgery was discussed. The patient's symptoms are not adequately controlled by medicines and other non-operative treatments. I feel the risks of no intervention will lead to serious problems that outweigh the operative risks; therefore, I recommended surgery to reduce the hiatal hernia out of  the chest and fundoplication to rebuild the anti-reflux valve and control reflux better. Need for a thorough workup to rule out the differential diagnosis and plan treatment was explained. I explained laparoscopic techniques with possible need for an open approach.  Risks such as bleeding, infection, abscess, leak, need for further treatment, heart attack, death, and other risks were discussed. I noted a good likelihood this will help address the problem. Goals of post-operative recovery were discussed as well. Possibility that this will not correct all symptoms was explained. Post-operative dysphagia, need for short-term liquid & pureed diet, inability to vomit, possibility of reherniation, possible need for medicines to help control symptoms in addition to surgery were discussed. We will work to minimize complications. Educational handouts further explaining the pathology, treatment options, and dysphagia diet was given as well. Questions were answered. The patient expresses understanding & wishes to proceed with surgery.   GASTROESOPHAGEAL REFLUX DISEASE, ESOPHAGITIS PRESENCE NOT SPECIFIED (K21.9)  Current Plans Pt Education - [facility] Diet HCI (Sanna Porcaro): discussed with patient and provided information. Pt Education - [facility] HCI  Jeremy Diaz, M.D., F.A.C.S. Gastrointestinal and Minimally Invasive Surgery [facility] [address], [address] [place], [place] [address] [phone] Main / Paging

## 2017-10-03 DIAGNOSIS — M7711 Lateral epicondylitis, right elbow: Secondary | ICD-10-CM | POA: Diagnosis not present

## 2017-10-03 DIAGNOSIS — M25521 Pain in right elbow: Secondary | ICD-10-CM | POA: Diagnosis not present

## 2017-10-24 DIAGNOSIS — J069 Acute upper respiratory infection, unspecified: Secondary | ICD-10-CM | POA: Diagnosis not present

## 2017-12-06 DIAGNOSIS — E7800 Pure hypercholesterolemia, unspecified: Secondary | ICD-10-CM | POA: Diagnosis not present

## 2018-03-06 DIAGNOSIS — R0789 Other chest pain: Secondary | ICD-10-CM | POA: Diagnosis not present

## 2018-03-06 DIAGNOSIS — R05 Cough: Secondary | ICD-10-CM | POA: Diagnosis not present

## 2018-03-06 DIAGNOSIS — I10 Essential (primary) hypertension: Secondary | ICD-10-CM | POA: Diagnosis not present

## 2018-03-25 DIAGNOSIS — J209 Acute bronchitis, unspecified: Secondary | ICD-10-CM | POA: Diagnosis not present

## 2018-04-19 DIAGNOSIS — Z125 Encounter for screening for malignant neoplasm of prostate: Secondary | ICD-10-CM | POA: Diagnosis not present

## 2018-04-19 DIAGNOSIS — Z79899 Other long term (current) drug therapy: Secondary | ICD-10-CM | POA: Diagnosis not present

## 2018-04-19 DIAGNOSIS — M545 Low back pain: Secondary | ICD-10-CM | POA: Diagnosis not present

## 2018-04-19 DIAGNOSIS — R1084 Generalized abdominal pain: Secondary | ICD-10-CM | POA: Diagnosis not present

## 2018-04-19 DIAGNOSIS — R109 Unspecified abdominal pain: Secondary | ICD-10-CM | POA: Diagnosis not present

## 2018-04-21 ENCOUNTER — Ambulatory Visit: Payer: Self-pay | Admitting: Surgery

## 2018-04-21 DIAGNOSIS — K219 Gastro-esophageal reflux disease without esophagitis: Secondary | ICD-10-CM | POA: Diagnosis not present

## 2018-04-21 DIAGNOSIS — R490 Dysphonia: Secondary | ICD-10-CM | POA: Insufficient documentation

## 2018-04-21 DIAGNOSIS — K449 Diaphragmatic hernia without obstruction or gangrene: Secondary | ICD-10-CM

## 2018-04-21 NOTE — H&P (Signed)
Jeremy DurhamBrian T Mazzocco Documented: 04/21/2018 4:35 PM Location: [facility] Patient #: [id number] DOB: 1965-12-24 Married / Language: [place] / Race: White Male  History of Present Illness [name](Virjean Boman C. Garfield Coiner MD; 04/21/2018 5:27 PM) The patient is a 52 year old male who presents with a hiatal hernia. Note for "Hiatal hernia": ` ` ` Patient sent for surgical consultation at the request of Dr. Jeani HawkingPatrick Hung  Chief Complaint: Worsening heartburn and reflux with hiatal hernia  Patient returns with his wife. He notes he's had worsening heartburn and reflux issues. Wife concern is becoming more hoarse with sore throat. Has spitting up of bile at times. Remains on twice a day and antacid medications. Doing Tums as well. Still struggling. Was hesitant to consider surgery first but feels like his symptoms have worsened over the past year.   Prior Visit: The patient is a pleasant active male. Works in a warehouse with minor activity. He struggle with heartburn for many years. To take Tums for the past decade. Worsened. Started on proton pump inhibitor. He is tried many different times. Increased to twice a day.. EGD showed small hiatal hernia. Has been taking up to 6 PPI pills a day. Still also some Tums. Struggling with heartburn and reflux. Now every time he eats or drinks he feels like somethings coming up. Notice it when he bends over as well. He dances sleep on his side somewhat elevated. Not needing a lot of pillows. He moves his bowels every day. Burping and belching. Usually meat and occasionally greasy spicy foods. He did have some abdominal complaints going more to the right side. Saw Dr. Abbey Chattersosenbower about 2 years ago. Normal ultrasound. Normal HIDA scan. Recurrent symptoms last year. Normal gastric emptying study. Reassured. However symptoms of worsened. PPH and impedance suspicious for persistent reflux. Esophageal manometry normal. Because of maximal medical therapy  with persistent symptoms and unlikely other etiology, surgical consultation requested to see if he would benefit from fundoplication/hiatal hernia repair  No personal nor family history of GI/colon cancer, inflammatory bowel disease, irritable bowel syndrome, allergy such as Celiac Sprue, dietary/dairy problems, colitis, ulcers nor gastritis. No recent sick contacts/gastroenteritis. No travel outside the country. No changes in diet. No dysphagia to solids or liquids. No significant heartburn or reflux. No hematochezia, hematemesis, coffee ground emesis. No evidence of prior gastric/peptic ulceration. No exertional chest pain or shortness of breath. No asthma some hypertension usually controlled. Occasionally takes nonsteroidals but not on a daily basis.  (Review of systems as stated in this history (HPI) or in the review of systems. Otherwise all other 12 point ROS are negative) ` ` `   Problem List/Past Medical [name](Berniece Abid C. Mackensie Pilson, MD; 04/21/2018 5:23 PM) RIGHT INGUINAL PAIN (R10.31) LOWER ABDOMINAL PAIN, UNSPECIFIED (R10.30) OSTEITIS PUBIS (M86.9) RUQ PAIN (R10.11) PARAESOPHAGEAL HIATAL HERNIA (K44.9) GASTROESOPHAGEAL REFLUX DISEASE, ESOPHAGITIS PRESENCE NOT SPECIFIED (K21.9)  Past Surgical History [name](Meghana Tullo C. Lamarion Mcevers, MD; 04/21/2018 5:23 PM) Knee Surgery Right. Laparoscopic Inguinal Hernia Surgery Left. Shoulder Surgery Left. Vasectomy  Diagnostic Studies History [name](Kadince Boxley C. Atticus Wedin, MD; 04/21/2018 5:23 PM) Colonoscopy 1-5 years ago  Allergies [name](Katasha Riga C. Shamiracle Gorden, MD; 04/21/2018 5:23 PM) Banana (Diagnostic) *DIAGNOSTIC PRODUCTS* Amoxicillin *PENICILLINS* Skelaxin *MUSCULOSKELETAL THERAPY AGENTS* Eggs Allergies Reconciled  Medication History ([name], CMA; 04/21/2018 4:36 PM) BuPROPion HCl ER (XL) (150MG  Tablet ER 24HR, Oral daily) Active. NexIUM (40MG  Capsule DR, Oral daily) Active. Lisinopril-Hydrochlorothiazide (10-12.5MG  Tablet, Oral)  Active. Pravastatin Sodium (40MG  Tablet, Oral) Active. Medications Reconciled  Social History [name](Sheina Mcleish C. Bryker Fletchall, MD; 04/21/2018 5:23 PM)  Caffeine use Carbonated beverages. No alcohol use No drug use Tobacco use Never smoker.  Family History [name], MD; 04/21/2018 5:23 PM) Hypertension Father.  Other Problems [name], MD; 04/21/2018 5:23 PM) Anxiety Disorder Gastroesophageal Reflux Disease Inguinal Hernia Other disease, cancer, significant illness Unspecified Diagnosis    Vitals ([name] CMA; 04/21/2018 4:36 PM) 04/21/2018 4:35 PM Weight: 172 lb Height: 67in Body Surface Area: 1.9 m Body Mass Index: 26.94 kg/m  Temp.: 98.58F  Pulse: 83 (Regular)  P.OX: 99% (Room air) BP: 116/68 (Sitting, Left Arm, Standard)      Physical Exam [name] MD; 04/21/2018 5:23 PM)  General Mental Status-Alert. General Appearance-Not in acute distress, Not Sickly. Orientation-Oriented X3. Hydration-Well hydrated. Voice-Normal.  Integumentary Global Assessment Upon inspection and palpation of skin surfaces of the - Axillae: non-tender, no inflammation or ulceration, no drainage. and Distribution of scalp and body hair is normal. General Characteristics Temperature - normal warmth is noted.  Head and Neck Head-normocephalic, atraumatic with no lesions or palpable masses. Face Global Assessment - atraumatic, no absence of expression. Neck Global Assessment - no abnormal movements, no bruit auscultated on the right, no bruit auscultated on the left, no decreased range of motion, non-tender. Trachea-midline. Thyroid Gland Characteristics - non-tender.  Eye Eyeball - Left-Extraocular movements intact, No Nystagmus. Eyeball - Right-Extraocular movements intact, No Nystagmus. Cornea - Left-No Hazy. Cornea - Right-No Hazy. Sclera/Conjunctiva - Left-No scleral icterus, No Discharge. Sclera/Conjunctiva -  Right-No scleral icterus, No Discharge. Pupil - Left-Direct reaction to light normal. Pupil - Right-Direct reaction to light normal.  ENMT Ears Pinna - Left - no drainage observed, no generalized tenderness observed. Right - no drainage observed, no generalized tenderness observed. Nose and Sinuses External Inspection of the Nose - no destructive lesion observed. Inspection of the nares - Left - quiet respiration. Right - quiet respiration. Mouth and Throat Lips - Upper Lip - no fissures observed, no pallor noted. Lower Lip - no fissures observed, no pallor noted. Nasopharynx - no discharge present. Oral Cavity/Oropharynx - Tongue - no dryness observed. Oral Mucosa - no cyanosis observed. Hypopharynx - no evidence of airway distress observed. Note: Some mild hoarseness of his voice.  Chest and Lung Exam Inspection Movements - Normal and Symmetrical. Accessory muscles - No use of accessory muscles in breathing. Palpation Palpation of the chest reveals - Non-tender. Auscultation Breath sounds - Normal and Clear.  Cardiovascular Auscultation Rhythm - Regular. Murmurs & Other Heart Sounds - Auscultation of the heart reveals - No Murmurs and No Systolic Clicks.  Abdomen Inspection Inspection of the abdomen reveals - No Visible peristalsis and No Abnormal pulsations. Umbilicus - No Bleeding, No Urine drainage. Palpation/Percussion Palpation and Percussion of the abdomen reveal - Soft, Non Tender, No Rebound tenderness, No Rigidity (guarding) and No Cutaneous hyperesthesia. Note: Abdomen soft. Mild epigastric discomfort. Mild left upper quadrant discomfort. The rest of the abdomen is Nontender. Not distended. No umbilical or incisional hernias. No guarding.  Male Genitourinary Sexual Maturity Tanner 5 - Adult hair pattern and Adult penile size and shape.  Peripheral Vascular Upper Extremity Inspection - Left - No Cyanotic nailbeds, Not Ischemic. Right - No Cyanotic  nailbeds, Not Ischemic.  Neurologic Neurologic evaluation reveals -normal attention span and ability to concentrate, able to name objects and repeat phrases. Appropriate fund of knowledge , normal sensation and normal coordination. Mental Status Affect - not angry, not paranoid. Cranial Nerves-Normal Bilaterally. Gait-Normal.  Neuropsychiatric Mental status exam performed with findings of-able to articulate well  with normal speech/language, rate, volume and coherence, thought content normal with ability to perform basic computations and apply abstract reasoning and no evidence of hallucinations, delusions, obsessions or homicidal/suicidal ideation.  Musculoskeletal Global Assessment Spine, Ribs and Pelvis - no instability, subluxation or laxity. Right Upper Extremity - no instability, subluxation or laxity.  Lymphatic Head & Neck  General Head & Neck Lymphatics: Bilateral - Description - No Localized lymphadenopathy. Axillary  General Axillary Region: Bilateral - Description - No Localized lymphadenopathy. Femoral & Inguinal  Generalized Femoral & Inguinal Lymphatics: Left - Description - No Localized lymphadenopathy. Right - Description - No Localized lymphadenopathy.    Assessment & Plan [name] MD; 04/21/2018 5:21 PM)  PARAESOPHAGEAL HIATAL HERNIA (K44.9) Impression: Rather classic story of worsening heartburn and reflux refractory to maximal medical therapy now with hiatal hernia.  Persistent symptoms and reflux by impedance despite being on proton pump inhibitors. Worsening symptoms of hoarseness and reflux over the year. He isn't ready to reconsider surgery. His wife came in as well. Discussed with her at length. Concerns about gas bloat and other issues discussed at length. He is exhausted nonsurgical options. He is interested in proceeding.  Normal manometry. Normal gastric emptying study. Hopefully less likely to have severe dysphagia or bloating issues as  a result  Negative ultrasound and HIDA scan for gallbladder etiology.  I think he would benefit from fundoplication and hiatal hernia repair. Given his relatively young age & in moderate activity level, I would probably do a absorbable mesh reinforcement of the repair to minimize recurrence. Discussed the surgical technique and dietary needs at length. He is interested in proceeding. 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 I cautioned against him return to work too early. Told him most likely 3 weeks before RTW moderate activity, at 6 weeks = unrestricted.  Current Plans You are being scheduled for surgery- Our schedulers will call you.  You should hear from our office's scheduling department within 5 working days about the location, date, and time of surgery. We try to make accommodations for patient's preferences in scheduling surgery, but sometimes the OR schedule or the surgeon's schedule prevents [place] from making those accommodations.  If you have not heard from our office [phone]) in 5 working days, call the office and ask for your surgeon's nurse.  If you have other questions about your diagnosis, plan, or surgery, call the office and ask for your surgeon's nurse.  The anatomy & physiology of the foregut and anti-reflux mechanism was discussed. The pathophysiology of hiatal herniation and GERD was discussed. Natural history risks without surgery was discussed. The patient's symptoms are not adequately controlled by medicines and other non-operative treatments. I feel the risks of no intervention will lead to serious problems that outweigh the operative risks; therefore, I recommended surgery to reduce the hiatal hernia out of the chest and fundoplication to rebuild the anti-reflux valve and control reflux better. Need for a thorough workup to rule out the differential diagnosis and plan treatment was explained. I explained laparoscopic techniques with possible  need for an open approach.  Risks such as bleeding, infection, abscess, leak, need for further treatment, heart attack, death, and other risks were discussed. I noted a good likelihood this will help address the problem. Goals of post-operative recovery were discussed as well. Possibility that this will not correct all symptoms was explained. Post-operative dysphagia, need for short-term liquid & pureed diet, inability to vomit, possibility of reherniation, possible need for medicines to help  control symptoms in addition to surgery were discussed. We will work to minimize complications. Educational handouts further explaining the pathology, treatment options, and dysphagia diet was given as well. Questions were answered. The patient expresses understanding & wishes to proceed with surgery.  Pt Education - [facility] Esophageal Surgery Diet HCI (Doaa Kendzierski): discussed with patient and provided information. Pt Education - [facility] Laparoscopic Surgery HCI  GASTROESOPHAGEAL REFLUX DISEASE, ESOPHAGITIS PRESENCE NOT SPECIFIED (K21.9)  Current Plans Pt Education - Pamphlet Given - Gastroesophagel Reflux Disease: discussed with patient and provided information.  Ardeth SportsmanSteven C. Natalia Wittmeyer, MD, FACS, MASCRS Gastrointestinal and Minimally Invasive Surgery    [address] [place] [id number] [phone] Main / Paging [phone] Fax

## 2018-04-21 NOTE — H&P (Signed)
[name] Documented: 04/21/2018 4:35 PM Location: [facility] Patient #: [id number] DOB: 08-10-1965 Married / Language: [place] / Race: White Male  History of Present Illness [name] MD; 04/21/2018 5:30 PM) The patient is a 52 year old male who presents with a hiatal hernia. Note for "Hiatal hernia": ` ` ` Patient sent for surgical consultation at the request of Dr. Jeani Hawking  Chief Complaint: Worsening heartburn and reflux with hiatal hernia  Patient returns with his wife. He notes he's had worsening heartburn and reflux issues. Wife concern is becoming more hoarse with sore throat. Has spitting up of bile at times. Remains on twice a day and antacid medications. Doing Tums as well. Still struggling. Was hesitant to consider surgery first but feels like his symptoms have worsened over the past year.   Prior Visit: The patient is a pleasant active male. Works in a warehouse with minor activity. He struggle with heartburn for many years. To take Tums for the past decade. Worsened. Started on proton pump inhibitor. He is tried many different times. Increased to twice a day.. EGD showed small hiatal hernia. Has been taking up to 6 PPI pills a day. Still also some Tums. Struggling with heartburn and reflux. Now every time he eats or drinks he feels like somethings coming up. Notice it when he bends over as well. He dances sleep on his side somewhat elevated. Not needing a lot of pillows. He moves his bowels every day. Burping and belching. Usually meat and occasionally greasy spicy foods. He did have some abdominal complaints going more to the right side. Saw Dr. Abbey Chatters about 2 years ago. Normal ultrasound. Normal HIDA scan. Recurrent symptoms last year. Normal gastric emptying study. Reassured. However symptoms of worsened. PPH and impedance suspicious for persistent reflux. Esophageal manometry normal. Because of maximal medical therapy  with persistent symptoms and unlikely other etiology, surgical consultation requested to see if he would benefit from fundoplication/hiatal hernia repair  No personal nor family history of GI/colon cancer, inflammatory bowel disease, irritable bowel syndrome, allergy such as Celiac Sprue, dietary/dairy problems, colitis, ulcers nor gastritis. No recent sick contacts/gastroenteritis. No travel outside the country. No changes in diet. No dysphagia to solids or liquids. No significant heartburn or reflux. No hematochezia, hematemesis, coffee ground emesis. No evidence of prior gastric/peptic ulceration. No exertional chest pain or shortness of breath. No asthma some hypertension usually controlled. Occasionally takes nonsteroidals but not on a daily basis.  (Review of systems as stated in this history (HPI) or in the review of systems. Otherwise all other 12 point ROS are negative) ` ` `   Problem List/Past Medical [name], MD; 04/21/2018 5:23 PM) RIGHT INGUINAL PAIN (R10.31) LOWER ABDOMINAL PAIN, UNSPECIFIED (R10.30) OSTEITIS PUBIS (M86.9) RUQ PAIN (R10.11) PARAESOPHAGEAL HIATAL HERNIA (K44.9) GASTROESOPHAGEAL REFLUX DISEASE, ESOPHAGITIS PRESENCE NOT SPECIFIED (K21.9)  Past Surgical History [name], MD; 04/21/2018 5:23 PM) Knee Surgery Right. Laparoscopic Inguinal Hernia Surgery Left. Shoulder Surgery Left. Vasectomy  Diagnostic Studies History [name], MD; 04/21/2018 5:23 PM) Colonoscopy 1-5 years ago  Allergies [name], MD; 04/21/2018 5:23 PM) Banana (Diagnostic) *DIAGNOSTIC PRODUCTS* Amoxicillin *PENICILLINS* Skelaxin *MUSCULOSKELETAL THERAPY AGENTS* Eggs Allergies Reconciled  Medication History ([name], CMA; 04/21/2018 4:36 PM) BuPROPion HCl ER (XL) (150MG  Tablet ER 24HR, Oral daily) Active. NexIUM (40MG  Capsule DR, Oral daily) Active. Lisinopril-Hydrochlorothiazide (10-12.5MG  Tablet, Oral)  Active. Pravastatin Sodium (40MG  Tablet, Oral) Active. Medications Reconciled  Social History [name], MD; 04/21/2018 5:23 PM)  Caffeine use Carbonated beverages. No alcohol use No drug use Tobacco use Never smoker.  Family History [name], MD; 04/21/2018 5:23 PM) Hypertension Father.  Other Problems [name], MD; 04/21/2018 5:23 PM) Anxiety Disorder Gastroesophageal Reflux Disease Inguinal Hernia Other disease, cancer, significant illness Unspecified Diagnosis    Vitals ([name] CMA; 04/21/2018 4:36 PM) 04/21/2018 4:35 PM Weight: 172 lb Height: 67in Body Surface Area: 1.9 m Body Mass Index: 26.94 kg/m  Temp.: 98.9F  Pulse: 83 (Regular)  P.OX: 99% (Room air) BP: 116/68 (Sitting, Left Arm, Standard)      Physical Exam [name] MD; 04/21/2018 5:23 PM)  General Mental Status-Alert. General Appearance-Not in acute distress, Not Sickly. Orientation-Oriented X3. Hydration-Well hydrated. Voice-Normal.  Integumentary Global Assessment Upon inspection and palpation of skin surfaces of the - Axillae: non-tender, no inflammation or ulceration, no drainage. and Distribution of scalp and body hair is normal. General Characteristics Temperature - normal warmth is noted.  Head and Neck Head-normocephalic, atraumatic with no lesions or palpable masses. Face Global Assessment - atraumatic, no absence of expression. Neck Global Assessment - no abnormal movements, no bruit auscultated on the right, no bruit auscultated on the left, no decreased range of motion, non-tender. Trachea-midline. Thyroid Gland Characteristics - non-tender.  Eye Eyeball - Left-Extraocular movements intact, No Nystagmus. Eyeball - Right-Extraocular movements intact, No Nystagmus. Cornea - Left-No Hazy. Cornea - Right-No Hazy. Sclera/Conjunctiva - Left-No scleral icterus, No Discharge. Sclera/Conjunctiva -  Right-No scleral icterus, No Discharge. Pupil - Left-Direct reaction to light normal. Pupil - Right-Direct reaction to light normal.  ENMT Ears Pinna - Left - no drainage observed, no generalized tenderness observed. Right - no drainage observed, no generalized tenderness observed. Nose and Sinuses External Inspection of the Nose - no destructive lesion observed. Inspection of the nares - Left - quiet respiration. Right - quiet respiration. Mouth and Throat Lips - Upper Lip - no fissures observed, no pallor noted. Lower Lip - no fissures observed, no pallor noted. Nasopharynx - no discharge present. Oral Cavity/Oropharynx - Tongue - no dryness observed. Oral Mucosa - no cyanosis observed. Hypopharynx - no evidence of airway distress observed. Note: Some mild hoarseness of his voice.  Chest and Lung Exam Inspection Movements - Normal and Symmetrical. Accessory muscles - No use of accessory muscles in breathing. Palpation Palpation of the chest reveals - Non-tender. Auscultation Breath sounds - Normal and Clear.  Cardiovascular Auscultation Rhythm - Regular. Murmurs & Other Heart Sounds - Auscultation of the heart reveals - No Murmurs and No Systolic Clicks.  Abdomen Inspection Inspection of the abdomen reveals - No Visible peristalsis and No Abnormal pulsations. Umbilicus - No Bleeding, No Urine drainage. Palpation/Percussion Palpation and Percussion of the abdomen reveal - Soft, Non Tender, No Rebound tenderness, No Rigidity (guarding) and No Cutaneous hyperesthesia. Note: Abdomen soft. Mild epigastric discomfort. Mild left upper quadrant discomfort. The rest of the abdomen is Nontender. Not distended. No umbilical or incisional hernias. No guarding.  Male Genitourinary Sexual Maturity Tanner 5 - Adult hair pattern and Adult penile size and shape.  Peripheral Vascular Upper Extremity Inspection - Left - No Cyanotic nailbeds, Not Ischemic. Right - No Cyanotic  nailbeds, Not Ischemic.  Neurologic Neurologic evaluation reveals -normal attention span and ability to concentrate, able to name objects and repeat phrases. Appropriate fund of knowledge , normal sensation and normal coordination. Mental Status Affect - not angry, not paranoid. Cranial Nerves-Normal Bilaterally. Gait-Normal.  Neuropsychiatric Mental status exam performed with findings of-able to articulate well  with normal speech/language, rate, volume and coherence, thought content normal with ability to perform basic computations and apply abstract reasoning and no evidence of hallucinations, delusions, obsessions or homicidal/suicidal ideation.  Musculoskeletal Global Assessment Spine, Ribs and Pelvis - no instability, subluxation or laxity. Right Upper Extremity - no instability, subluxation or laxity.  Lymphatic Head & Neck  General Head & Neck Lymphatics: Bilateral - Description - No Localized lymphadenopathy. Axillary  General Axillary Region: Bilateral - Description - No Localized lymphadenopathy. Femoral & Inguinal  Generalized Femoral & Inguinal Lymphatics: Left - Description - No Localized lymphadenopathy. Right - Description - No Localized lymphadenopathy.    Assessment & Plan [name] MD; 04/21/2018 5:21 PM)  PARAESOPHAGEAL HIATAL HERNIA (K44.9) Impression: Rather classic story of worsening heartburn and reflux refractory to maximal medical therapy now with hiatal hernia.  Persistent symptoms and reflux by impedance despite being on proton pump inhibitors. Worsening symptoms of hoarseness and reflux over the year. He isn't ready to reconsider surgery. His wife came in as well. Discussed with her at length. Concerns about gas bloat and other issues discussed at length. He is exhausted nonsurgical options. He is interested in proceeding.  Normal manometry. Normal gastric emptying study. Hopefully less likely to have severe dysphagia or bloating issues as  a result  Negative ultrasound and HIDA scan for gallbladder etiology.  I think he would benefit from fundoplication and hiatal hernia repair. Given his relatively young age & in moderate activity level, I would probably do a absorbable mesh reinforcement of the repair to minimize recurrence. Discussed the surgical technique and dietary needs at length. He is interested in proceeding. 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 I cautioned against him return to work too early. Told him most likely 3 weeks before RTW moderate activity, at 6 weeks = unrestricted.  Current Plans You are being scheduled for surgery- Our schedulers will call you.  You should hear from our office's scheduling department within 5 working days about the location, date, and time of surgery. We try to make accommodations for patient's preferences in scheduling surgery, but sometimes the OR schedule or the surgeon's schedule prevents [place] from making those accommodations.  If you have not heard from our office [phone]) in 5 working days, call the office and ask for your surgeon's nurse.  If you have other questions about your diagnosis, plan, or surgery, call the office and ask for your surgeon's nurse.  The anatomy & physiology of the foregut and anti-reflux mechanism was discussed. The pathophysiology of hiatal herniation and GERD was discussed. Natural history risks without surgery was discussed. The patient's symptoms are not adequately controlled by medicines and other non-operative treatments. I feel the risks of no intervention will lead to serious problems that outweigh the operative risks; therefore, I recommended surgery to reduce the hiatal hernia out of the chest and fundoplication to rebuild the anti-reflux valve and control reflux better. Need for a thorough workup to rule out the differential diagnosis and plan treatment was explained. I explained laparoscopic techniques with possible  need for an open approach.  Risks such as bleeding, infection, abscess, leak, need for further treatment, heart attack, death, and other risks were discussed. I noted a good likelihood this will help address the problem. Goals of post-operative recovery were discussed as well. Possibility that this will not correct all symptoms was explained. Post-operative dysphagia, need for short-term liquid & pureed diet, inability to vomit, possibility of reherniation, possible need for medicines to help  control symptoms in addition to surgery were discussed. We will work to minimize complications. Educational handouts further explaining the pathology, treatment options, and dysphagia diet was given as well. Questions were answered. The patient expresses understanding & wishes to proceed with surgery.  Pt Education - [facility] Esophageal Surgery Diet HCI (Eragon Hammond): discussed with patient and provided information. Pt Education - [facility] Laparoscopic Surgery HCI  GASTROESOPHAGEAL REFLUX DISEASE, ESOPHAGITIS PRESENCE NOT SPECIFIED (K21.9)  Current Plans Pt Education - Pamphlet Given - Gastroesophagel Reflux Disease: discussed with patient and provided information.  Ardeth SportsmanSteven C. Nettye Flegal, MD, FACS, MASCRS Gastrointestinal and Minimally Invasive Surgery    [address] [place] [id number] [phone] Main / Paging [phone] Fax

## 2018-05-07 DIAGNOSIS — H1045 Other chronic allergic conjunctivitis: Secondary | ICD-10-CM | POA: Diagnosis not present

## 2018-05-07 DIAGNOSIS — J301 Allergic rhinitis due to pollen: Secondary | ICD-10-CM | POA: Diagnosis not present

## 2018-05-07 DIAGNOSIS — J3089 Other allergic rhinitis: Secondary | ICD-10-CM | POA: Diagnosis not present

## 2018-05-07 DIAGNOSIS — J3081 Allergic rhinitis due to animal (cat) (dog) hair and dander: Secondary | ICD-10-CM | POA: Diagnosis not present

## 2018-05-15 DIAGNOSIS — J3081 Allergic rhinitis due to animal (cat) (dog) hair and dander: Secondary | ICD-10-CM | POA: Diagnosis not present

## 2018-05-15 DIAGNOSIS — J301 Allergic rhinitis due to pollen: Secondary | ICD-10-CM | POA: Diagnosis not present

## 2018-05-16 DIAGNOSIS — J3089 Other allergic rhinitis: Secondary | ICD-10-CM | POA: Diagnosis not present

## 2018-06-05 DIAGNOSIS — J3081 Allergic rhinitis due to animal (cat) (dog) hair and dander: Secondary | ICD-10-CM | POA: Diagnosis not present

## 2018-06-05 DIAGNOSIS — J301 Allergic rhinitis due to pollen: Secondary | ICD-10-CM | POA: Diagnosis not present

## 2018-06-05 DIAGNOSIS — J3089 Other allergic rhinitis: Secondary | ICD-10-CM | POA: Diagnosis not present

## 2018-06-11 DIAGNOSIS — J301 Allergic rhinitis due to pollen: Secondary | ICD-10-CM | POA: Diagnosis not present

## 2018-06-11 DIAGNOSIS — J3081 Allergic rhinitis due to animal (cat) (dog) hair and dander: Secondary | ICD-10-CM | POA: Diagnosis not present

## 2018-06-11 DIAGNOSIS — J3089 Other allergic rhinitis: Secondary | ICD-10-CM | POA: Diagnosis not present

## 2018-06-13 ENCOUNTER — Encounter (HOSPITAL_COMMUNITY): Payer: Self-pay

## 2018-06-13 NOTE — Patient Instructions (Addendum)
Your procedure is scheduled on: Friday, Jan. 31, 2020   Surgery Time:  7:30AM-10:30AM   [facility]  [facility]    Report to admitting at 5:30 AM   Call this number if you have problems the morning of surgery [phone]   Do not eat food or drink liquids :After Midnight.   Brush your teeth the morning of surgery.   Do NOT smoke after Midnight   Complete one Ensure drink the morning of surgery at 4:30AM the day of surgery.   Take these medicines the morning of surgery with A SIP OF WATER: Bupropion, Pantoprazole,                               You may not have any metal on your body including jewelry, and body piercings             Do not wear lotions, powders, perfumes/cologne, or deodorant                           Men may shave face and neck.   Do not bring valuables to the hospital. [facility] IS NOT             RESPONSIBLE   FOR VALUABLES.   Contacts, dentures or bridgework may not be worn into surgery.   Leave suitcase in the car. After surgery it may be brought to your room.    Special Instructions: Bring a copy of your healthcare power of attorney and living will documents         the day of surgery if you haven't scanned them in before.              Please read over the following fact sheets you were given:  [facility] - Preparing for [facility] Before surgery, you can play an important role.  Because skin is not sterile, your skin needs to be as free of germs as possible.  You can reduce the number of germs on your skin by washing with CHG (chlorahexidine gluconate) soap before surgery.  CHG is an antiseptic cleaner which kills germs and bonds with the skin to continue killing germs even after washing. Please DO NOT use if you have an allergy to CHG or antibacterial soaps.  If your skin becomes reddened/irritated stop using the CHG and inform your nurse when you arrive at Short Stay. Do not shave (including legs and underarms) for at least  48 hours prior to the first CHG shower.  You may shave your face/neck.  Please follow these instructions carefully:  1.  Shower with CHG Soap the night before surgery and the  morning of surgery.  2.  If you choose to wash your hair, wash your hair first as usual with your normal  shampoo.  3.  After you shampoo, rinse your hair and body thoroughly to remove the shampoo.                             4.  Use CHG as you would any other liquid soap.  You can apply chg directly to the skin and wash.  Gently with a scrungie or clean washcloth.  5.  Apply the CHG Soap to your body ONLY FROM THE NECK DOWN.   Do   not use on face/ open  Wound or open sores. Avoid contact with eyes, ears mouth and   genitals (private parts).                       Wash face,  Genitals (private parts) with your normal soap.             6.  Wash thoroughly, paying special attention to the area where your    surgery  will be performed.  7.  Thoroughly rinse your body with warm water from the neck down.  8.  DO NOT shower/wash with your normal soap after using and rinsing off the CHG Soap.                9.  Pat yourself dry with a clean towel.            10.  Wear clean pajamas.            11.  Place clean sheets on your bed the night of your first shower and do not  sleep with pets. Day of Surgery : Do not apply any lotions/deodorants the morning of surgery.  Please wear clean clothes to the hospital/surgery center.  FAILURE TO FOLLOW THESE INSTRUCTIONS MAY RESULT IN THE CANCELLATION OF YOUR SURGERY  PATIENT SIGNATURE_________________________________  NURSE SIGNATURE__________________________________  ________________________________________________________________________

## 2018-06-16 DIAGNOSIS — J3081 Allergic rhinitis due to animal (cat) (dog) hair and dander: Secondary | ICD-10-CM | POA: Diagnosis not present

## 2018-06-16 DIAGNOSIS — J301 Allergic rhinitis due to pollen: Secondary | ICD-10-CM | POA: Diagnosis not present

## 2018-06-17 ENCOUNTER — Other Ambulatory Visit: Payer: Self-pay

## 2018-06-17 ENCOUNTER — Encounter (HOSPITAL_COMMUNITY)
Admission: RE | Admit: 2018-06-17 | Discharge: 2018-06-17 | Disposition: A | Discharge status: Home / Self Care | Payer: BLUE CROSS/BLUE SHIELD | Source: Ambulatory Visit | Attending: Surgery | Admitting: Surgery

## 2018-06-17 ENCOUNTER — Encounter (HOSPITAL_COMMUNITY): Payer: Self-pay

## 2018-06-17 DIAGNOSIS — R12 Heartburn: Secondary | ICD-10-CM | POA: Insufficient documentation

## 2018-06-17 DIAGNOSIS — K219 Gastro-esophageal reflux disease without esophagitis: Secondary | ICD-10-CM | POA: Insufficient documentation

## 2018-06-17 DIAGNOSIS — K449 Diaphragmatic hernia without obstruction or gangrene: Secondary | ICD-10-CM | POA: Diagnosis not present

## 2018-06-17 DIAGNOSIS — Z01818 Encounter for other preprocedural examination: Secondary | ICD-10-CM | POA: Diagnosis not present

## 2018-06-17 HISTORY — DX: Other complications of anesthesia, initial encounter: T88.59XA

## 2018-06-17 HISTORY — DX: Carcinoma in situ of skin of unspecified part of face: D04.30

## 2018-06-17 HISTORY — DX: Hyperlipidemia, unspecified: E78.5

## 2018-06-17 HISTORY — DX: Headache, unspecified: R51.9

## 2018-06-17 HISTORY — DX: Headache: R51

## 2018-06-17 HISTORY — DX: Adverse effect of unspecified anesthetic, initial encounter: T41.45XA

## 2018-06-17 HISTORY — DX: Depression, unspecified: F32.A

## 2018-06-17 HISTORY — DX: Personal history of other diseases of the digestive system: Z87.19

## 2018-06-17 HISTORY — DX: Major depressive disorder, single episode, unspecified: F32.9

## 2018-06-17 LAB — CBC
HCT: 40.2 % (ref 39.0–52.0)
Hemoglobin: 13.5 g/dL (ref 13.0–17.0)
MCH: 32.2 pg (ref 26.0–34.0)
MCHC: 33.6 g/dL (ref 30.0–36.0)
MCV: 95.9 fL (ref 80.0–100.0)
Platelets: 243 10*3/uL (ref 150–400)
RBC: 4.19 MIL/uL — ABNORMAL LOW (ref 4.22–5.81)
RDW: 12.1 % (ref 11.5–15.5)
WBC: 6.4 10*3/uL (ref 4.0–10.5)
nRBC: 0 % (ref 0.0–0.2)

## 2018-06-17 LAB — BASIC METABOLIC PANEL
ANION GAP: 9 (ref 5–15)
BUN: 9 mg/dL (ref 6–20)
CHLORIDE: 101 mmol/L (ref 98–111)
CO2: 28 mmol/L (ref 22–32)
Calcium: 8.7 mg/dL — ABNORMAL LOW (ref 8.9–10.3)
Creatinine, Ser: 1.24 mg/dL (ref 0.61–1.24)
GFR calc Af Amer: >60 mL/min (ref >60)
GFR calc non Af Amer: >60 mL/min (ref >60)
Glucose, Bld: 96 mg/dL (ref 70–99)
Potassium: 3.5 mmol/L (ref 3.5–5.1)
Sodium: 138 mmol/L (ref 135–145)

## 2018-06-17 NOTE — Progress Notes (Signed)
CBC and BMP results 06/17/2018 sent to Dr. Michaell Cowing via epic.

## 2018-06-19 MED ORDER — GENTAMICIN SULFATE 40 MG/ML IJ SOLN
380.0000 mg | INTRAVENOUS | Status: AC
  Administered 2018-06-20: 380 mg via INTRAVENOUS
  Filled 2018-06-19: qty 9.5

## 2018-06-19 MED ORDER — CLINDAMYCIN PHOSPHATE 900 MG/50ML IV SOLN: 900.0000 mg | INTRAVENOUS | Status: DC

## 2018-06-19 MED ORDER — BUPIVACAINE LIPOSOME 1.3 % IJ SUSP
20.0000 mL | INTRAMUSCULAR | Status: DC
  Filled 2018-06-19: qty 20

## 2018-06-19 NOTE — Anesthesia Preprocedure Evaluation (Addendum)
Anesthesia Evaluation  Patient identified by MRN, date of birth, ID band Patient awake    Reviewed: Allergy & Precautions, NPO status , Patient's Chart, lab work & pertinent test results  History of Anesthesia Complications Negative for: history of anesthetic complications  Airway Mallampati: II  TM Distance: >3 FB Neck ROM: Full    Dental  (+) Dental Advisory Given   Pulmonary neg pulmonary ROS,    breath sounds clear to auscultation       Cardiovascular hypertension, Pt. on medications (-) anginanegative cardio ROS   Rhythm:Regular Rate:Normal     Neuro/Psych negative neurological ROS     GI/Hepatic Neg liver ROS, hiatal hernia, GERD  Poorly Controlled,  Endo/Other  negative endocrine ROS  Renal/GU negative Renal ROS     Musculoskeletal   Abdominal   Peds  Hematology negative hematology ROS (+)   Anesthesia Other Findings   Reproductive/Obstetrics                            Anesthesia Physical Anesthesia Plan  ASA: II  Anesthesia Plan: General   Post-op Pain Management:    Induction: Intravenous, Rapid sequence and Cricoid pressure planned  PONV Risk Score and Plan: 3 and Ondansetron, Dexamethasone and Scopolamine patch - Pre-op  Airway Management Planned: Oral ETT  Additional Equipment:   Intra-op Plan:   Post-operative Plan: Extubation in OR  Informed Consent: I have reviewed the patients History and Physical, chart, labs and discussed the procedure including the risks, benefits and alternatives for the proposed anesthesia with the patient or authorized representative who has indicated his/her understanding and acceptance.     Dental advisory given  Plan Discussed with: CRNA and Surgeon  Anesthesia Plan Comments: (Plan routine monitors, GETA)       Anesthesia Quick Evaluation

## 2018-06-20 ENCOUNTER — Ambulatory Visit (HOSPITAL_COMMUNITY): Payer: BLUE CROSS/BLUE SHIELD | Admitting: Anesthesiology

## 2018-06-20 ENCOUNTER — Encounter (HOSPITAL_COMMUNITY)
Admission: RE | Disposition: A | Discharge status: Home / Self Care | Payer: Self-pay | Source: Home / Self Care | Attending: Surgery

## 2018-06-20 ENCOUNTER — Other Ambulatory Visit: Payer: Self-pay

## 2018-06-20 ENCOUNTER — Ambulatory Visit (HOSPITAL_COMMUNITY): Payer: BLUE CROSS/BLUE SHIELD | Admitting: Physician Assistant

## 2018-06-20 ENCOUNTER — Encounter (HOSPITAL_COMMUNITY): Payer: Self-pay | Admitting: *Deleted

## 2018-06-20 ENCOUNTER — Observation Stay (HOSPITAL_COMMUNITY)
Admission: RE | Admit: 2018-06-20 | Discharge: 2018-06-21 | Disposition: A | Discharge status: Home / Self Care | Payer: BLUE CROSS/BLUE SHIELD | Attending: Surgery | Admitting: Surgery

## 2018-06-20 DIAGNOSIS — Z888 Allergy status to other drugs, medicaments and biological substances status: Secondary | ICD-10-CM | POA: Diagnosis not present

## 2018-06-20 DIAGNOSIS — K219 Gastro-esophageal reflux disease without esophagitis: Secondary | ICD-10-CM | POA: Diagnosis not present

## 2018-06-20 DIAGNOSIS — K449 Diaphragmatic hernia without obstruction or gangrene: Secondary | ICD-10-CM | POA: Diagnosis not present

## 2018-06-20 DIAGNOSIS — E785 Hyperlipidemia, unspecified: Secondary | ICD-10-CM | POA: Insufficient documentation

## 2018-06-20 DIAGNOSIS — Z880 Allergy status to penicillin: Secondary | ICD-10-CM | POA: Diagnosis not present

## 2018-06-20 DIAGNOSIS — Z79899 Other long term (current) drug therapy: Secondary | ICD-10-CM | POA: Insufficient documentation

## 2018-06-20 DIAGNOSIS — F329 Major depressive disorder, single episode, unspecified: Secondary | ICD-10-CM | POA: Insufficient documentation

## 2018-06-20 DIAGNOSIS — I10 Essential (primary) hypertension: Secondary | ICD-10-CM | POA: Diagnosis not present

## 2018-06-20 DIAGNOSIS — R490 Dysphonia: Secondary | ICD-10-CM | POA: Diagnosis present

## 2018-06-20 DIAGNOSIS — K2100 Gastro-esophageal reflux disease with esophagitis, without bleeding: Secondary | ICD-10-CM

## 2018-06-20 DIAGNOSIS — Z9889 Other specified postprocedural states: Secondary | ICD-10-CM

## 2018-06-20 DIAGNOSIS — F411 Generalized anxiety disorder: Secondary | ICD-10-CM | POA: Diagnosis present

## 2018-06-20 HISTORY — DX: Other specified postprocedural states: Z98.890

## 2018-06-20 HISTORY — DX: Gastro-esophageal reflux disease with esophagitis, without bleeding: K21.00

## 2018-06-20 HISTORY — DX: Gastro-esophageal reflux disease with esophagitis: K21.0

## 2018-06-20 HISTORY — DX: Diaphragmatic hernia without obstruction or gangrene: K44.9

## 2018-06-20 HISTORY — PX: XI ROBOTIC ASSISTED PARASTOMAL HERNIA REPAIR: SHX6540

## 2018-06-20 SURGERY — REPAIR, HERNIA, PARASTOMAL, ROBOT-ASSISTED
Anesthesia: General | Site: Abdomen

## 2018-06-20 MED ORDER — PROMETHAZINE HCL 25 MG RE SUPP: 25.0000 mg | Freq: Four times a day (QID) | RECTAL | 12 refills | Status: DC | PRN

## 2018-06-20 MED ORDER — PROPOFOL 10 MG/ML IV BOLUS
INTRAVENOUS | Status: AC
  Filled 2018-06-20: qty 20

## 2018-06-20 MED ORDER — LIDOCAINE 2% (20 MG/ML) 5 ML SYRINGE
INTRAMUSCULAR | Status: DC | PRN
  Administered 2018-06-20: 1.5 mg/kg/h via INTRAVENOUS

## 2018-06-20 MED ORDER — ACETAMINOPHEN 500 MG PO TABS
1000.0000 mg | ORAL_TABLET | Freq: Three times a day (TID) | ORAL | Status: DC
  Administered 2018-06-20 – 2018-06-21 (×3): 1000 mg via ORAL
  Filled 2018-06-20 (×3): qty 2

## 2018-06-20 MED ORDER — GABAPENTIN 300 MG PO CAPS
300.0000 mg | ORAL_CAPSULE | Freq: Two times a day (BID) | ORAL | Status: DC
  Administered 2018-06-20 – 2018-06-21 (×3): 300 mg via ORAL
  Filled 2018-06-20 (×2): qty 1

## 2018-06-20 MED ORDER — LIP MEDEX EX OINT
1.0000 "application " | TOPICAL_OINTMENT | Freq: Two times a day (BID) | CUTANEOUS | Status: DC
  Administered 2018-06-20 – 2018-06-21 (×2): 1 via TOPICAL
  Filled 2018-06-20: qty 7

## 2018-06-20 MED ORDER — SUCCINYLCHOLINE CHLORIDE 200 MG/10ML IV SOSY
PREFILLED_SYRINGE | INTRAVENOUS | Status: AC
  Filled 2018-06-20: qty 10

## 2018-06-20 MED ORDER — ONDANSETRON HCL 4 MG/2ML IJ SOLN
INTRAMUSCULAR | Status: DC | PRN
  Administered 2018-06-20: 4 mg via INTRAVENOUS

## 2018-06-20 MED ORDER — OXYCODONE HCL 5 MG PO TABS
ORAL_TABLET | ORAL | Status: AC
  Filled 2018-06-20: qty 2

## 2018-06-20 MED ORDER — SCOPOLAMINE 1 MG/3DAYS TD PT72
1.0000 | MEDICATED_PATCH | Freq: Once | TRANSDERMAL | Status: DC
  Administered 2018-06-20: 1.5 mg via TRANSDERMAL
  Filled 2018-06-20: qty 1

## 2018-06-20 MED ORDER — GABAPENTIN 300 MG PO CAPS
300.0000 mg | ORAL_CAPSULE | ORAL | Status: AC
  Administered 2018-06-20: 300 mg via ORAL
  Filled 2018-06-20: qty 1

## 2018-06-20 MED ORDER — LISINOPRIL 10 MG PO TABS
10.0000 mg | ORAL_TABLET | Freq: Every day | ORAL | Status: DC
  Administered 2018-06-20 – 2018-06-21 (×2): 10 mg via ORAL
  Filled 2018-06-20 (×3): qty 1

## 2018-06-20 MED ORDER — ONDANSETRON 4 MG PO TBDP: 4.0000 mg | ORAL_TABLET | Freq: Four times a day (QID) | ORAL | Status: DC | PRN

## 2018-06-20 MED ORDER — BUPIVACAINE LIPOSOME 1.3 % IJ SUSP
INTRAMUSCULAR | Status: DC | PRN
  Administered 2018-06-20: 20 mL

## 2018-06-20 MED ORDER — FENTANYL CITRATE (PF) 250 MCG/5ML IJ SOLN
INTRAMUSCULAR | Status: DC | PRN
  Administered 2018-06-20: 150 ug via INTRAVENOUS
  Administered 2018-06-20 (×2): 50 ug via INTRAVENOUS
  Administered 2018-06-20: 100 ug via INTRAVENOUS

## 2018-06-20 MED ORDER — EPINEPHRINE 0.3 MG/0.3ML IJ SOAJ: 0.3000 mg | Freq: Once | INTRAMUSCULAR | Status: DC | PRN

## 2018-06-20 MED ORDER — DIPHENHYDRAMINE HCL 50 MG/ML IJ SOLN: 12.5000 mg | Freq: Four times a day (QID) | INTRAMUSCULAR | Status: DC | PRN

## 2018-06-20 MED ORDER — LIDOCAINE 2% (20 MG/ML) 5 ML SYRINGE
INTRAMUSCULAR | Status: AC
  Filled 2018-06-20: qty 5

## 2018-06-20 MED ORDER — SIMETHICONE 40 MG/0.6ML PO SUSP
40.0000 mg | Freq: Four times a day (QID) | ORAL | Status: DC
  Administered 2018-06-20 – 2018-06-21 (×3): 40 mg via ORAL
  Filled 2018-06-20 (×5): qty 0.6

## 2018-06-20 MED ORDER — BUPIVACAINE HCL (PF) 0.25 % IJ SOLN
INTRAMUSCULAR | Status: AC
  Filled 2018-06-20: qty 60

## 2018-06-20 MED ORDER — ONDANSETRON HCL 4 MG/2ML IJ SOLN
INTRAMUSCULAR | Status: AC
  Filled 2018-06-20: qty 2

## 2018-06-20 MED ORDER — BUPROPION HCL ER (XL) 150 MG PO TB24
150.0000 mg | ORAL_TABLET | Freq: Every day | ORAL | Status: DC
  Administered 2018-06-21: 150 mg via ORAL
  Filled 2018-06-20 (×2): qty 1

## 2018-06-20 MED ORDER — METOPROLOL TARTRATE 5 MG/5ML IV SOLN: 5.0000 mg | Freq: Four times a day (QID) | INTRAVENOUS | Status: DC | PRN

## 2018-06-20 MED ORDER — CELECOXIB 200 MG PO CAPS
200.0000 mg | ORAL_CAPSULE | ORAL | Status: AC
  Administered 2018-06-20: 200 mg via ORAL
  Filled 2018-06-20: qty 1

## 2018-06-20 MED ORDER — TRAMADOL HCL 50 MG PO TABS: 50.0000 mg | ORAL_TABLET | Freq: Four times a day (QID) | ORAL | 0 refills | Status: DC | PRN

## 2018-06-20 MED ORDER — ROCURONIUM BROMIDE 100 MG/10ML IV SOLN
INTRAVENOUS | Status: DC | PRN
  Administered 2018-06-20: 50 mg via INTRAVENOUS

## 2018-06-20 MED ORDER — PHENYLEPHRINE 40 MCG/ML (10ML) SYRINGE FOR IV PUSH (FOR BLOOD PRESSURE SUPPORT)
PREFILLED_SYRINGE | INTRAVENOUS | Status: DC | PRN
  Administered 2018-06-20: 160 ug via INTRAVENOUS
  Administered 2018-06-20 (×3): 80 ug via INTRAVENOUS

## 2018-06-20 MED ORDER — LACTATED RINGERS IR SOLN
Status: DC | PRN
  Administered 2018-06-20: 1000 mL

## 2018-06-20 MED ORDER — SUCCINYLCHOLINE CHLORIDE 200 MG/10ML IV SOSY
PREFILLED_SYRINGE | INTRAVENOUS | Status: DC | PRN
  Administered 2018-06-20: 120 mg via INTRAVENOUS

## 2018-06-20 MED ORDER — CLINDAMYCIN PHOSPHATE 900 MG/50ML IV SOLN
INTRAVENOUS | Status: AC
  Filled 2018-06-20: qty 50

## 2018-06-20 MED ORDER — LACTATED RINGERS IV SOLN
INTRAVENOUS | Status: DC
  Administered 2018-06-20 (×2): via INTRAVENOUS

## 2018-06-20 MED ORDER — LACTATED RINGERS IV SOLN: 1000.0000 mL | Freq: Three times a day (TID) | INTRAVENOUS | Status: DC | PRN

## 2018-06-20 MED ORDER — SODIUM CHLORIDE 0.9 % IV SOLN
INTRAVENOUS | Status: DC
  Administered 2018-06-20: 18:00:00 via INTRAVENOUS

## 2018-06-20 MED ORDER — PRAVASTATIN SODIUM 20 MG PO TABS
40.0000 mg | ORAL_TABLET | Freq: Every evening | ORAL | Status: DC
  Administered 2018-06-20: 40 mg via ORAL
  Filled 2018-06-20: qty 1
  Filled 2018-06-20: qty 2

## 2018-06-20 MED ORDER — MAGIC MOUTHWASH
15.0000 mL | Freq: Four times a day (QID) | ORAL | Status: DC | PRN
  Filled 2018-06-20: qty 15

## 2018-06-20 MED ORDER — DEXAMETHASONE SODIUM PHOSPHATE 10 MG/ML IJ SOLN
INTRAMUSCULAR | Status: DC | PRN
  Administered 2018-06-20: 10 mg via INTRAVENOUS

## 2018-06-20 MED ORDER — OXYCODONE HCL 5 MG PO TABS
5.0000 mg | ORAL_TABLET | ORAL | Status: DC | PRN
  Administered 2018-06-20 (×2): 10 mg via ORAL
  Administered 2018-06-21: 5 mg via ORAL
  Filled 2018-06-20: qty 2
  Filled 2018-06-20: qty 1

## 2018-06-20 MED ORDER — DEXAMETHASONE SODIUM PHOSPHATE 10 MG/ML IJ SOLN
INTRAMUSCULAR | Status: AC
  Filled 2018-06-20: qty 1

## 2018-06-20 MED ORDER — HYDROMORPHONE HCL 1 MG/ML IJ SOLN
INTRAMUSCULAR | Status: AC
  Filled 2018-06-20: qty 2

## 2018-06-20 MED ORDER — CLINDAMYCIN PHOSPHATE 900 MG/50ML IV SOLN
INTRAVENOUS | Status: DC | PRN
  Administered 2018-06-20: 900 mg via INTRAVENOUS

## 2018-06-20 MED ORDER — 0.9 % SODIUM CHLORIDE (POUR BTL) OPTIME
TOPICAL | Status: DC | PRN
  Administered 2018-06-20: 2000 mL

## 2018-06-20 MED ORDER — MEPERIDINE HCL 50 MG/ML IJ SOLN: 6.2500 mg | INTRAMUSCULAR | Status: DC | PRN

## 2018-06-20 MED ORDER — LIDOCAINE 2% (20 MG/ML) 5 ML SYRINGE
INTRAMUSCULAR | Status: DC | PRN
  Administered 2018-06-20: 60 mg via INTRAVENOUS

## 2018-06-20 MED ORDER — POLYETHYLENE GLYCOL 3350 17 G PO PACK: 17.0000 g | PACK | Freq: Every day | ORAL | Status: DC | PRN

## 2018-06-20 MED ORDER — MIDAZOLAM HCL 2 MG/2ML IJ SOLN
INTRAMUSCULAR | Status: AC
  Filled 2018-06-20: qty 2

## 2018-06-20 MED ORDER — HYDROMORPHONE HCL 1 MG/ML IJ SOLN
0.2500 mg | INTRAMUSCULAR | Status: DC | PRN
  Administered 2018-06-20 (×2): 0.5 mg via INTRAVENOUS

## 2018-06-20 MED ORDER — PROPOFOL 10 MG/ML IV BOLUS
INTRAVENOUS | Status: DC | PRN
  Administered 2018-06-20: 200 mg via INTRAVENOUS

## 2018-06-20 MED ORDER — HYDROCHLOROTHIAZIDE 12.5 MG PO CAPS
12.5000 mg | ORAL_CAPSULE | Freq: Every day | ORAL | Status: DC
  Administered 2018-06-20 – 2018-06-21 (×2): 12.5 mg via ORAL
  Filled 2018-06-20 (×3): qty 1

## 2018-06-20 MED ORDER — ONDANSETRON HCL 4 MG/2ML IJ SOLN: 4.0000 mg | Freq: Four times a day (QID) | INTRAMUSCULAR | Status: DC | PRN

## 2018-06-20 MED ORDER — PROMETHAZINE HCL 25 MG/ML IJ SOLN: 6.2500 mg | INTRAMUSCULAR | Status: DC | PRN

## 2018-06-20 MED ORDER — BUPIVACAINE HCL (PF) 0.25 % IJ SOLN
INTRAMUSCULAR | Status: DC | PRN
  Administered 2018-06-20: 50 mL

## 2018-06-20 MED ORDER — SUGAMMADEX SODIUM 200 MG/2ML IV SOLN
INTRAVENOUS | Status: DC | PRN
  Administered 2018-06-20: 200 mg via INTRAVENOUS

## 2018-06-20 MED ORDER — ONDANSETRON HCL 4 MG PO TABS: 4.0000 mg | ORAL_TABLET | Freq: Three times a day (TID) | ORAL | 12 refills | Status: DC | PRN

## 2018-06-20 MED ORDER — PHENYLEPHRINE HCL 10 MG/ML IJ SOLN
INTRAMUSCULAR | Status: AC
  Filled 2018-06-20: qty 1

## 2018-06-20 MED ORDER — MIDAZOLAM HCL 2 MG/2ML IJ SOLN: 0.5000 mg | Freq: Once | INTRAMUSCULAR | Status: DC | PRN

## 2018-06-20 MED ORDER — PANTOPRAZOLE SODIUM 40 MG PO TBEC
40.0000 mg | DELAYED_RELEASE_TABLET | Freq: Two times a day (BID) | ORAL | Status: DC
  Administered 2018-06-20 – 2018-06-21 (×3): 40 mg via ORAL
  Filled 2018-06-20 (×3): qty 1

## 2018-06-20 MED ORDER — FENTANYL CITRATE (PF) 100 MCG/2ML IJ SOLN
INTRAMUSCULAR | Status: AC
  Filled 2018-06-20: qty 2

## 2018-06-20 MED ORDER — ROCURONIUM BROMIDE 100 MG/10ML IV SOLN
INTRAVENOUS | Status: AC
  Filled 2018-06-20: qty 1

## 2018-06-20 MED ORDER — PROCHLORPERAZINE EDISYLATE 10 MG/2ML IJ SOLN: 5.0000 mg | Freq: Four times a day (QID) | INTRAMUSCULAR | Status: DC | PRN

## 2018-06-20 MED ORDER — PROCHLORPERAZINE MALEATE 10 MG PO TABS: 10.0000 mg | ORAL_TABLET | Freq: Four times a day (QID) | ORAL | Status: DC | PRN

## 2018-06-20 MED ORDER — KETAMINE HCL 10 MG/ML IJ SOLN
INTRAMUSCULAR | Status: AC
  Filled 2018-06-20: qty 1

## 2018-06-20 MED ORDER — FENTANYL CITRATE (PF) 250 MCG/5ML IJ SOLN
INTRAMUSCULAR | Status: AC
  Filled 2018-06-20: qty 5

## 2018-06-20 MED ORDER — HYDROMORPHONE HCL 1 MG/ML IJ SOLN: 0.5000 mg | INTRAMUSCULAR | Status: DC | PRN

## 2018-06-20 MED ORDER — PHENYLEPHRINE 40 MCG/ML (10ML) SYRINGE FOR IV PUSH (FOR BLOOD PRESSURE SUPPORT)
PREFILLED_SYRINGE | INTRAVENOUS | Status: AC
  Filled 2018-06-20: qty 10

## 2018-06-20 MED ORDER — BISACODYL 10 MG RE SUPP: 10.0000 mg | Freq: Every day | RECTAL | Status: DC | PRN

## 2018-06-20 MED ORDER — SODIUM CHLORIDE 0.9 % IV SOLN
INTRAVENOUS | Status: DC | PRN
  Administered 2018-06-20: 50 ug/min via INTRAVENOUS

## 2018-06-20 MED ORDER — GABAPENTIN 300 MG PO CAPS
ORAL_CAPSULE | ORAL | Status: AC
  Administered 2018-06-20: 300 mg
  Filled 2018-06-20: qty 1

## 2018-06-20 MED ORDER — ENOXAPARIN SODIUM 40 MG/0.4ML ~~LOC~~ SOLN
40.0000 mg | SUBCUTANEOUS | Status: DC
  Administered 2018-06-21: 40 mg via SUBCUTANEOUS
  Filled 2018-06-20 (×2): qty 0.4

## 2018-06-20 MED ORDER — LISINOPRIL-HYDROCHLOROTHIAZIDE 10-12.5 MG PO TABS: 1.0000 | ORAL_TABLET | Freq: Every day | ORAL | Status: DC

## 2018-06-20 MED ORDER — ACETAMINOPHEN 500 MG PO TABS
1000.0000 mg | ORAL_TABLET | ORAL | Status: AC
  Administered 2018-06-20: 1000 mg via ORAL
  Filled 2018-06-20: qty 2

## 2018-06-20 MED ORDER — HYDRALAZINE HCL 20 MG/ML IJ SOLN: 5.0000 mg | INTRAMUSCULAR | Status: DC | PRN

## 2018-06-20 MED ORDER — DIPHENHYDRAMINE HCL 12.5 MG/5ML PO ELIX
12.5000 mg | ORAL_SOLUTION | Freq: Four times a day (QID) | ORAL | Status: DC | PRN
  Filled 2018-06-20: qty 5

## 2018-06-20 MED ORDER — SIMETHICONE 80 MG PO CHEW
40.0000 mg | CHEWABLE_TABLET | Freq: Four times a day (QID) | ORAL | Status: DC | PRN
  Administered 2018-06-20 – 2018-06-21 (×2): 40 mg via ORAL
  Filled 2018-06-20 (×3): qty 1

## 2018-06-20 MED ORDER — KETAMINE HCL 10 MG/ML IJ SOLN
INTRAMUSCULAR | Status: DC | PRN
  Administered 2018-06-20: 40 mg via INTRAVENOUS

## 2018-06-20 MED ORDER — ETOMIDATE 2 MG/ML IV SOLN
INTRAVENOUS | Status: AC
  Filled 2018-06-20: qty 10

## 2018-06-20 MED ORDER — METHOCARBAMOL 500 MG PO TABS
750.0000 mg | ORAL_TABLET | Freq: Four times a day (QID) | ORAL | Status: DC | PRN
  Administered 2018-06-20: 750 mg via ORAL
  Filled 2018-06-20: qty 1.5

## 2018-06-20 SURGICAL SUPPLY — 66 items
APPLICATOR COTTON TIP 6 STRL (MISCELLANEOUS) ×1 IMPLANT
APPLICATOR COTTON TIP 6IN STRL (MISCELLANEOUS) ×2
APPLIER CLIP 5 13 M/L LIGAMAX5 (MISCELLANEOUS)
APPLIER CLIP ROT 10 11.4 M/L (STAPLE)
BLADE SURG SZ11 CARB STEEL (BLADE) ×2 IMPLANT
CHLORAPREP W/TINT 26ML (MISCELLANEOUS) ×2 IMPLANT
CLIP APPLIE 5 13 M/L LIGAMAX5 (MISCELLANEOUS) IMPLANT
CLIP APPLIE ROT 10 11.4 M/L (STAPLE) IMPLANT
COVER SURGICAL LIGHT HANDLE (MISCELLANEOUS) ×2 IMPLANT
COVER TIP SHEARS 8 DVNC (MISCELLANEOUS) ×1 IMPLANT
COVER TIP SHEARS 8MM DA VINCI (MISCELLANEOUS) ×1
COVER WAND RF STERILE (DRAPES) ×2 IMPLANT
DECANTER SPIKE VIAL GLASS SM (MISCELLANEOUS) ×4 IMPLANT
DRAIN CHANNEL 19F RND (DRAIN) IMPLANT
DRAIN PENROSE 18X1/2 LTX STRL (DRAIN) IMPLANT
DRAPE ARM DVNC X/XI (DISPOSABLE) ×4 IMPLANT
DRAPE COLUMN DVNC XI (DISPOSABLE) ×1 IMPLANT
DRAPE DA VINCI XI ARM (DISPOSABLE) ×4
DRAPE DA VINCI XI COLUMN (DISPOSABLE) ×1
DRAPE WARM FLUID 44X44 (DRAPE) ×2 IMPLANT
DRSG TEGADERM 2-3/8X2-3/4 SM (GAUZE/BANDAGES/DRESSINGS) ×12 IMPLANT
ELECT REM PT RETURN 15FT ADLT (MISCELLANEOUS) ×2 IMPLANT
ENDOLOOP SUT PDS II  0 18 (SUTURE)
ENDOLOOP SUT PDS II 0 18 (SUTURE) IMPLANT
EVACUATOR DRAINAGE 10X20 100CC (DRAIN) IMPLANT
EVACUATOR SILICONE 100CC (DRAIN) IMPLANT
FELT TEFLON 4 X1 (Mesh General) ×2 IMPLANT
GAUZE SPONGE 2X2 8PLY STRL LF (GAUZE/BANDAGES/DRESSINGS) ×1 IMPLANT
GLOVE BIO SURGEON STRL SZ 6 (GLOVE) ×4 IMPLANT
GLOVE BIOGEL PI IND STRL 7.0 (GLOVE) ×2 IMPLANT
GLOVE BIOGEL PI INDICATOR 7.0 (GLOVE) ×2
GLOVE ECLIPSE 7.0 STRL STRAW (GLOVE) ×4 IMPLANT
GLOVE ECLIPSE 8.0 STRL XLNG CF (GLOVE) ×4 IMPLANT
GLOVE INDICATOR 6.5 STRL GRN (GLOVE) ×4 IMPLANT
GLOVE INDICATOR 8.0 STRL GRN (GLOVE) ×4 IMPLANT
GOWN STRL REUS W/TWL 2XL LVL3 (GOWN DISPOSABLE) ×4 IMPLANT
GOWN STRL REUS W/TWL XL LVL3 (GOWN DISPOSABLE) ×6 IMPLANT
IRRIG SUCT STRYKERFLOW 2 WTIP (MISCELLANEOUS) ×2
IRRIGATION SUCT STRKRFLW 2 WTP (MISCELLANEOUS) ×1 IMPLANT
KIT BASIN OR (CUSTOM PROCEDURE TRAY) ×2 IMPLANT
MESH PHASIX RESORB RECT 10X15 (Mesh General) ×2 IMPLANT
NEEDLE HYPO 22GX1.5 SAFETY (NEEDLE) ×2 IMPLANT
NEEDLE INSUFFLATION 14GA 120MM (NEEDLE) ×2 IMPLANT
PACK CARDIOVASCULAR III (CUSTOM PROCEDURE TRAY) ×2 IMPLANT
PAD POSITIONING PINK XL (MISCELLANEOUS) ×2 IMPLANT
SCISSORS LAP 5X45 EPIX DISP (ENDOMECHANICALS) ×2 IMPLANT
SEAL CANN UNIV 5-8 DVNC XI (MISCELLANEOUS) ×4 IMPLANT
SEAL XI 5MM-8MM UNIVERSAL (MISCELLANEOUS) ×4
SEALER VESSEL DA VINCI XI (MISCELLANEOUS) ×1
SEALER VESSEL EXT DVNC XI (MISCELLANEOUS) ×1 IMPLANT
SOLUTION ELECTROLUBE (MISCELLANEOUS) ×2 IMPLANT
SPONGE GAUZE 2X2 STER 10/PKG (GAUZE/BANDAGES/DRESSINGS) ×1
SPONGE LAP 18X18 RF (DISPOSABLE) ×2 IMPLANT
SURGILUBE 2OZ TUBE FLIPTOP (MISCELLANEOUS) ×2 IMPLANT
SUT ETHIBOND 0 36 GRN (SUTURE) ×6 IMPLANT
SUT ETHIBOND NAB CT1 #1 30IN (SUTURE) ×4 IMPLANT
SUT MNCRL AB 4-0 PS2 18 (SUTURE) ×2 IMPLANT
SUT PROLENE 2 0 SH DA (SUTURE) IMPLANT
SUT V-LOC BARB 180 2/0GR6 GS22 (SUTURE) ×2
SUTURE V-LC BRB 180 2/0GR6GS22 (SUTURE) ×1 IMPLANT
SYR 10ML LL (SYRINGE) ×2 IMPLANT
SYR 20CC LL (SYRINGE) ×2 IMPLANT
TOWEL OR 17X26 10 PK STRL BLUE (TOWEL DISPOSABLE) ×2 IMPLANT
TOWEL OR NON WOVEN STRL DISP B (DISPOSABLE) ×2 IMPLANT
TROCAR ADV FIXATION 5X100MM (TROCAR) ×2 IMPLANT
TUBING INSUFFLATION 10FT LAP (TUBING) ×2 IMPLANT

## 2018-06-20 NOTE — Transfer of Care (Signed)
Immediate Anesthesia Transfer of Care Note  Patient: Jeremy Diaz  Procedure(s) Performed: XI ROBOTIC REPAIR OF PARAESOPHAGEAL  HIATAL HERNIA WITH FUNDOPLICATION, WITH MESH (N/A Abdomen)  Patient Location: PACU  Anesthesia Type:General  Level of Consciousness: awake, alert  and confused  Airway & Oxygen Therapy: Patient Spontanous Breathing and Patient connected to face mask oxygen  Post-op Assessment: reviewed stable  Post vital signs: Reviewed and stable  Last Vitals:  Vitals Value Taken Time  BP 160/113 06/20/2018 10:33 AM  Temp    Pulse 86 06/20/2018 10:36 AM  Resp 14 06/20/2018 10:36 AM  SpO2 95 % 06/20/2018 10:36 AM  Vitals shown include unvalidated device data.  Last Pain:  Vitals:   06/20/18 0628  TempSrc:   PainSc: 0-No pain      Patients Stated Pain Goal: 3 (06/20/18 0254)  Complications: No apparent anesthesia complications

## 2018-06-20 NOTE — Anesthesia Postprocedure Evaluation (Signed)
Anesthesia Post Note  Patient: Jeremy Diaz  Procedure(s) Performed: XI ROBOTIC REPAIR OF PARAESOPHAGEAL  HIATAL HERNIA WITH FUNDOPLICATION, WITH MESH (N/A Abdomen)     Patient location during evaluation: PACU Anesthesia Type: General Level of consciousness: awake and alert, patient cooperative and oriented Pain management: pain level controlled Vital Signs Assessment: post-procedure vital signs reviewed and stable Respiratory status: spontaneous breathing, nonlabored ventilation and respiratory function stable Cardiovascular status: blood pressure returned to baseline and stable Postop Assessment: no apparent nausea or vomiting Anesthetic complications: no    Last Vitals:  Vitals:   06/20/18 1145 06/20/18 1300  BP: (!) 138/91 (!) 123/91  Pulse: 94 90  Resp: 16 15  Temp: 36.7 C 36.9 C  SpO2: 96% 97%    Last Pain:  Vitals:   06/20/18 1145  TempSrc:   PainSc: 3                  Neeti Knudtson,E. Abriella Filkins

## 2018-06-20 NOTE — Plan of Care (Signed)
Patient received from PACU via stretcher. Ambulated from stretcher in hall to bed in room. Will continue to monitor.

## 2018-06-20 NOTE — H&P (Signed)
Jeremy Diaz DOB: 1966-01-19 Married / Language: [place] / Race: White Male ` Patient Care Team: Maurice Small, MD as PCP - General (Family Medicine) Karie Soda, MD as Consulting Physician (General Surgery) Jeani Hawking, MD as Consulting Physician (Gastroenterology)   ` Patient sent for surgical consultation at the request of Dr. Jeani Hawking  Chief Complaint: Worsening heartburn and reflux with hiatal hernia  Patient returns with his wife. He notes he's had worsening heartburn and reflux issues. Wife concern is becoming more hoarse with sore throat. Has spitting up of bile at times. Remains on twice a day and antacid medications. Doing Tums as well. Still struggling. Was hesitant to consider surgery first but feels like his symptoms have worsened over the past year.  He is ready for surgery.   Prior Visit: The patient is a pleasant active male. Works in a warehouse with minor activity. He struggle with heartburn for many years. To take Tums for the past decade. Worsened. Started on proton pump inhibitor. He is tried many different times. Increased to twice a day.. EGD showed small hiatal hernia. Has been taking up to 6 PPI pills a day. Still also some Tums. Struggling with heartburn and reflux. Now every time he eats or drinks he feels like somethings coming up. Notice it when he bends over as well. He dances sleep on his side somewhat elevated. Not needing a lot of pillows. He moves his bowels every day. Burping and belching. Usually meat and occasionally greasy spicy foods. He did have some abdominal complaints going more to the right side. Saw Dr. Abbey Chatters about 2 years ago. Normal ultrasound. Normal HIDA scan. Recurrent symptoms last year. Normal gastric emptying study. Reassured. However symptoms of worsened. PPH and impedance suspicious for persistent reflux. Esophageal manometry normal. Because of maximal medical therapy with persistent  symptoms and unlikely other etiology, surgical consultation requested to see if he would benefit from fundoplication/hiatal hernia repair  No personal nor family history of GI/colon cancer, inflammatory bowel disease, irritable bowel syndrome, allergy such as Celiac Sprue, dietary/dairy problems, colitis, ulcers nor gastritis. No recent sick contacts/gastroenteritis. No travel outside the country. No changes in diet. No dysphagia to solids or liquids. No significant heartburn or reflux. No hematochezia, hematemesis, coffee ground emesis. No evidence of prior gastric/peptic ulceration. No exertional chest pain or shortness of breath. No asthma some hypertension usually controlled. Occasionally takes nonsteroidals but not on a daily basis.  (Review of systems as stated in this history (HPI) or in the review of systems. Otherwise all other 12 point ROS are negative) ` ` `   Problem List/Past Medical [name], MD; 04/21/2018 5:23 PM) RIGHT INGUINAL PAIN (R10.31) LOWER ABDOMINAL PAIN, UNSPECIFIED (R10.30) OSTEITIS PUBIS (M86.9) RUQ PAIN (R10.11) PARAESOPHAGEAL HIATAL HERNIA (K44.9) GASTROESOPHAGEAL REFLUX DISEASE, ESOPHAGITIS PRESENCE NOT SPECIFIED (K21.9)  Past Surgical History [name], MD; 04/21/2018 5:23 PM) Knee Surgery Right. Laparoscopic Inguinal Hernia Surgery Left. Shoulder Surgery Left. Vasectomy  Diagnostic Studies History [name], MD; 04/21/2018 5:23 PM) Colonoscopy 1-5 years ago  Allergies [name], MD; 04/21/2018 5:23 PM) Banana (Diagnostic) *DIAGNOSTIC PRODUCTS* Amoxicillin *PENICILLINS* Skelaxin *MUSCULOSKELETAL THERAPY AGENTS* Eggs Allergies Reconciled  Medication History ([name], CMA; 04/21/2018 4:36 PM) BuPROPion HCl ER (XL) (150MG  Tablet ER 24HR, Oral daily) Active. NexIUM (40MG  Capsule DR, Oral daily) Active. Lisinopril-Hydrochlorothiazide (10-12.5MG  Tablet, Oral) Active. Pravastatin  Sodium (40MG  Tablet, Oral) Active. Medications Reconciled  Social History [name], MD; 04/21/2018 5:23 PM) Caffeine use Carbonated beverages. No alcohol use  No drug use Tobacco use Never smoker.  Family History [name], MD; 04/21/2018 5:23 PM) Hypertension Father.  Other Problems [name], MD; 04/21/2018 5:23 PM) Anxiety Disorder Gastroesophageal Reflux Disease Inguinal Hernia Other disease, cancer, significant illness Unspecified Diagnosis    Vitals ([name] CMA; 04/21/2018 4:36 PM) 04/21/2018 4:35 PM Weight: 172 lb Height: 67in Body Surface Area: 1.9 m Body Mass Index: 26.94 kg/m  Temp.: 98.17F  Pulse: 83 (Regular)  P.OX: 99% (Room air) BP: 116/68 (Sitting, Left Arm, Standard)   BP 108/67   Pulse 74   Temp 98.2 F (36.8 C) (Oral)   Resp 18   Ht 5' 5.5" (1.664 m)   Wt 78 kg   SpO2 97%   BMI 28.19 kg/m     Physical Exam [name] MD; 04/21/2018 5:23 PM)  General Mental Status-Alert. General Appearance-Not in acute distress, Not Sickly. Orientation-Oriented X3. Hydration-Well hydrated. Voice-Normal.  Integumentary Global Assessment Upon inspection and palpation of skin surfaces of the - Axillae: non-tender, no inflammation or ulceration, no drainage. and Distribution of scalp and body hair is normal. General Characteristics Temperature - normal warmth is noted.  Head and Neck Head-normocephalic, atraumatic with no lesions or palpable masses. Face Global Assessment - atraumatic, no absence of expression. Neck Global Assessment - no abnormal movements, no bruit auscultated on the right, no bruit auscultated on the left, no decreased range of motion, non-tender. Trachea-midline. Thyroid Gland Characteristics - non-tender.  Eye Eyeball - Left-Extraocular movements intact, No Nystagmus. Eyeball - Right-Extraocular movements intact, No Nystagmus. Cornea -  Left-No Hazy. Cornea - Right-No Hazy. Sclera/Conjunctiva - Left-No scleral icterus, No Discharge. Sclera/Conjunctiva - Right-No scleral icterus, No Discharge. Pupil - Left-Direct reaction to light normal. Pupil - Right-Direct reaction to light normal.  ENMT Ears Pinna - Left - no drainage observed, no generalized tenderness observed. Right - no drainage observed, no generalized tenderness observed. Nose and Sinuses External Inspection of the Nose - no destructive lesion observed. Inspection of the nares - Left - quiet respiration. Right - quiet respiration. Mouth and Throat Lips - Upper Lip - no fissures observed, no pallor noted. Lower Lip - no fissures observed, no pallor noted. Nasopharynx - no discharge present. Oral Cavity/Oropharynx - Tongue - no dryness observed. Oral Mucosa - no cyanosis observed. Hypopharynx - no evidence of airway distress observed. Note: Some mild hoarseness of his voice.  Chest and Lung Exam Inspection Movements - Normal and Symmetrical. Accessory muscles - No use of accessory muscles in breathing. Palpation Palpation of the chest reveals - Non-tender. Auscultation Breath sounds - Normal and Clear.  Cardiovascular Auscultation Rhythm - Regular. Murmurs & Other Heart Sounds - Auscultation of the heart reveals - No Murmurs and No Systolic Clicks.  Abdomen Inspection Inspection of the abdomen reveals - No Visible peristalsis and No Abnormal pulsations. Umbilicus - No Bleeding, No Urine drainage. Palpation/Percussion Palpation and Percussion of the abdomen reveal - Soft, Non Tender, No Rebound tenderness, No Rigidity (guarding) and No Cutaneous hyperesthesia. Note: Abdomen soft. Mild epigastric discomfort. Mild left upper quadrant discomfort. The rest of the abdomen is Nontender. Not distended. No umbilical or incisional hernias. No guarding.  Male Genitourinary Sexual Maturity Tanner 5 - Adult hair pattern and Adult penile size and  shape.  Peripheral Vascular Upper Extremity Inspection - Left - No Cyanotic nailbeds, Not Ischemic. Right - No Cyanotic nailbeds, Not Ischemic.  Neurologic Neurologic evaluation reveals -normal attention span and ability to concentrate, able to name objects and repeat phrases. Appropriate  fund of knowledge , normal sensation and normal coordination. Mental Status Affect - not angry, not paranoid. Cranial Nerves-Normal Bilaterally. Gait-Normal.  Neuropsychiatric Mental status exam performed with findings of-able to articulate well with normal speech/language, rate, volume and coherence, thought content normal with ability to perform basic computations and apply abstract reasoning and no evidence of hallucinations, delusions, obsessions or homicidal/suicidal ideation.  Musculoskeletal Global Assessment Spine, Ribs and Pelvis - no instability, subluxation or laxity. Right Upper Extremity - no instability, subluxation or laxity.  Lymphatic Head & Neck  General Head & Neck Lymphatics: Bilateral - Description - No Localized lymphadenopathy. Axillary  General Axillary Region: Bilateral - Description - No Localized lymphadenopathy. Femoral & Inguinal  Generalized Femoral & Inguinal Lymphatics: Left - Description - No Localized lymphadenopathy. Right - Description - No Localized lymphadenopathy.    Assessment & Plan   PARAESOPHAGEAL HIATAL HERNIA (K44.9) Impression: Rather classic story of worsening heartburn and reflux refractory to maximal medical therapy now with hiatal hernia.  Persistent symptoms and reflux by impedance despite being on proton pump inhibitors. Worsening symptoms of hoarseness and reflux over the year. He initially wasn't ready to reconsider surgery. His wife came in as well. Discussed with her at length. Concerns about gas bloat and other issues discussed at length. He is exhausted nonsurgical options. He is interested in proceeding  now  Normal manometry. Normal gastric emptying study. Hopefully less likely to have severe dysphagia or bloating issues as a result  Negative ultrasound and HIDA scan for gallbladder etiology.  I think he would benefit from fundoplication and hiatal hernia repair. Given his relatively young age & in moderate activity level, I would probably do a absorbable mesh reinforcement of the repair to minimize recurrence. Discussed the surgical technique and dietary needs at length. He is interested in proceeding. 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 I cautioned against him return to work too early. Told him most likely 3 weeks before RTW moderate activity, at 6 weeks = unrestricted.   The anatomy & physiology of the foregut and anti-reflux mechanism was discussed. The pathophysiology of hiatal herniation and GERD was discussed. Natural history risks without surgery was discussed. The patient's symptoms are not adequately controlled by medicines and other non-operative treatments. I feel the risks of no intervention will lead to serious problems that outweigh the operative risks; therefore, I recommended surgery to reduce the hiatal hernia out of the chest and fundoplication to rebuild the anti-reflux valve and control reflux better. Need for a thorough workup to rule out the differential diagnosis and plan treatment was explained. I explained laparoscopic techniques with possible need for an open approach.  Risks such as bleeding, infection, abscess, leak, need for further treatment, heart attack, death, and other risks were discussed. I noted a good likelihood this will help address the problem. Goals of post-operative recovery were discussed as well. Possibility that this will not correct all symptoms was explained. Post-operative dysphagia, need for short-term liquid & pureed diet, inability to vomit, possibility of reherniation, possible need for medicines to help control  symptoms in addition to surgery were discussed. We will work to minimize complications. Educational handouts further explaining the pathology, treatment options, and dysphagia diet was given as well. Questions were answered. The patient expresses understanding & wishes to proceed with surgery.  Pt Education - [facility] Esophageal Surgery Diet HCI (Yanett Conkright): discussed with patient and provided information. Pt Education - [facility] Laparoscopic Surgery HCI  GASTROESOPHAGEAL REFLUX DISEASE, ESOPHAGITIS PRESENCE NOT SPECIFIED (K21.9)  Current Plans Pt Education - Pamphlet Given - Gastroesophagel Reflux Disease: discussed with patient and provided information.  Ardeth SportsmanSteven C. Naleah Kofoed, MD, FACS, MASCRS Gastrointestinal and Minimally Invasive Surgery    [address] [place], [place] [address] [phone] Main / Paging [phone] Fax

## 2018-06-20 NOTE — Interval H&P Note (Signed)
History and Physical Interval Note:  06/20/2018 6:51 AM  Jeremy DurhamBrian T Parma  has presented today for surgery, with the diagnosis of PARAESOPHAGEAL HIATAL HERNIA REFRACTORY TO MEDICAL MANAGEMENT  The various methods of treatment have been discussed with the patient and family. After consideration of risks, benefits and other options for treatment, the patient has consented to  Procedure(s): XI ROBOTIC REPAIR OF PARAESOPHAGEAL  HIATAL HERNIA WITH FUNDOPLICATION (N/A) as a surgical intervention .  The patient's history has been reviewed, patient examined, no change in status, stable for surgery.  I have reviewed the patient's chart and labs.  Questions were answered to the patient's satisfaction.    I have re-reviewed the the patient's records, history, medications, and allergies.  I have re-examined the patient.  I again discussed intraoperative plans and goals of post-operative recovery.  The patient agrees to proceed.  Jeremy DurhamBrian T Mallo  06/04/1965 [id number]  Patient Care Team: Maurice SmallGriffin, Elaine, MD as PCP - General (Family Medicine) [name], Feven Alderfer, MD as Consulting Physician (General Surgery) Jeani HawkingHung, Patrick, MD as Consulting Physician (Gastroenterology)  Patient Active Problem List   Diagnosis Date Noted  . Hiatal hernia 04/21/2018  . Hoarseness 04/21/2018  . Acute medial meniscal tear 10/12/2015  . Essential hypertension 11/05/2014  . Anxiety state 01/25/2009    Past Medical History:  Diagnosis Date  . Anxiety   . Complication of anesthesia    Difficult to wake up after anesthesia per wife  . Depression   . GERD (gastroesophageal reflux disease) 03-27-11   tx. Nexium  . Headache   . History of hiatal hernia   . History of inguinal hernia repair    BIH  . Hyperlipidemia   . Hypertension   . Seasonal allergies   . Squamous cell carcinoma in situ (SCCIS) of skin of face     Past Surgical History:  Procedure Laterality Date  . ESOPHAGEAL MANOMETRY N/A 07/10/2017   Procedure: ESOPHAGEAL  MANOMETRY (EM);  Surgeon: Jeani HawkingHung, Patrick, MD;  Location: [facility];  Service: Endoscopy;  Laterality: N/A;  . INGUINAL HERNIA REPAIR  04/02/2011   Procedure: LAPAROSCOPIC BILATERAL INGUINAL HERNIA REPAIR;  Surgeon: Adolph Pollackodd J Rosenbower, MD;  Location: [facility];  Service: General;  Laterality: Bilateral;  laparoscopic repair bilateral inguinal hernia  . KNEE ARTHROSCOPY Left 10/12/2015   Procedure: LEFT ARTHROSCOPY KNEE WITH MENISCAL DEBRIDEMENT;  Surgeon: Ollen GrossFrank Aluisio, MD;  Location: [facility];  Service: Orthopedics;  Laterality: Left;  . KNEE SURGERY  2009   right  . NOSE SURGERY  200/2003/2008/2012  . PH IMPEDANCE STUDY N/A 07/10/2017   Procedure: PH IMPEDANCE STUDY;  Surgeon: Jeani HawkingHung, Patrick, MD;  Location: [facility];  Service: Endoscopy;  Laterality: N/A;  . SHOULDER SURGERY  2004   left  . VARICOCELE EXCISION  2000  . WRIST SURGERY Left 2013    Social History   Socioeconomic History  . Marital status: Married    Spouse name: Not on file  . Number of children: Not on file  . Years of education: Not on file  . Highest education level: Not on file  Occupational History  . Not on file  Social Needs  . Financial resource strain: Not on file  . Food insecurity:    Worry: Not on file    Inability: Not on file  . Transportation needs:    Medical: Not on file    Non-medical: Not on file  Tobacco Use  . Smoking status: Never Smoker  . Smokeless tobacco: Never Used  Substance and Sexual Activity  .  Alcohol use: No  . Drug use: No  . Sexual activity: Yes  Lifestyle  . Physical activity:    Days per week: Not on file    Minutes per session: Not on file  . Stress: Not on file  Relationships  . Social connections:    Talks on phone: Not on file    Gets together: Not on file    Attends religious service: Not on file    Active member of club or organization: Not on file    Attends meetings of clubs or organizations: Not on file    Relationship status: Not on file  . Intimate  partner violence:    Fear of current or ex partner: Not on file    Emotionally abused: Not on file    Physically abused: Not on file    Forced sexual activity: Not on file  Other Topics Concern  . Not on file  Social History Narrative  . Not on file    History reviewed. No pertinent family history.  Medications Prior to Admission  Medication Sig Dispense Refill Last Dose  . buPROPion (WELLBUTRIN XL) 150 MG 24 hr tablet Take 150 mg by mouth daily.   06/20/2018 at 0415  . EPINEPHrine (EPIPEN 2-PAK) 0.3 mg/0.3 mL IJ SOAJ injection Inject 0.3 mg into the muscle once as needed (For anaphylaxis.).    never  . lisinopril-hydrochlorothiazide (PRINZIDE,ZESTORETIC) 10-12.5 MG tablet Take 1 tablet by mouth daily.   06/19/2018 at Unknown time  . pantoprazole (PROTONIX) 40 MG tablet Take 40 mg by mouth 2 (two) times daily.   06/20/2018 at 0415  . pravastatin (PRAVACHOL) 40 MG tablet Take 40 mg by mouth daily.   06/19/2018 at Unknown time  . AMBULATORY NON FORMULARY MEDICATION Inject into the muscle 2 (two) times a week. Medication Name:allergy shots   06/16/2018  . sucralfate (CARAFATE) 1 g tablet Take 1 tablet (1 g total) by mouth 4 (four) times daily -  with meals and at bedtime. 120 tablet 0     Current Facility-Administered Medications  Medication Dose Route Frequency Provider Last Rate Last Dose  . bupivacaine liposome (EXPAREL) 1.3 % injection 266 mg  20 mL Infiltration On Call to OR Karie Soda, MD      . clindamycin (CLEOCIN) 900 MG/50ML IVPB           . gentamicin (GARAMYCIN) 380 mg in dextrose 5 % 100 mL IVPB  380 mg Intravenous On Call to OR Karie Soda, MD      . lactated ringers infusion   Intravenous Continuous Jairo Ben, MD 50 mL/hr at 06/20/18 [phone]    . scopolamine (TRANSDERM-SCOP) 1 MG/3DAYS 1.5 mg  1 patch Transdermal Once Jairo Ben, MD   1.5 mg at 06/20/18 [id number]     Allergies  Allergen Reactions  . Eggs Or Egg-Derived Products Anaphylaxis  . Amoxil [Amoxicillin  Trihydrate] Itching and Other (See Comments)    Has patient had a PCN reaction causing immediate rash, facial/tongue/throat swelling, SOB or lightheadedness with hypotension: no Has patient had a PCN reaction causing severe rash involving mucus membranes or skin necrosis: no Has patient had a PCN reaction that required hospitalization no Has patient had a PCN reaction occurring within the last 10 years: yes If all of the above answers are "NO", then may proceed with Cephalosporin use.  . Banana Itching  . Skelaxin Itching    BP 108/67   Pulse 74   Temp 98.2 F (36.8 C) (Oral)   Resp  18   Ht 5' 5.5" (1.664 m)   Wt 78 kg   SpO2 97%   BMI 28.19 kg/m   Labs: No results found for this or any previous visit (from the past 48 hour(s)).  Imaging / Studies: No results found.   [name], M.D., F.A.C.S. Gastrointestinal and Minimally Invasive Surgery [facility] [address] [place], [place] [address] [phone] Main / Paging  06/20/2018 6:51 AM    [name]

## 2018-06-20 NOTE — Op Note (Signed)
06/20/2018  10:25 AM  PATIENT:  Jeremy Diaz  53 y.o. male  Patient Care Team: Maurice Small, MD as PCP - General (Family Medicine) Karie Soda, MD as Consulting Physician (General Surgery) Jeani Hawking, MD as Consulting Physician (Gastroenterology)  PRE-OPERATIVE DIAGNOSIS:  PARAESOPHAGEAL HIATAL HERNIA REFRACTORY TO MEDICAL MANAGEMENT  POST-OPERATIVE DIAGNOSIS:   PARAESOPHAGEAL HIATAL HERNIA REFRACTORY TO MEDICAL MANAGEMENT  PROCEDURE:    1. ROBOTIC reduction of paraesophageal hiatal hernia 2. Type II mediastinal dissection. 3. Primary repair of hiatal hernia over pledgets.  4. Anterior & posterior gastropexy. 5. Nissen fundoplication 2 cm over a 60-French bougie 6. Mesh reinforcement with absorbable mesh  SURGEON:  [name], MD  ASSIST:  Romie Levee, MD, FACS. An experienced assistant was required given the standard of surgical care given the complexity of the case.  This assistant was needed for exposure, dissection, suctioning, retraction, instrument exchange, etc.  EBL:  Total I/O In: 1000 [I.V.:1000] Out: 75 [Blood:75]  Delay start of Pharmacological VTE agent (>24hrs) due to surgical blood loss or risk of bleeding:  no  ANESTHESIA: 1. General anesthesia. 2. Local anesthetic in a field block around all port sites. 3.  Nerve block provided with liposomal bupivacaine (Experel) mixed with 0.25% bupivacaine as a Bilateral TAP block x30 mL at the level of the transverse abdominis & subcostal ridge along the flank at the mid axillary line under laparoscopic guidance   SPECIMEN:  Mediastinal hernia sac (not sent).  DRAINS:  NONE  COUNTS:  YES  PLAN OF CARE: Admit for overnight observation  PATIENT DISPOSITION:  PACU - hemodynamically stable.  INDICATION:   Patient with symptomatic paraesophageal hiatal hernia.  The patient has had extensive work-up & we feel the patient will benefit from repair:  The anatomy & physiology of the foregut and  anti-reflux mechanism was discussed.  The pathophysiology of hiatal herniation and GERD was discussed.  Natural history risks without surgery was discussed.   The patient's symptoms are not adequately controlled by medicines and other non-operative treatments.  I feel the risks of no intervention will lead to serious problems that outweigh the operative risks; therefore, I recommended surgery to reduce the hiatal hernia out of the chest and fundoplication to rebuild the anti-reflux valve and control reflux better.  Need for a thorough workup to rule out the differential diagnosis and plan treatment was explained.  I explained laparoscopic techniques with possible need for an open approach.  Risks such as bleeding, infection, abscess, leak, need for further treatment, heart attack, death, and other risks were discussed.   I noted a good likelihood this will help address the problem.  Goals of post-operative recovery were discussed as well.  Possibility that this will not correct all symptoms was explained.  Post-operative dysphagia, need for short-term liquid & pureed diet, inability to vomit, possibility of reherniation, possible need for medicines to help control symptoms in addition to surgery were discussed.  We will work to minimize complications.   Educational handouts further explaining the pathology, treatment options, and dysphagia diet was given as well.  Questions were answered.  The patient expresses understanding & wishes to proceed with surgery.  OR FINDINGS:   Moderate-sized paraesophageal hiatal hernia with 10% of the stomach in the mediastinum.  There was a 8 x 6 cm hiatal defect.  It is a primary repair over pledgets. Mesh reinforcement was used with Mesh was used: PhasixT Mesh (a knitted monofilament mesh scaffold using Poly-4-hydroxybutyrate (P4HB), a biologically derived, fully resorbable material)  The patient has a 2 cm Nissen fundoplication that was done over 60-French bougie.  The  patient has had anterior and posterior gastropexy.  DESCRIPTION:   Informed consent was confirmed.  The patient received IV antibiotics prior to incision.  The underwent general anesthesia without difficulty.  A Foley catheter sterilely placed.  The patient was positioned in split leg with arms tucked. The abdomen was prepped and draped in the sterile fashion.  Surgical time-out confirmed our plan.  I placed a 5 mm port in the left subcostal region using Varess entry technique with the patient in steep reverse Trendelenburg and left side up.  Entry was clean.  We induced carbon dioxide insufflation.  Camera inspection revealed no injury.  Under direct visualization, I placed extra ports.  I also placed a 5 mm port in the left subxiphoid region under direct visualization.  I removed that and placed an Omega-shaped rigid Nathanson liver retractor to lift the left lateral sector of the liver anteriorly to expose the esophageal hiatus.  Confirmed a hiatal hernia.  Moderate sized defect although not major herniation.  The liver retractor was secured to the bed using the iron man system.  The Xi robot was carefully docked and instruments placed and advanced under direct visualization.  We focused on dissection.  We grasped the anterior mediastinal sac at the apex of the crus.  I scored through that and got into the anterior mediastinum.  I was able to free the mediastinal sac from its attachments to the pericardium and bilateral pleura using primarily focused gentle blunt dissection as well as focused vessel sealer dissection.  I transected phrenoesophageal attachments to the inner right crus, preserving a two centimeter cuff of mediastinal sac until I found the base of the crura.  I then came around anteriorly on the left side and freed up the phrenoesophageal attachments of the mediastinal sac on the medial part of the left crus on the superior half.  I did careful mediastinal dissection to free the mediastinal  sac.  With that, we could relieve the suction cup affect of the hernia sac and help reduce the stomach back down into the abdomen, flipped back approriately.    We ligated the short gastrics along the lesser curvature of the stomach about a third the way down and then came up proximally over the fundus.  We released the attachments of the stomach to the retroperitoneum until we were able to connect with the prior dissection on the left crus.  We completed the release of phrenoesophageal attachments to the medial part of the left crus down to its base.  With this, we had circumferential mobilization.    We placed the stomach and esophagus on axial tension.  I then did a Type II mediastinal dissection where I freed the esophagus from its attachments to the aorta, spine, pleura, and pericardium using primarily gentle blunt as well as focused ultrasonic dissection.  We saw the anterior & posterior vagus nerves intact.  We preserved it at all times.  I procedded to dissect about 20 cm proximally into the mediastinum.  With that I could straighten out the esophagus and get 5 cm of intra-abdominal length of the esophagus at a best estimation.  I freed the anterior mediastinal sac off the esophagus & stomach.  It was not major but it did help better define the esophagogastric junction with its skeletonization and removal.  We saw the anterior vagus nerve and freed the sac off of the vagus.  I dissected out & removed the fatty epiphrenic pads at the esophagogastric junction. With that, I could better define the esophagogastric junction.  I confirmed the the patient had 5 cm of intra-abdominal esophageal length off tension.  I brought the fundus of the stomach posterior to the esophagus over to the right side.  The wrap was mobile with the classic shoe shine maneuver.  Wrap became together gently.  We reflected the stomach left laterally and closed the esophageal hiatus using 0 Ethibond stitch using horizontal  mattress stitches with pledgets on both sides.  I did that x3 stitches.  The crura had good substance and they came together well without any tension.  Because of the young age with moderate size actual defect, I reinforced the repair using a 15 x 10 cm biologic Phasix mesh.  I cut out a 2x4 cm part of mesh in the middle third of the mesh such that the mesh had a broad U shape transversely, one tail 5 cm wide & the other 8cm.  We brought the mesh in and laid it over the crural repair, tails anterior over the crura.  I tucked the broader "U" tail of the mesh between the left diaphragm and the spleen, the narrower "U" tail over the right crus.  I then secured the posterior & anterior corners of the narrower"U" tail with 0 Ethibond upper interrupted suture to the right crus.  I secured to the left lateral and left superior sides of the broader "U" tail to the left diaphragm band with 2-0 V lock running suture.   I brought the fundus of the stomach behind the esophagus and cardia to set up a fundoplication wrap.  I did a posterior gastropexy by taking of #1 Ethibond stitches to the posterior part of the right side of the wrap and thru the Phasix mesh and crural closure.  I placed a similar stitch on the left anterior side as well.  That way the stomach covered the mesh and protected it from any esophageal exposure.  With the anterior and posterior gastropexy's, stomach laid well for a fundoplication wrap.   Next, Anesthesia passed a 60-French bougie transorally while I placed the stomach and esophagus under axial tension.  It passed down easily without resistance.    I then did a classic 2 cm Nissen fundoplication on the true esophagus above the cardia using Ethibond stitch in the left side of the wrap, then anterior esophagus, and then right side of the wrap and tied that down. Did 3 stitches.  I measured it and it was 2 cm in length.  We removed the bougie.  It was intact.    The wrap was soft and floppy.   Visit was done a large mediastinal hernia sac defect, I did not feel to be due to the lisinopril.  I did freed off lateral sector off the anterior diaphragm.  Apparently through the anterior table the phasic smash to lay over of the mid anterior diaphragm and anterior to the hiatus.  I placed some horizontal mattress Ethibond sutures to help secure the superior inner corners of the Phasix mesh flat.  I did irrigation and ensured hemostasis.  I saw no evidence of any leak or perforation or other abnormality.  I removed the [facility] liver retractor under direct visualization.  I evacuated carbon dioxide and removed the ports.  The skin was closed with Monocryl and sterile dressings applied.  The patient is being extubated and brought back to the recovery room.  I discussed postop care in detail with the patient and family in in the office.  Discussed again with the patient & his wife in the holding area.  I discussed operative findings, updated the patient's status, discussed probable steps to recovery, and gave postoperative recommendations to the patient's family.  Recommendations were made.  Questions were answered.  They expressed understanding & appreciation.  [name], M.D., F.A.C.S. Gastrointestinal and Minimally Invasive Surgery [facility] [address] [place], [place] [address] [phone] Main / Paging

## 2018-06-20 NOTE — Anesthesia Procedure Notes (Signed)
Procedure Name: Intubation Date/Time: 06/20/2018 7:30 AM Performed by: [place] Franca Stakes C, CRNA Pre-anesthesia Checklist: Patient identified, Emergency Drugs available, Suction available and Patient being monitored Patient Re-evaluated:Patient Re-evaluated prior to induction Oxygen Delivery Method: Circle system utilized Preoxygenation: Pre-oxygenation with 100% oxygen Induction Type: IV induction, Cricoid Pressure applied and Rapid sequence Laryngoscope Size: Mac and 3 Grade View: Grade I Tube type: Oral Tube size: 7.0 mm Number of attempts: 1 Airway Equipment and Method: Stylet and Oral airway Placement Confirmation: ETT inserted through vocal cords under direct vision,  positive ETCO2 and breath sounds checked- equal and bilateral Secured at: 20 cm Tube secured with: Tape Dental Injury: Teeth and Oropharynx as per pre-operative assessment

## 2018-06-20 NOTE — Discharge Instructions (Signed)
EATING AFTER YOUR ESOPHAGEAL SURGERY °(Stomach Fundoplication, Hiatal Hernia repair, Achalasia surgery, etc) ° °###################################################################### ° °EAT °Start with a pureed / full liquid diet (see below) °Gradually transition to a high fiber diet with a fiber supplement over the next month after discharge.   ° °WALK °Walk an hour a day.  Control your pain to do that.   ° °CONTROL PAIN °Control pain so that you can walk, sleep, tolerate sneezing/coughing, go up/down stairs. ° °HAVE A BOWEL MOVEMENT DAILY °Keep your bowels regular to avoid problems.  OK to try a laxative to override constipation.  OK to use an antidairrheal to slow down diarrhea.  Call if not better after 2 tries ° °CALL IF YOU HAVE PROBLEMS/CONCERNS °Call if you are still struggling despite following these instructions. °Call if you have concerns not answered by these instructions ° °###################################################################### ° ° °After your esophageal surgery, expect some sticking with swallowing over the next 1-2 months.   ° °If food sticks when you eat, it is called "dysphagia".  This is due to swelling around your esophagus at the wrap & hiatal diaphragm repair.  It will gradually ease off over the next few months.  To help you through this temporary phase, we start you out on a pureed (blenderized) diet. ° °Your first meal in the hospital was thin liquids.  You should have been given a pureed diet by the time you left the hospital.  We ask patients to stay on a pureed diet for the first 2-3 weeks to avoid anything getting "stuck" near your recent surgery.  Don't be alarmed if your ability to swallow doesn't progress according to this plan.  Everyone is different and some diets can advance more or less quickly.   ° ° °Some BASIC RULES to follow are: °· Maintain an upright position whenever eating or drinking. °· Take small bites - just a teaspoon size bite at a time. °· Eat slowly.   It may also help to eat only one food at a time. °· Consider nibbling through smaller, more frequent meals & avoid the urge to eat BIG meals °· Do not push through feelings of fullness, nausea, or bloatedness °· Do not mix solid foods and liquids in the same mouthful °· Try not to "wash foods down" with large gulps of liquids. °· Avoid carbonated (bubbly/fizzy) drinks.   °· Avoid foods that make you feel gassy or bloated.  Start with bland foods first.  Wait on trying greasy, fried, or spicy meals until you are tolerating more bland solids well. °· Understand that it will be hard to burp and belch at first.  This gradually improves with time.  Expect to be more gassy/flatulent/bloated initially.  Walking will help your body manage it better. °· Consider using medications for bloating that contain simethicone such as  Maalox or Gas-X  °· Eat in a relaxed atmosphere & minimize distractions. °· Avoid talking while eating.   °· Do not use straws. °· Following each meal, sit in an upright position (90 degree angle) for 60 to 90 minutes.  Going for a short walk can help as well °· If food does stick, don't panic.  Try to relax and let the food pass on its own.  Sipping WARM LIQUID such as strong hot black tea can also help slide it down. ° ° °Be gradual in changes & use common sense: ° °-If you easily tolerating a certain "level" of foods, advance to the next level gradually °-If you are   having trouble swallowing a particular food, then avoid it.   °-If food is sticking when you advance your diet, go back to thinner previous diet (the lower LEVEL) for 1-2 days. ° °LEVEL 1 = PUREED DIET ° °Do for the first 2 WEEKS AFTER SURGERY ° °-Foods in this group are pureed or blenderized to a smooth, mashed potato-like consistency.  °-If necessary, the pureed foods can keep their shape with the addition of a thickening agent.   °-Meat should be pureed to a smooth, pasty consistency.  Hot broth or gravy may be added to the pureed  meat, approximately 1 oz. of liquid per 3 oz. serving of meat. °-CAUTION:  If any foods do not puree into a smooth consistency, swallowing will be more difficult.  (For example, nuts or seeds sometimes do not blend well.) ° °Hot Foods Cold Foods  °Pureed scrambled eggs and cheese Pureed cottage cheese  °Baby cereals Thickened juices and nectars  °Thinned cooked cereals (no lumps) Thickened milk or eggnog  °Pureed French toast or pancakes Ensure  °Mashed potatoes Ice cream  °Pureed parsley, au gratin, scalloped potatoes, candied sweet potatoes Fruit or Italian ice, sherbet  °Pureed buttered or alfredo noodles Plain yogurt  °Pureed vegetables (no corn or peas) Instant breakfast  °Pureed soups and creamed soups Smooth pudding, mousse, custard  °Pureed scalloped apples Whipped gelatin  °Gravies Sugar, syrup, honey, jelly  °Sauces, cheese, tomato, barbecue, white, creamed Cream  °Any baby food Creamer  °Alcohol in moderation (not beer or champagne) Margarine  °Coffee or tea Mayonnaise  ° Ketchup, mustard  ° Apple sauce  ° °SAMPLE MENU:  PUREED DIET °Breakfast Lunch Dinner  °· Orange juice, 1/2 cup °· Cream of wheat, 1/2 cup · Pineapple juice, 1/2 cup · Pureed turkey, barley soup, 3/4 cup °· Pureed Hawaiian chicken, 3 oz  °· Scrambled eggs, mashed or blended with cheese, 1/2 cup °· Tea or coffee, 1 cup  °· Whole milk, 1 cup  °· Non-dairy creamer, 2 Tbsp. · Mashed potatoes, 1/2 cup °· Pureed cooled broccoli, 1/2 cup °· Apple sauce, 1/2 cup °· Coffee or tea · Mashed potatoes, 1/2 cup °· Pureed spinach, 1/2 cup °· Frozen yogurt, 1/2 cup °· Tea or coffee  ° ° ° ° °LEVEL 2 = SOFT DIET ° °After your first 2 weeks, you can advance to a soft diet.   °Keep on this diet until everything goes down easily. ° °Hot Foods Cold Foods  °White fish Cottage cheese  °Stuffed fish Junior baby fruit  °Baby food meals Semi thickened juices  °Minced soft cooked, scrambled, poached eggs nectars  °Souffle & omelets Ripe mashed bananas  °Cooked  cereals Canned fruit, pineapple sauce, milk  °potatoes Milkshake  °Buttered or Alfredo noodles Custard  °Cooked cooled vegetable Puddings, including tapioca  °Sherbet Yogurt  °Vegetable soup or alphabet soup Fruit ice, Italian ice  °Gravies Whipped gelatin  °Sugar, syrup, honey, jelly Junior baby desserts  °Sauces:  Cheese, creamed, barbecue, tomato, white Cream  °Coffee or tea Margarine  ° °SAMPLE MENU:  LEVEL 2 °Breakfast Lunch Dinner  °· Orange juice, 1/2 cup °· Oatmeal, 1/2 cup °· Scrambled eggs with cheese, 1/2 cup °· Decaffeinated tea, 1 cup °· Whole milk, 1 cup °· Non-dairy creamer, 2 Tbsp · Pineapple juice, 1/2 cup °· Minced beef, 3 oz °· Gravy, 2 Tbsp °· Mashed potatoes, 1/2 cup °· Minced fresh broccoli, 1/2 cup °· Applesauce, 1/2 cup °· Coffee, 1 cup · Turkey, barley soup, 3/4 cup °·   Minced Hawaiian chicken, 3 oz °· Mashed potatoes, 1/2 cup °· Cooked spinach, 1/2 cup °· Frozen yogurt, 1/2 cup °· Non-dairy creamer, 2 Tbsp  ° ° ° ° °LEVEL 3 = CHOPPED DIET ° °-After all the foods in level 2 (soft diet) are passing through well you should advance up to more chopped foods.  °-It is still important to cut these foods into small pieces and eat slowly. ° °Hot Foods Cold Foods  °Poultry Cottage cheese  °Chopped Swedish meatballs Yogurt  °Meat salads (ground or flaked meat) Milk  °Flaked fish (tuna) Milkshakes  °Poached or scrambled eggs Soft, cold, dry cereal  °Souffles and omelets Fruit juices or nectars  °Cooked cereals Chopped canned fruit  °Chopped French toast or pancakes Canned fruit cocktail  °Noodles or pasta (no rice) Pudding, mousse, custard  °Cooked vegetables (no frozen peas, corn, or mixed vegetables) Green salad  °Canned small sweet peas Ice cream  °Creamed soup or vegetable soup Fruit ice, Italian ice  °Pureed vegetable soup or alphabet soup Non-dairy creamer  °Ground scalloped apples Margarine  °Gravies Mayonnaise  °Sauces:  Cheese, creamed, barbecue, tomato, white Ketchup  °Coffee or tea Mustard   ° °SAMPLE MENU:  LEVEL 3 °Breakfast Lunch Dinner  °· Orange juice, 1/2 cup °· Oatmeal, 1/2 cup °· Scrambled eggs with cheese, 1/2 cup °· Decaffeinated tea, 1 cup °· Whole milk, 1 cup °· Non-dairy creamer, 2 Tbsp °· Ketchup, 1 Tbsp °· Margarine, 1 tsp °· Salt, 1/4 tsp °· Sugar, 2 tsp · Pineapple juice, 1/2 cup °· Ground beef, 3 oz °· Gravy, 2 Tbsp °· Mashed potatoes, 1/2 cup °· Cooked spinach, 1/2 cup °· Applesauce, 1/2 cup °· Decaffeinated coffee °· Whole milk °· Non-dairy creamer, 2 Tbsp °· Margarine, 1 tsp °· Salt, 1/4 tsp · Pureed turkey, barley soup, 3/4 cup °· Barbecue chicken, 3 oz °· Mashed potatoes, 1/2 cup °· Ground fresh broccoli, 1/2 cup °· Frozen yogurt, 1/2 cup °· Decaffeinated tea, 1 cup °· Non-dairy creamer, 2 Tbsp °· Margarine, 1 tsp °· Salt, 1/4 tsp °· Sugar, 1 tsp  ° ° °LEVEL 4:  REGULAR FOODS ° °-Foods in this group are soft, moist, regularly textured foods.   °-This level includes meat and breads, which tend to be the hardest things to swallow.   °-Eat very slowly, chew well and continue to avoid carbonated drinks. °-most people are at this level in 4-6 weeks ° °Hot Foods Cold Foods  °Baked fish or skinned Soft cheeses - cottage cheese  °Souffles and omelets Cream cheese  °Eggs Yogurt  °Stuffed shells Milk  °Spaghetti with meat sauce Milkshakes  °Cooked cereal Cold dry cereals (no nuts, dried fruit, coconut)  °French toast or pancakes Crackers  °Buttered toast Fruit juices or nectars  °Noodles or pasta (no rice) Canned fruit  °Potatoes (all types) Ripe bananas  °Soft, cooked vegetables (no corn, lima, or baked beans) Peeled, ripe, fresh fruit  °Creamed soups or vegetable soup Cakes (no nuts, dried fruit, coconut)  °Canned chicken noodle soup Plain doughnuts  °Gravies Ice cream  °Bacon dressing Pudding, mousse, custard  °Sauces:  Cheese, creamed, barbecue, tomato, white Fruit ice, Italian ice, sherbet  °Decaffeinated tea or coffee Whipped gelatin  °Pork chops Regular gelatin  ° Canned fruited  gelatin molds  ° Sugar, syrup, honey, jam, jelly  ° Cream  ° Non-dairy  ° Margarine  ° Oil  ° Mayonnaise  ° Ketchup  ° Mustard  ° °TROUBLESHOOTING IRREGULAR BOWELS  °1) Avoid extremes of bowel   movements (no bad constipation/diarrhea)  °2) Miralax 17gm mixed in 8oz. water or juice-daily. May use BID as needed.  °3) Gas-x,Phazyme, etc. as needed for gas & bloating.  °4) Soft,bland diet. No spicy,greasy,fried foods.  °5) Prilosec over-the-counter as needed  °6) May hold gluten/wheat products from diet to see if symptoms improve.  °7) May try probiotics (Align, Activa, etc) to help calm the bowels down  °7) If symptoms become worse call back immediately. ° ° ° °If you have any questions please call our office at [facility]: [phone]. ° ° °LAPAROSCOPIC SURGERY: POST OP INSTRUCTIONS ° °###################################################################### ° °EAT °Gradually transition to a high fiber diet with a fiber supplement over the next few weeks after discharge.  Start with a pureed / full liquid diet (see below) ° °WALK °Walk an hour a day.  Control your pain to do that.   ° °CONTROL PAIN °Control pain so that you can walk, sleep, tolerate sneezing/coughing, go up/down stairs. ° °HAVE A BOWEL MOVEMENT DAILY °Keep your bowels regular to avoid problems.  OK to try a laxative to override constipation.  OK to use an antidairrheal to slow down diarrhea.  Call if not better after 2 tries ° °CALL IF YOU HAVE PROBLEMS/CONCERNS °Call if you are still struggling despite following these instructions. °Call if you have concerns not answered by these instructions ° °###################################################################### ° ° ° °1. DIET: Follow a light bland diet the first 24 hours after arrival home, such as soup, liquids, crackers, etc.  Be sure to include lots of fluids daily.  Avoid fast food or heavy meals as your are more likely to get nauseated.  Eat a low fat the next few days after  surgery.   ° °2. Take your usually prescribed home medications unless otherwise directed. ° °3. PAIN CONTROL: °a. Pain is best controlled by a usual combination of three different methods TOGETHER: °i. Ice/Heat °ii. Over the counter pain medication °iii. Prescription pain medication °b. Most patients will experience some swelling and bruising around the incisions.  Ice packs or heating pads (30-60 minutes up to 6 times a day) will help. Use ice for the first few days to help decrease swelling and bruising, then switch to heat to help relax tight/sore spots and speed recovery.  Some people prefer to use ice alone, heat alone, alternating between ice & heat.  Experiment to what works for you.  Swelling and bruising can take several weeks to resolve.   °c. It is helpful to take an over-the-counter pain medication regularly for the first few weeks.  Choose one of the following that works best for you: °i. Naproxen (Aleve, etc)  Two 220mg tabs twice a day °ii. Ibuprofen (Advil, etc) Three 200mg tabs four times a day (every meal & bedtime) °iii. Acetaminophen (Tylenol, etc) 500-650mg four times a day (every meal & bedtime) °d. A  prescription for pain medication (such as oxycodone, hydrocodone, tramadol, gabapentin, methocarbamol, etc) should be given to you upon discharge.  Take your pain medication as prescribed.  °i. If you are having problems/concerns with the prescription medicine (does not control pain, nausea, vomiting, rash, itching, etc), please call us [phone] to see if we need to switch you to a different pain medicine that will work better for you and/or control your side effect better. °ii. If you need a refill on your pain medication, please give us 48 hour notice.  contact your pharmacy.  They will contact our office to request authorization. Prescriptions will   not be filled after 5 pm or on week-ends ° °4. Avoid getting constipated.   °a. Between the surgery and the pain medications, it is common to  experience some constipation.   °b. Increasing fluid intake and taking a fiber supplement (such as Metamucil, Citrucel, FiberCon, MiraLax, etc) 1-2 times a day regularly will usually help prevent this problem from occurring.   °c. A mild laxative (prune juice, Milk of Magnesia, MiraLax, etc) should be taken according to package directions if there are no bowel movements after 48 hours.   °5. Watch out for diarrhea.   °a. If you have many loose bowel movements, simplify your diet to bland foods & liquids for a few days.   °b. Stop any stool softeners and decrease your fiber supplement.   °c. Switching to mild anti-diarrheal medications (Kayopectate, Pepto Bismol) can help.   °d. If this worsens or does not improve, please call us. ° °6. Wash / shower every day.  You may shower over the dressings as they are waterproof.  Continue to shower over incision(s) after the dressing is off. ° °7. Remove your waterproof bandages 5 days after surgery.  You may leave the incision open to air.  You may replace a dressing/Band-Aid to cover the incision for comfort if you wish.  ° °8. ACTIVITIES as tolerated:   °a. You may resume regular (light) daily activities beginning the next day--such as daily self-care, walking, climbing stairs--gradually increasing activities as tolerated.  If you can walk 30 minutes without difficulty, it is safe to try more intense activity such as jogging, treadmill, bicycling, low-impact aerobics, swimming, etc. °b. Save the most intensive and strenuous activity for last such as sit-ups, heavy lifting, contact sports, etc  Refrain from any heavy lifting or straining until you are off narcotics for pain control.   °c. DO NOT PUSH THROUGH PAIN.  Let pain be your guide: If it hurts to do something, don't do it.  Pain is your body warning you to avoid that activity for another week until the pain goes down. °d. You may drive when you are no longer taking prescription pain medication, you can comfortably  wear a seatbelt, and you can safely maneuver your car and apply brakes. °e. You may have sexual intercourse when it is comfortable. ° °9. FOLLOW UP in our office °a. Please call [facility] at [phone] to set up an appointment to see your surgeon in the office for a follow-up appointment approximately 2-3 weeks after your surgery. °b. Make sure that you call for this appointment the day you arrive home to insure a convenient appointment time. ° °10. IF YOU HAVE DISABILITY OR FAMILY LEAVE FORMS, BRING THEM TO THE OFFICE FOR PROCESSING.  DO NOT GIVE THEM TO YOUR DOCTOR. ° ° °WHEN TO CALL US [phone]: °1. Poor pain control °2. Reactions / problems with new medications (rash/itching, nausea, etc)  °3. Fever over 101.5 F (38.5 C) °4. Inability to urinate °5. Nausea and/or vomiting °6. Worsening swelling or bruising °7. Continued bleeding from incision. °8. Increased pain, redness, or drainage from the incision ° ° The clinic staff is available to answer your questions during regular business hours (8:30am-5pm).  Please don’t hesitate to call and ask to speak to one of our nurses for clinical concerns.  ° If you have a medical emergency, go to the nearest emergency room or call 911. ° A surgeon from [facility] is always on call at the hospitals ° ° °[facility] °[address], [address], [place], [place]  [address] ? °MAIN: [phone] ? TOLL FREE: [phone] ?  °FAX [phone] °[email or web address] ° ° °

## 2018-06-21 ENCOUNTER — Observation Stay (HOSPITAL_COMMUNITY): Payer: BLUE CROSS/BLUE SHIELD

## 2018-06-21 DIAGNOSIS — F329 Major depressive disorder, single episode, unspecified: Secondary | ICD-10-CM | POA: Diagnosis not present

## 2018-06-21 DIAGNOSIS — K219 Gastro-esophageal reflux disease without esophagitis: Secondary | ICD-10-CM | POA: Diagnosis not present

## 2018-06-21 DIAGNOSIS — E785 Hyperlipidemia, unspecified: Secondary | ICD-10-CM | POA: Diagnosis not present

## 2018-06-21 DIAGNOSIS — I10 Essential (primary) hypertension: Secondary | ICD-10-CM | POA: Diagnosis not present

## 2018-06-21 DIAGNOSIS — Z79899 Other long term (current) drug therapy: Secondary | ICD-10-CM | POA: Diagnosis not present

## 2018-06-21 DIAGNOSIS — Z9889 Other specified postprocedural states: Secondary | ICD-10-CM | POA: Diagnosis not present

## 2018-06-21 DIAGNOSIS — Z888 Allergy status to other drugs, medicaments and biological substances status: Secondary | ICD-10-CM | POA: Diagnosis not present

## 2018-06-21 DIAGNOSIS — Z880 Allergy status to penicillin: Secondary | ICD-10-CM | POA: Diagnosis not present

## 2018-06-21 DIAGNOSIS — K449 Diaphragmatic hernia without obstruction or gangrene: Secondary | ICD-10-CM | POA: Diagnosis not present

## 2018-06-21 HISTORY — PX: HERNIA REPAIR: SHX51

## 2018-06-21 MED ORDER — IOPAMIDOL (ISOVUE-300) INJECTION 61%
50.0000 mL | Freq: Once | INTRAVENOUS | Status: AC | PRN
  Administered 2018-06-21: 50 mL via ORAL

## 2018-06-21 MED ORDER — IOPAMIDOL (ISOVUE-300) INJECTION 61%
INTRAVENOUS | Status: AC
  Filled 2018-06-21: qty 50

## 2018-06-21 NOTE — Progress Notes (Signed)
I have reviewed and concur with this student's documentation.   

## 2018-06-21 NOTE — Discharge Summary (Signed)
Physician Discharge Summary    Patient ID: Jeremy Diaz MRN: [id number] DOB/AGE: 1965/07/29  53 y.o.  Patient Care Team: Maurice Small, MD as PCP - General (Family Medicine) Karie Soda, MD as Consulting Physician (General Surgery) Jeani Hawking, MD as Consulting Physician (Gastroenterology)  Admit date: 06/20/2018  Discharge date: 06/21/2018  Hospital Stay = 0 days    Discharge Diagnoses:  Principal Problem:   Hiatal hernia s/p robotic repair & Nissen fundoplication 06/20/2018 Active Problems:   Anxiety state   Essential hypertension   Hoarseness   History of Nissen fundoplication 06/20/2018   1 Day Post-Op  06/20/2018  POST-OPERATIVE DIAGNOSIS:   PARAESOPHAGEAL HIATAL HERNIA   SURGERY:  06/20/2018  Procedure(s): XI ROBOTIC REPAIR OF PARAESOPHAGEAL  HIATAL HERNIA WITH FUNDOPLICATION, WITH MESH  SURGEON:    Surgeon(s): Karie Soda, MD Romie Levee, MD  Consults: None  Hospital Course:   Patient with significant heartburn reflux and esophagitis with sliding hiatal hernia.  Worsening symptoms.  He underwent robotic reduction and repair of hiatal hernia with Nissen fundoplication.    Postoperatively, the patient gradually mobilized.  Esophagram on postoperative day 1 showed no obstruction or leak.  He was advanced to a pured dysphagia 1 type diet per post fundoplication protocol.   Pain and other symptoms were treated aggressively.    By the time of discharge, the patient was walking well the hallways, eating food, having flatus.  Pain was well-controlled on an oral medications.  Based on meeting discharge criteria and continuing to recover, I felt it was safe for the patient to be discharged from the hospital to further recover with close followup. Postoperative recommendations were discussed in detail.  They are written as well.  Discharged Condition: good  Discharge Exam: Blood pressure 120/70, pulse (!) 58, temperature 98.1 F (36.7 C), temperature source  Oral, resp. rate 16, height 5' 5.5" (1.664 m), weight 78 kg, SpO2 95 %.  General: Pt awake/alert/oriented x4 in No acute distress Eyes: PERRL, normal EOM.  Sclera clear.  No icterus Neuro: CN II-XII intact w/o focal sensory/motor deficits. Lymph: No head/neck/groin lymphadenopathy Psych:  No delerium/psychosis/paranoia HENT: Normocephalic, Mucus membranes moist.  No thrush Neck: Supple, No tracheal deviation Chest:  No chest wall pain w good excursion CV:  Pulses intact.  Regular rhythm MS: Normal AROM mjr joints.  No obvious deformity Abdomen: Soft.  Mildly distended.  Mildly tender at incisions only.  No evidence of peritonitis.  No incarcerated hernias. Ext:  SCDs BLE.  No mjr edema.  No cyanosis Skin: No petechiae / purpura   Disposition:   Follow-up Information    Karie Soda, MD. Schedule an appointment as soon as possible for a visit in 3 weeks.   Specialty:  General Surgery Why:  To follow up after your operation, To follow up after your hospital stay Contact information: [address] [place] [address] [phone]           Discharge disposition: 01-Home or Self Care       Discharge Instructions    Call MD for:   Complete by:  As directed    Temperature > 101.6F   Call MD for:   Complete by:  As directed    Temperature > 101.6F   Call MD for:  extreme fatigue   Complete by:  As directed    Call MD for:  extreme fatigue   Complete by:  As directed    Call MD for:  hives   Complete by:  As directed    Call MD for:  hives   Complete by:  As directed    Call MD for:  persistant nausea and vomiting   Complete by:  As directed    Call MD for:  persistant nausea and vomiting   Complete by:  As directed    Call MD for:  redness, tenderness, or signs of infection (pain, swelling, redness, odor or green/yellow discharge around incision site)   Complete by:  As directed    Call MD for:  redness, tenderness, or signs of infection (pain,  swelling, redness, odor or green/yellow discharge around incision site)   Complete by:  As directed    Call MD for:  severe uncontrolled pain   Complete by:  As directed    Call MD for:  severe uncontrolled pain   Complete by:  As directed    Diet general   Complete by:  As directed    SEE ESOPHAGEAL SURGERY DIET INSTRUCTIONS  We using usually start you out on a pureed (blenderized) diet. Expect some sticking with swallowing over the next 1-2 months.   This is due to swelling around your esophagus at the wrap & hiatal diaphragm repair.  It will gradually ease off over the next few months.   Diet general   Complete by:  As directed    SEE ESOPHAGEAL SURGERY DIET INSTRUCTIONS  We using usually start you out on a pureed (blenderized) diet. Expect some sticking with swallowing over the next 1-2 months.   This is due to swelling around your esophagus at the wrap & hiatal diaphragm repair.  It will gradually ease off over the next few months.   Discharge instructions   Complete by:  As directed    Please see discharge instruction sheets.   Also refer to any handouts/printouts that may have been given from the [facility] surgery office (if you visited [place] there before surgery) Please call our office if you have any questions or concerns [phone]   Discharge instructions   Complete by:  As directed    Please see discharge instruction sheets.   Also refer to any handouts/printouts that may have been given from the [facility] surgery office (if you visited [place] there before surgery) Please call our office if you have any questions or concerns [phone]   Driving Restrictions   Complete by:  As directed    No driving until off narcotics and can safely swerve away without pain during an emergency   Driving Restrictions   Complete by:  As directed    No driving until off narcotics and can safely swerve away without pain during an emergency   Increase activity slowly   Complete by:  As directed     Increase activity slowly   Complete by:  As directed    Lifting restrictions   Complete by:  As directed    Avoid heavy lifting initially, <20 pounds at first.   Do not push through pain.   You have no specific weight limit: If it hurts to do, DON'T DO IT.    If you feel no pain, you are not injuring anything.  Pain will protect you from injury.   Coughing and sneezing are far more stressful to your incision than any lifting.   Avoid resuming heavy lifting (>50 pounds) or other intense activity until off all narcotic pain medications.   When want to exercise more, give yourself 2 weeks to gradually get back to full intense  exercise/activity.   Lifting restrictions   Complete by:  As directed    Avoid heavy lifting initially, <20 pounds at first.   Do not push through pain.   You have no specific weight limit: If it hurts to do, DON'T DO IT.    If you feel no pain, you are not injuring anything.  Pain will protect you from injury.   Coughing and sneezing are far more stressful to your incision than any lifting.   Avoid resuming heavy lifting (>50 pounds) or other intense activity until off all narcotic pain medications.   When want to exercise more, give yourself 2 weeks to gradually get back to full intense exercise/activity.   May shower / Bathe   Complete by:  As directed    SHOWER EVERY DAY.  It is fine for dressings or wounds to be washed/rinsed.  Use gentle soap & water.  This will help the incisions and/or wounds get clean & minimize infection.   May shower / Bathe   Complete by:  As directed    SHOWER EVERY DAY.  It is fine for dressings or wounds to be washed/rinsed.  Use gentle soap & water.  This will help the incisions and/or wounds get clean & minimize infection.   May walk up steps   Complete by:  As directed    May walk up steps   Complete by:  As directed    Remove dressing in 72 hours   Complete by:  As directed    You have closed incisions: Shower and bathe over  these incisions with soap and water every day.  It is OK to wash over the dressings: they are waterproof. Remove all surgical dressings on postoperative day #3.  You do not need to replace dressings over the closed incisions unless you feel more comfortable with a Band-Aid covering it.   Please call our office [phone] if you have further questions.   Remove dressing in 72 hours   Complete by:  As directed    You have closed incisions: Shower and bathe over these incisions with soap and water every day.  It is OK to wash over the dressings: they are waterproof. Remove all surgical dressings on postoperative day #3.  You do not need to replace dressings over the closed incisions unless you feel more comfortable with a Band-Aid covering it.   Please call our office [phone] if you have further questions.   Sexual Activity Restrictions   Complete by:  As directed    Sexual activity as tolerated.  Do not push through pain.  Pain will protect you from injury.   Sexual Activity Restrictions   Complete by:  As directed    Sexual activity as tolerated.  Do not push through pain.  Pain will protect you from injury.   Walk with assistance   Complete by:  As directed    Walk over an hour a day.  May use a walker/cane/companion to help with balance and stamina.   Walk with assistance   Complete by:  As directed    Walk over an hour a day.  May use a walker/cane/companion to help with balance and stamina.      Allergies as of 06/21/2018      Reactions   Eggs Or Egg-derived Products Anaphylaxis   Amoxil [amoxicillin Trihydrate] Itching, Other (See Comments)   Has patient had a PCN reaction causing immediate rash, facial/tongue/throat swelling, SOB or lightheadedness with hypotension: no Has patient had  a PCN reaction causing severe rash involving mucus membranes or skin necrosis: no Has patient had a PCN reaction that required hospitalization no Has patient had a PCN reaction occurring  within the last 10 years: yes If all of the above answers are "NO", then may proceed with Cephalosporin use.   Banana Itching   Skelaxin Itching      Medication List    STOP taking these medications   sucralfate 1 g tablet Commonly known as:  CARAFATE     TAKE these medications   AMBULATORY NON FORMULARY MEDICATION Inject into the muscle 2 (two) times a week. Medication Name:allergy shots   buPROPion 150 MG 24 hr tablet Commonly known as:  WELLBUTRIN XL Take 150 mg by mouth daily.   EPIPEN 2-PAK 0.3 mg/0.3 mL Soaj injection Generic drug:  EPINEPHrine Inject 0.3 mg into the muscle once as needed (For anaphylaxis.).   lisinopril-hydrochlorothiazide 10-12.5 MG tablet Commonly known as:  PRINZIDE,ZESTORETIC Take 1 tablet by mouth daily.   ondansetron 4 MG tablet Commonly known as:  ZOFRAN Take 1 tablet (4 mg total) by mouth every 8 (eight) hours as needed for nausea.   pantoprazole 40 MG tablet Commonly known as:  PROTONIX Take 40 mg by mouth 2 (two) times daily.   pravastatin 40 MG tablet Commonly known as:  PRAVACHOL Take 40 mg by mouth daily.   promethazine 25 MG suppository Commonly known as:  PHENERGAN Place 1 suppository (25 mg total) rectally every 6 (six) hours as needed for nausea.   traMADol 50 MG tablet Commonly known as:  ULTRAM Take 1-2 tablets (50-100 mg total) by mouth every 6 (six) hours as needed for moderate pain or severe pain.       Significant Diagnostic Studies:  No results found for this or any previous visit (from the past 72 hour(s)).  No results found.  Past Medical History:  Diagnosis Date  . Anxiety   . Complication of anesthesia    Difficult to wake up after anesthesia per wife  . Depression   . GERD (gastroesophageal reflux disease) 03-27-11   tx. Nexium  . Headache   . Hiatal hernia s/p robotic repair & Nissen fundoplication 06/20/2018 06/20/2018  . History of hiatal hernia   . History of inguinal hernia repair    BIH  .  History of Nissen fundoplication 06/20/2018 06/20/2018  . Hyperlipidemia   . Hypertension   . Seasonal allergies   . Squamous cell carcinoma in situ (SCCIS) of skin of face     Past Surgical History:  Procedure Laterality Date  . ESOPHAGEAL MANOMETRY N/A 07/10/2017   Procedure: ESOPHAGEAL MANOMETRY (EM);  Surgeon: Jeani HawkingHung, Patrick, MD;  Location: [facility];  Service: Endoscopy;  Laterality: N/A;  . INGUINAL HERNIA REPAIR  04/02/2011   Procedure: LAPAROSCOPIC BILATERAL INGUINAL HERNIA REPAIR;  Surgeon: Adolph Pollackodd J Rosenbower, MD;  Location: [facility];  Service: General;  Laterality: Bilateral;  laparoscopic repair bilateral inguinal hernia  . KNEE ARTHROSCOPY Left 10/12/2015   Procedure: LEFT ARTHROSCOPY KNEE WITH MENISCAL DEBRIDEMENT;  Surgeon: Ollen GrossFrank Aluisio, MD;  Location: [facility];  Service: Orthopedics;  Laterality: Left;  . KNEE SURGERY  2009   right  . NOSE SURGERY  200/2003/2008/2012  . PH IMPEDANCE STUDY N/A 07/10/2017   Procedure: PH IMPEDANCE STUDY;  Surgeon: Jeani HawkingHung, Patrick, MD;  Location: [facility];  Service: Endoscopy;  Laterality: N/A;  . SHOULDER SURGERY  2004   left  . VARICOCELE EXCISION  2000  . WRIST SURGERY Left 2013  Social History   Socioeconomic History  . Marital status: Married    Spouse name: Not on file  . Number of children: Not on file  . Years of education: Not on file  . Highest education level: Not on file  Occupational History  . Not on file  Social Needs  . Financial resource strain: Not on file  . Food insecurity:    Worry: Not on file    Inability: Not on file  . Transportation needs:    Medical: Not on file    Non-medical: Not on file  Tobacco Use  . Smoking status: Never Smoker  . Smokeless tobacco: Never Used  Substance and Sexual Activity  . Alcohol use: No  . Drug use: No  . Sexual activity: Yes  Lifestyle  . Physical activity:    Days per week: Not on file    Minutes per session: Not on file  . Stress: Not on file  Relationships  .  Social connections:    Talks on phone: Not on file    Gets together: Not on file    Attends religious service: Not on file    Active member of club or organization: Not on file    Attends meetings of clubs or organizations: Not on file    Relationship status: Not on file  . Intimate partner violence:    Fear of current or ex partner: Not on file    Emotionally abused: Not on file    Physically abused: Not on file    Forced sexual activity: Not on file  Other Topics Concern  . Not on file  Social History Narrative  . Not on file    History reviewed. No pertinent family history.  Current Facility-Administered Medications  Medication Dose Route Frequency Provider Last Rate Last Dose  . acetaminophen (TYLENOL) tablet 1,000 mg  1,000 mg Oral Trixie DeisQ8H Jaydence Arnesen, MD   1,000 mg at 06/21/18 0547  . bisacodyl (DULCOLAX) suppository 10 mg  10 mg Rectal Daily PRN [name], Niles Ess, MD      . buPROPion (WELLBUTRIN XL) 24 hr tablet 150 mg  150 mg Oral Daily [name], Holger Sokolowski, MD   150 mg at 06/21/18 1058  . diphenhydrAMINE (BENADRYL) 12.5 MG/5ML elixir 12.5 mg  12.5 mg Oral Q6H PRN [name], Sumit Branham, MD       Or  . diphenhydrAMINE (BENADRYL) injection 12.5 mg  12.5 mg Intravenous Q6H PRN [name], Alaysha Jefcoat, MD      . enoxaparin (LOVENOX) injection 40 mg  40 mg Subcutaneous Q24H [name], Jerrick Farve, MD   40 mg at 06/21/18 0829  . gabapentin (NEURONTIN) capsule 300 mg  300 mg Oral BID [name], Lopaka Karge, MD   300 mg at 06/21/18 1103  . hydrALAZINE (APRESOLINE) injection 5-20 mg  5-20 mg Intravenous Q4H PRN [name], Terrye Dombrosky, MD      . lisinopril (PRINIVIL,ZESTRIL) tablet 10 mg  10 mg Oral Daily [name], Madeline Pho, MD   10 mg at 06/21/18 1058   And  . hydrochlorothiazide (MICROZIDE) capsule 12.5 mg  12.5 mg Oral Daily [name], Wister Hoefle, MD   12.5 mg at 06/21/18 1058  . HYDROmorphone (DILAUDID) injection 0.5-2 mg  0.5-2 mg Intravenous Q2H PRN [name], Jocob Dambach, MD      . iopamidol (ISOVUE-300) 61 % injection           . lactated ringers  infusion 1,000 mL  1,000 mL Intravenous Q8H PRN [name], Placida Cambre, MD      . lip balm (CARMEX) ointment 1 application  1 application Topical BID Karie Soda, MD   1 application at 06/21/18 1104  . magic mouthwash  15 mL Oral QID PRN Karie Soda, MD      . methocarbamol (ROBAXIN) tablet 750 mg  750 mg Oral Q6H PRN Karie Soda, MD   750 mg at 06/20/18 1543  . metoprolol tartrate (LOPRESSOR) injection 5 mg  5 mg Intravenous Q6H PRN Karie Soda, MD      . ondansetron (ZOFRAN-ODT) disintegrating tablet 4 mg  4 mg Oral Q6H PRN Karie Soda, MD       Or  . ondansetron [facility]) injection 4 mg  4 mg Intravenous Q6H PRN Karie Soda, MD      . oxyCODONE (Oxy IR/ROXICODONE) immediate release tablet 5-10 mg  5-10 mg Oral Q4H PRN Karie Soda, MD   5 mg at 06/21/18 0547  . pantoprazole (PROTONIX) EC tablet 40 mg  40 mg Oral BID Karie Soda, MD   40 mg at 06/21/18 1059  . polyethylene glycol (MIRALAX / GLYCOLAX) packet 17 g  17 g Oral Daily PRN Karie Soda, MD      . pravastatin (PRAVACHOL) tablet 40 mg  40 mg Oral QPM Karie Soda, MD   40 mg at 06/20/18 1807  . prochlorperazine (COMPAZINE) tablet 10 mg  10 mg Oral Q6H PRN Karie Soda, MD       Or  . prochlorperazine (COMPAZINE) injection 5-10 mg  5-10 mg Intravenous Q6H PRN Karie Soda, MD      . simethicone (MYLICON) 40 MG/0.6ML suspension 40 mg  40 mg Oral QID Karie Soda, MD   40 mg at 06/20/18 2143  . simethicone (MYLICON) chewable tablet 40 mg  40 mg Oral Q6H PRN Karie Soda, MD   40 mg at 06/21/18 1610     Allergies  Allergen Reactions  . Eggs Or Egg-Derived Products Anaphylaxis  . Amoxil [Amoxicillin Trihydrate] Itching and Other (See Comments)    Has patient had a PCN reaction causing immediate rash, facial/tongue/throat swelling, SOB or lightheadedness with hypotension: no Has patient had a PCN reaction causing severe rash involving mucus membranes or skin necrosis: no Has patient had a PCN reaction that required  hospitalization no Has patient had a PCN reaction occurring within the last 10 years: yes If all of the above answers are "NO", then may proceed with Cephalosporin use.  . Banana Itching  . Skelaxin Itching    Signed: Lorenso Courier, MD, FACS, MASCRS Gastrointestinal and Minimally Invasive Surgery    [address] [place], [place] [address] [phone] Main / Paging [phone] Fax   06/21/2018, 11:51 AM

## 2018-06-23 ENCOUNTER — Encounter (HOSPITAL_COMMUNITY): Payer: Self-pay | Admitting: Surgery

## 2018-06-25 ENCOUNTER — Emergency Department (HOSPITAL_COMMUNITY): Payer: BLUE CROSS/BLUE SHIELD

## 2018-06-25 ENCOUNTER — Other Ambulatory Visit: Payer: Self-pay

## 2018-06-25 ENCOUNTER — Inpatient Hospital Stay (HOSPITAL_COMMUNITY)
Admission: EM | Admit: 2018-06-25 | Discharge: 2018-06-27 | DRG: 919 | Disposition: A | Discharge status: Home / Self Care | Payer: BLUE CROSS/BLUE SHIELD | Attending: Internal Medicine | Admitting: Internal Medicine

## 2018-06-25 ENCOUNTER — Encounter (HOSPITAL_COMMUNITY): Payer: Self-pay

## 2018-06-25 DIAGNOSIS — R0602 Shortness of breath: Secondary | ICD-10-CM | POA: Diagnosis not present

## 2018-06-25 DIAGNOSIS — T888XXA Other specified complications of surgical and medical care, not elsewhere classified, initial encounter: Secondary | ICD-10-CM | POA: Diagnosis not present

## 2018-06-25 DIAGNOSIS — R0689 Other abnormalities of breathing: Secondary | ICD-10-CM | POA: Diagnosis not present

## 2018-06-25 DIAGNOSIS — Z79891 Long term (current) use of opiate analgesic: Secondary | ICD-10-CM

## 2018-06-25 DIAGNOSIS — R600 Localized edema: Secondary | ICD-10-CM | POA: Diagnosis present

## 2018-06-25 DIAGNOSIS — Y838 Other surgical procedures as the cause of abnormal reaction of the patient, or of later complication, without mention of misadventure at the time of the procedure: Secondary | ICD-10-CM | POA: Diagnosis present

## 2018-06-25 DIAGNOSIS — J90 Pleural effusion, not elsewhere classified: Secondary | ICD-10-CM | POA: Diagnosis not present

## 2018-06-25 DIAGNOSIS — J189 Pneumonia, unspecified organism: Secondary | ICD-10-CM | POA: Diagnosis not present

## 2018-06-25 DIAGNOSIS — R4702 Dysphasia: Secondary | ICD-10-CM | POA: Diagnosis present

## 2018-06-25 DIAGNOSIS — N179 Acute kidney failure, unspecified: Secondary | ICD-10-CM | POA: Diagnosis present

## 2018-06-25 DIAGNOSIS — Z86008 Personal history of in-situ neoplasm of other site: Secondary | ICD-10-CM

## 2018-06-25 DIAGNOSIS — Y95 Nosocomial condition: Secondary | ICD-10-CM | POA: Diagnosis present

## 2018-06-25 DIAGNOSIS — Z79899 Other long term (current) drug therapy: Secondary | ICD-10-CM | POA: Diagnosis not present

## 2018-06-25 DIAGNOSIS — J95811 Postprocedural pneumothorax: Secondary | ICD-10-CM | POA: Diagnosis present

## 2018-06-25 DIAGNOSIS — Z881 Allergy status to other antibiotic agents status: Secondary | ICD-10-CM | POA: Diagnosis not present

## 2018-06-25 DIAGNOSIS — R000 Tachycardia, unspecified: Secondary | ICD-10-CM | POA: Diagnosis not present

## 2018-06-25 DIAGNOSIS — J302 Other seasonal allergic rhinitis: Secondary | ICD-10-CM | POA: Diagnosis not present

## 2018-06-25 DIAGNOSIS — Z888 Allergy status to other drugs, medicaments and biological substances status: Secondary | ICD-10-CM

## 2018-06-25 DIAGNOSIS — E785 Hyperlipidemia, unspecified: Secondary | ICD-10-CM | POA: Diagnosis not present

## 2018-06-25 DIAGNOSIS — K449 Diaphragmatic hernia without obstruction or gangrene: Secondary | ICD-10-CM

## 2018-06-25 DIAGNOSIS — J181 Lobar pneumonia, unspecified organism: Secondary | ICD-10-CM

## 2018-06-25 DIAGNOSIS — F419 Anxiety disorder, unspecified: Secondary | ICD-10-CM | POA: Diagnosis present

## 2018-06-25 DIAGNOSIS — I10 Essential (primary) hypertension: Secondary | ICD-10-CM | POA: Diagnosis not present

## 2018-06-25 DIAGNOSIS — J939 Pneumothorax, unspecified: Secondary | ICD-10-CM | POA: Diagnosis present

## 2018-06-25 DIAGNOSIS — G894 Chronic pain syndrome: Secondary | ICD-10-CM

## 2018-06-25 DIAGNOSIS — Z91018 Allergy to other foods: Secondary | ICD-10-CM | POA: Diagnosis not present

## 2018-06-25 DIAGNOSIS — F329 Major depressive disorder, single episode, unspecified: Secondary | ICD-10-CM | POA: Diagnosis present

## 2018-06-25 DIAGNOSIS — Z9889 Other specified postprocedural states: Secondary | ICD-10-CM

## 2018-06-25 DIAGNOSIS — K219 Gastro-esophageal reflux disease without esophagitis: Secondary | ICD-10-CM | POA: Diagnosis not present

## 2018-06-25 DIAGNOSIS — K210 Gastro-esophageal reflux disease with esophagitis: Secondary | ICD-10-CM

## 2018-06-25 DIAGNOSIS — Z91012 Allergy to eggs: Secondary | ICD-10-CM | POA: Diagnosis not present

## 2018-06-25 DIAGNOSIS — F411 Generalized anxiety disorder: Secondary | ICD-10-CM | POA: Diagnosis present

## 2018-06-25 LAB — BASIC METABOLIC PANEL
Anion gap: 13 (ref 5–15)
BUN: 16 mg/dL (ref 6–20)
CALCIUM: 9.4 mg/dL (ref 8.9–10.3)
CO2: 25 mmol/L (ref 22–32)
Chloride: 96 mmol/L — ABNORMAL LOW (ref 98–111)
Creatinine, Ser: 1.44 mg/dL — ABNORMAL HIGH (ref 0.61–1.24)
GFR calc Af Amer: >60 mL/min (ref >60)
GFR calc non Af Amer: 56 mL/min — ABNORMAL LOW (ref >60)
Glucose, Bld: 137 mg/dL — ABNORMAL HIGH (ref 70–99)
Potassium: 4.2 mmol/L (ref 3.5–5.1)
SODIUM: 134 mmol/L — AB (ref 135–145)

## 2018-06-25 LAB — CBC
HCT: 43.5 % (ref 39.0–52.0)
Hemoglobin: 14.9 g/dL (ref 13.0–17.0)
MCH: 32 pg (ref 26.0–34.0)
MCHC: 34.3 g/dL (ref 30.0–36.0)
MCV: 93.5 fL (ref 80.0–100.0)
Platelets: 322 10*3/uL (ref 150–400)
RBC: 4.65 MIL/uL (ref 4.22–5.81)
RDW: 11.7 % (ref 11.5–15.5)
WBC: 12.2 10*3/uL — ABNORMAL HIGH (ref 4.0–10.5)
nRBC: 0 % (ref 0.0–0.2)

## 2018-06-25 LAB — I-STAT TROPONIN, ED: Troponin i, poc: 0 ng/mL (ref 0.00–0.08)

## 2018-06-25 LAB — LACTIC ACID, PLASMA: Lactic Acid, Venous: 1.2 mmol/L (ref 0.5–1.9)

## 2018-06-25 LAB — STREP PNEUMONIAE URINARY ANTIGEN: Strep Pneumo Urinary Antigen: NEGATIVE

## 2018-06-25 MED ORDER — ACETAMINOPHEN 325 MG PO TABS
650.0000 mg | ORAL_TABLET | Freq: Four times a day (QID) | ORAL | Status: DC
  Administered 2018-06-25 – 2018-06-26 (×3): 650 mg via ORAL
  Filled 2018-06-25 (×3): qty 2

## 2018-06-25 MED ORDER — SODIUM CHLORIDE 0.9 % IV SOLN
2.0000 g | Freq: Two times a day (BID) | INTRAVENOUS | Status: DC
  Administered 2018-06-25 – 2018-06-26 (×3): 2 g via INTRAVENOUS
  Filled 2018-06-25 (×4): qty 2

## 2018-06-25 MED ORDER — IBUPROFEN 200 MG PO TABS
600.0000 mg | ORAL_TABLET | Freq: Three times a day (TID) | ORAL | Status: DC
  Administered 2018-06-25 – 2018-06-27 (×6): 600 mg via ORAL
  Filled 2018-06-25 (×3): qty 3
  Filled 2018-06-25: qty 1
  Filled 2018-06-25 (×2): qty 3

## 2018-06-25 MED ORDER — VANCOMYCIN HCL 10 G IV SOLR
1500.0000 mg | Freq: Once | INTRAVENOUS | Status: AC
  Administered 2018-06-25: 1500 mg via INTRAVENOUS
  Filled 2018-06-25: qty 1500

## 2018-06-25 MED ORDER — IOPAMIDOL (ISOVUE-370) INJECTION 76%
100.0000 mL | Freq: Once | INTRAVENOUS | Status: AC | PRN
  Administered 2018-06-25: 100 mL via INTRAVENOUS

## 2018-06-25 MED ORDER — TRAZODONE HCL 50 MG PO TABS: 50.0000 mg | ORAL_TABLET | Freq: Every evening | ORAL | Status: DC | PRN

## 2018-06-25 MED ORDER — KETOROLAC TROMETHAMINE 15 MG/ML IJ SOLN
15.0000 mg | Freq: Once | INTRAMUSCULAR | Status: AC
  Administered 2018-06-25: 15 mg via INTRAVENOUS
  Filled 2018-06-25: qty 1

## 2018-06-25 MED ORDER — HYDROMORPHONE HCL 1 MG/ML IJ SOLN: 1.0000 mg | INTRAMUSCULAR | Status: DC | PRN

## 2018-06-25 MED ORDER — DOCUSATE SODIUM 100 MG PO CAPS
100.0000 mg | ORAL_CAPSULE | Freq: Every day | ORAL | Status: DC
  Administered 2018-06-25 – 2018-06-26 (×2): 100 mg via ORAL
  Filled 2018-06-25 (×3): qty 1

## 2018-06-25 MED ORDER — HYDROMORPHONE HCL 1 MG/ML IJ SOLN
1.0000 mg | Freq: Once | INTRAMUSCULAR | Status: AC
  Administered 2018-06-25: 1 mg via INTRAVENOUS
  Filled 2018-06-25: qty 1

## 2018-06-25 MED ORDER — SODIUM CHLORIDE 0.9 % IV BOLUS
1000.0000 mL | Freq: Once | INTRAVENOUS | Status: AC
  Administered 2018-06-25: 1000 mL via INTRAVENOUS

## 2018-06-25 MED ORDER — IOPAMIDOL (ISOVUE-370) INJECTION 76%
INTRAVENOUS | Status: AC
  Filled 2018-06-25: qty 100

## 2018-06-25 MED ORDER — SODIUM CHLORIDE 0.9 % IV SOLN
2.0000 g | Freq: Once | INTRAVENOUS | Status: AC
  Administered 2018-06-25: 2 g via INTRAVENOUS
  Filled 2018-06-25: qty 2

## 2018-06-25 MED ORDER — OXYCODONE HCL 5 MG PO TABS: 20.0000 mg | ORAL_TABLET | ORAL | Status: DC | PRN

## 2018-06-25 MED ORDER — FENTANYL CITRATE (PF) 100 MCG/2ML IJ SOLN
100.0000 ug | Freq: Once | INTRAMUSCULAR | Status: AC
  Administered 2018-06-25: 100 ug via INTRAVENOUS
  Filled 2018-06-25: qty 2

## 2018-06-25 MED ORDER — VANCOMYCIN HCL 10 G IV SOLR
1500.0000 mg | INTRAVENOUS | Status: DC
  Filled 2018-06-25: qty 1500

## 2018-06-25 MED ORDER — VANCOMYCIN HCL IN DEXTROSE 1-5 GM/200ML-% IV SOLN: 1000.0000 mg | Freq: Once | INTRAVENOUS | Status: DC

## 2018-06-25 MED ORDER — POTASSIUM CHLORIDE IN NACL 20-0.9 MEQ/L-% IV SOLN
INTRAVENOUS | Status: DC
  Administered 2018-06-25 – 2018-06-26 (×4): via INTRAVENOUS
  Filled 2018-06-25 (×4): qty 1000

## 2018-06-25 MED ORDER — OXYCODONE HCL 5 MG PO TABS: 10.0000 mg | ORAL_TABLET | ORAL | Status: DC | PRN

## 2018-06-25 MED ORDER — HEPARIN SODIUM (PORCINE) 5000 UNIT/ML IJ SOLN
5000.0000 [IU] | Freq: Three times a day (TID) | INTRAMUSCULAR | Status: DC
  Administered 2018-06-25 – 2018-06-26 (×3): 5000 [IU] via SUBCUTANEOUS
  Filled 2018-06-25 (×3): qty 1

## 2018-06-25 MED ORDER — SODIUM CHLORIDE 0.9 % IV SOLN
8.0000 mg | Freq: Four times a day (QID) | INTRAVENOUS | Status: DC | PRN
  Filled 2018-06-25: qty 4

## 2018-06-25 MED ORDER — ONDANSETRON HCL 4 MG/2ML IJ SOLN
INTRAMUSCULAR | Status: AC
  Administered 2018-06-25: 8 mg
  Filled 2018-06-25: qty 4

## 2018-06-25 NOTE — ED Notes (Signed)
Pt complaining of left chest pain worsening, Claudia PA notified and to order 1mg  dilaudid, BP 100/74, pt is axox4.

## 2018-06-25 NOTE — ED Notes (Signed)
[facility] for pt transport  

## 2018-06-25 NOTE — ED Notes (Signed)
[facility] here to transport pt 

## 2018-06-25 NOTE — Progress Notes (Signed)
Pharmacy Antibiotic Note  Jeremy Diaz is a 53 y.o. male admitted on 06/25/2018 with pneumonia.  Pharmacy has been consulted for vancomycin/cefepime dosing. Afebrile, WBC 12.2. SCr 1.44 on admit, CrCl~55-60. Hx "itching" reaction to amoxicillin in chart but has tolerated cephalosporins here in the past.  Plan: Cefepime 2g IV x 1; then 2g IV q12h Vancomycin 1500mg  IV x1; then Vancomycin 1500 mg IV Q 24 hrs. Goal AUC 400-550. Expected AUC: 541 SCr used: 1.44 Monitor clinical progress, c/s, renal function F/u de-escalation plan/LOT, vancomycin levels as indicated   Height: 5\' 7"  (170.2 cm) Weight: 165 lb (74.8 kg) IBW/kg (Calculated) : 66.1  Temp (24hrs), Avg:97.8 F (36.6 C), Min:97.8 F (36.6 C), Max:97.8 F (36.6 C)  Recent Labs  Lab 06/25/18 0911  WBC 12.2*  CREATININE 1.44*    Estimated Creatinine Clearance: 56.7 mL/min (A) (by C-G formula based on SCr of 1.44 mg/dL (H)).    Allergies  Allergen Reactions  . Eggs Or Egg-Derived Products Anaphylaxis  . Amoxil [Amoxicillin Trihydrate] Itching and Other (See Comments)    Has patient had a PCN reaction causing immediate rash, facial/tongue/throat swelling, SOB or lightheadedness with hypotension: no Has patient had a PCN reaction causing severe rash involving mucus membranes or skin necrosis: no Has patient had a PCN reaction that required hospitalization no Has patient had a PCN reaction occurring within the last 10 years: yes If all of the above answers are "NO", then may proceed with Cephalosporin use.  . Banana Itching  . Skelaxin Itching    Antimicrobials this admission: 2/5 vancomycin >>  2/5 cefepime >>   Dose adjustments this admission:   Microbiology results:   Babs Bertin, PharmD, BCPS Clinical Pharmacist 06/25/2018 10:43 AM

## 2018-06-25 NOTE — ED Triage Notes (Signed)
Pt from home with complaint of left sided chest pressure/shob x 2 days. Pt is 5 days post op from a hernia operation. Pt is visibly shob and tachy.

## 2018-06-25 NOTE — Progress Notes (Signed)
[facility]/Trauma Progress Note      Assessment/Plan  S/P Robotic reduction of hiatal hernia, type II mediastinal dissection, gastropexy, nissen fundoplication and mesh placement, Dr. Michaell Cowing, 01/31 - pt has been having bowel function, tolerating a FLD and denies vomiting.   Left Chest wall pain and SOB - CT chest showed small PTX, consolidation consistent with PNA of LLL with loculated effusion and small area of PNA of RLL - medicine admitting, management per medicine  FEN: FLD VTE: SCD's, heparin ID: Maxipime & Vanc 02/05>> Foley: none Follow up: TBD  DISPO: We will follow pt. Appreciate medicine.     LOS: 0 days    Subjective:  Jeremy Diaz is a 53 y.o. male with a hx of HDL, HTN,  hiatal hernia S/P Nissen fundoplication repair on June 20, 2018.  He presented to the ED with left-sided chest pain and shortness of breath.  Pain is sharp, severe, it is dull and constantly. Worsens with any movement, burping, or deep inspiration. Wife states pt was not doing well with the incentive spirometer at home. Pain radiates to his left shoulder.  Pain has been present since he woke up from surgery. Pt states it got worse yesterday with increased SOB. Wife at bedside states pt has been eating but not much.    Objective: Vital signs in last 24 hours: Temp:  [97.8 F (36.6 C)] 97.8 F (36.6 C) (02/05 0858) Pulse Rate:  [76-120] 81 (02/05 1530) Resp:  [13-24] 16 (02/05 1530) BP: (99-112)/(62-96) 108/96 (02/05 1515) SpO2:  [93 %-100 %] 97 % (02/05 1530) Weight:  [74.8 kg] 74.8 kg (02/05 0859)    Intake/Output from previous day: No intake/output data recorded. Intake/Output this shift: Total I/O In: 1600 [IV Piggyback:1600] Out: -   PE: Gen:  Alert, NAD, pleasant, cooperative HEENT:  in place  Card:  RRR, no M/G/R heard Pulm:  + friction rub & crackles of L lobe > in base, no W/R/R of R lobe, effort and rate increased  Abd: Soft, ND, +BS, port sites are well  healing and are without signs of infection. Mild generalized TTP without guarding. No peritonitis.  Skin: no rashes noted, warm and dry   Anti-infectives: Anti-infectives (From admission, onward)   Start     Dose/Rate Route Frequency Ordered Stop   06/26/18 1200  vancomycin (VANCOCIN) 1,500 mg in sodium chloride 0.9 % 500 mL IVPB     1,500 mg 250 mL/hr over 120 Minutes Intravenous Every 24 hours 06/25/18 1105     06/25/18 2200  ceFEPIme (MAXIPIME) 2 g in sodium chloride 0.9 % 100 mL IVPB     2 g 200 mL/hr over 30 Minutes Intravenous Every 12 hours 06/25/18 1047     06/25/18 1100  vancomycin (VANCOCIN) 1,500 mg in sodium chloride 0.9 % 500 mL IVPB     1,500 mg 250 mL/hr over 120 Minutes Intravenous  Once 06/25/18 1047 06/25/18 1358   06/25/18 1015  vancomycin (VANCOCIN) IVPB 1000 mg/200 mL premix  Status:  Discontinued     1,000 mg 200 mL/hr over 60 Minutes Intravenous  Once 06/25/18 1013 06/25/18 1047   06/25/18 1015  ceFEPIme (MAXIPIME) 2 g in sodium chloride 0.9 % 100 mL IVPB     2 g 200 mL/hr over 30 Minutes Intravenous  Once 06/25/18 1013 06/25/18 1143      Lab Results:  Recent Labs    06/25/18 0911  WBC 12.2*  HGB 14.9  HCT 43.5  PLT 322  BMET Recent Labs    06/25/18 0911  NA 134*  K 4.2  CL 96*  CO2 25  GLUCOSE 137*  BUN 16  CREATININE 1.44*  CALCIUM 9.4   PT/INR No results for input(s): LABPROT, INR in the last 72 hours. CMP     Component Value Date/Time   NA 134 (L) 06/25/2018 0911   K 4.2 06/25/2018 0911   CL 96 (L) 06/25/2018 0911   CO2 25 06/25/2018 0911   GLUCOSE 137 (H) 06/25/2018 0911   BUN 16 06/25/2018 0911   CREATININE 1.44 (H) 06/25/2018 0911   CALCIUM 9.4 06/25/2018 0911   PROT 8.0 06/09/2017 0950   ALBUMIN 4.5 06/09/2017 0950   AST 45 (H) 06/09/2017 0950   ALT 47 06/09/2017 0950   ALKPHOS 110 06/09/2017 0950   BILITOT 0.9 06/09/2017 0950   GFRNONAA 56 (L) 06/25/2018 0911   GFRAA >60 06/25/2018 0911   Lipase     Component  Value Date/Time   LIPASE 30 06/09/2017 0950    Studies/Results: Dg Chest 2 View  Result Date: 06/25/2018 CLINICAL DATA:  Shortness of breath and cough EXAM: CHEST - 2 VIEW COMPARISON:  June 17, 2016. FINDINGS: There is a small left apical pneumothorax without tension component. There is airspace consolidation in the posterior left base region. There is mild bibasilar atelectasis as well. The lungs elsewhere are clear. Heart size and pulmonary vascularity are normal. No adenopathy. No bone lesions appreciable. IMPRESSION: 1.  Small left apical pneumothorax without tension component. 2. Airspace consolidation consistent with pneumonia posterior left base. 3.  Bibasilar atelectasis. 4.  Heart size normal. Critical Value/emergent results were called by telephone at the time of interpretation on 06/25/2018 at 9:56 am to Dr. Gardner CandleM Butler. ED physician, Who verbally acknowledged these results. Electronically Signed   By: Bretta BangWilliam  Woodruff III M.D.   On: 06/25/2018 09:56   Ct Angio Chest Pe W And/or Wo Contrast  Result Date: 06/25/2018 CLINICAL DATA:  Shortness of breath and chest pain. Recent paraesophageal hernia repair EXAM: CT ANGIOGRAPHY CHEST WITH CONTRAST TECHNIQUE: Multidetector CT imaging of the chest was performed using the standard protocol during bolus administration of intravenous contrast. Multiplanar CT image reconstructions and MIPs were obtained to evaluate the vascular anatomy. CONTRAST:  100mL ISOVUE-370 IOPAMIDOL (ISOVUE-370) INJECTION 76% COMPARISON:  Chest radiograph June 25, 2018 FINDINGS: Cardiovascular: There is no demonstrable pulmonary embolus. There is no thoracic aortic aneurysm or dissection. The visualized great vessels appear unremarkable. There is no pericardial effusion or pericardial thickening. Mediastinum/Nodes: Visualized thyroid appears normal. There is no appreciable thoracic adenopathy. No esophageal lesions are evident. Lungs/Pleura: There is a moderate pleural effusion on  the left which appears partially loculated. There is airspace consolidation consistent with pneumonia in portions of the left lower lobe, primarily involving superior, medial, and posterior segments. There is also patchy infiltrate in portions of the posterolateral segments of the right lower lobe. There is atelectatic change in the inferior aspect of the right middle lobe. There is a small pneumothorax on the left without tension component. This pneumothorax is seen in the apical region. There is also a small amount of loculated air posteromedially on the left. There is no appreciable pneumomediastinum. Upper Abdomen: There are apparent pledgets the gastric cardia consistent with recent surgery. Visualized upper abdominal structures otherwise appear normal. Musculoskeletal: No blastic or lytic bone lesions. No fracture or dislocation evident. No chest wall lesions are appreciable. Review of the MIP images confirms the above findings. IMPRESSION: 1. No demonstrable pulmonary  embolus. No thoracic aortic aneurysm or dissection. 2. Small pneumothorax on the left without tension component, similar to chest radiograph obtained earlier in the day. 3. Consolidation consistent with pneumonia throughout much of the left lower lobe with associated partially loculated pleural effusion on the left. Smaller area of apparent pneumonia involving portions of the posterolateral segments of the right lower lobe. 4.  No appreciable thoracic adenopathy. Electronically Signed   By: Bretta BangWilliam  Woodruff III M.D.   On: 06/25/2018 12:45      Jerre SimonJessica L Enid Maultsby , [facility] 06/25/2018, 3:58 PM  Pager: [phone] Mon-Wed, Friday 7:00am-4:30pm Thurs 7am-11:30am  Consults: [phone]

## 2018-06-25 NOTE — ED Notes (Signed)
Dr Willette Pa at bedside with pt. Gave verbal order for 15mg  toradol IV

## 2018-06-25 NOTE — ED Provider Notes (Signed)
[facility] EMERGENCY DEPARTMENT Provider Note   CSN: [id number] Arrival date & time: 06/25/18  0845     History   Chief Complaint Chief Complaint  Patient presents with  . Shortness of Breath  . Chest Pain    HPI Jeremy Diaz is a 53 y.o. male with history HLD, HTN of hiatal hernia status post Nissen fundoplication repair on 1/31 is here for evaluation of left sided chest pain.  Described as sharp, 8/10.  Pain is dull and mild constantly but significantly worsens with any movement, burping, deep inspiration.  The pain radiates to his left shoulder, left axilla, left back.  Pain is slightly alleviated if he stays still or if he sits forward.  The pain actually was noticed upon waking up from surgery however initially it was mostly just in his left shoulder, since it has worsened.  It acutely worsened this morning.  He has spoken to his surgeon about this pain and was told that it was likely related to gas.  He has been burping, having bowel movements and there has been no improvement in the discomfort.  Had a normal esophagram day after surgery. Has been taking gabapentin 300 mg 3 times daily and oxycodone as prescribed by his surgeon without relief.  Associate symptoms include shortness of breath described as feeling restricted to take deep breaths and pain with inspiration.  No history of CAD.  No tobacco use.  No family history of CAD.  No personal history of DVT/PE.  No recent prolonged travel, immobilization, lower extremity edema or calf pain.  He stayed 1 night in the hospital after surgery but has been sedentary since.  No fevers, chills, cough.  HPI  Past Medical History:  Diagnosis Date  . Anxiety   . Complication of anesthesia    Difficult to wake up after anesthesia per wife  . Depression   . GERD (gastroesophageal reflux disease) 03-27-11   tx. Nexium  . Headache   . Hiatal hernia s/p robotic repair & Nissen fundoplication 06/20/2018 06/20/2018  . History  of hiatal hernia   . History of inguinal hernia repair    BIH  . History of Nissen fundoplication 06/20/2018 06/20/2018  . Hyperlipidemia   . Hypertension   . Seasonal allergies   . Squamous cell carcinoma in situ (SCCIS) of skin of face     Patient Active Problem List   Diagnosis Date Noted  . Hiatal hernia s/p robotic repair & Nissen fundoplication 06/20/2018 06/20/2018  . History of Nissen fundoplication 06/20/2018 06/20/2018  . Hoarseness 04/21/2018  . Lateral epicondylitis of right elbow 10/03/2017  . Acute medial meniscal tear 10/12/2015  . Essential hypertension 11/05/2014  . ED (erectile dysfunction) of organic origin 07/14/2012  . Vitamin D deficiency 07/14/2012  . Other testicular hypofunction 03/18/2012  . Hyperlipidemia 01/14/2012  . Depression 06/14/2010  . Anxiety state 01/25/2009    Past Surgical History:  Procedure Laterality Date  . ESOPHAGEAL MANOMETRY N/A 07/10/2017   Procedure: ESOPHAGEAL MANOMETRY (EM);  Surgeon: Jeani Hawking, MD;  Location: [facility];  Service: Endoscopy;  Laterality: N/A;  . HERNIA REPAIR  06/21/2018  . INGUINAL HERNIA REPAIR  04/02/2011   Procedure: LAPAROSCOPIC BILATERAL INGUINAL HERNIA REPAIR;  Surgeon: Adolph Pollack, MD;  Location: [facility];  Service: General;  Laterality: Bilateral;  laparoscopic repair bilateral inguinal hernia  . KNEE ARTHROSCOPY Left 10/12/2015   Procedure: LEFT ARTHROSCOPY KNEE WITH MENISCAL DEBRIDEMENT;  Surgeon: Ollen Gross, MD;  Location: [facility];  Service:  Orthopedics;  Laterality: Left;  . KNEE SURGERY  2009   right  . NOSE SURGERY  200/2003/2008/2012  . PH IMPEDANCE STUDY N/A 07/10/2017   Procedure: PH IMPEDANCE STUDY;  Surgeon: Jeani HawkingHung, Patrick, MD;  Location: [facility];  Service: Endoscopy;  Laterality: N/A;  . SHOULDER SURGERY  2004   left  . VARICOCELE EXCISION  2000  . WRIST SURGERY Left 2013  . XI ROBOTIC ASSISTED PARASTOMAL HERNIA REPAIR N/A 06/20/2018   Procedure: XI ROBOTIC REPAIR OF  PARAESOPHAGEAL  HIATAL HERNIA WITH FUNDOPLICATION, WITH MESH;  Surgeon: [name], Steven, MD;  Location: [facility];  Service: General;  Laterality: N/A;        Home Medications    Prior to Admission medications   Medication Sig Start Date End Date Taking? Authorizing Provider  AMBULATORY NON FORMULARY MEDICATION Inject into the muscle 2 (two) times a week. Medication Name:allergy shots    [provider]  buPROPion (WELLBUTRIN XL) 150 MG 24 hr tablet Take 150 mg by mouth daily.    [provider]  EPINEPHrine (EPIPEN 2-PAK) 0.3 mg/0.3 mL IJ SOAJ injection Inject 0.3 mg into the muscle once as needed (For anaphylaxis.).  04/30/13   [provider]  lisinopril-hydrochlorothiazide (PRINZIDE,ZESTORETIC) 10-12.5 MG tablet Take 1 tablet by mouth daily.    [provider]  ondansetron (ZOFRAN) 4 MG tablet Take 1 tablet (4 mg total) by mouth every 8 (eight) hours as needed for nausea. 06/20/18   [name], Steven, MD  pantoprazole (PROTONIX) 40 MG tablet Take 40 mg by mouth 2 (two) times daily.    [provider]  pravastatin (PRAVACHOL) 40 MG tablet Take 40 mg by mouth daily.    [provider]  promethazine (PHENERGAN) 25 MG suppository Place 1 suppository (25 mg total) rectally every 6 (six) hours as needed for nausea. 06/20/18   [name], Steven, MD  traMADol (ULTRAM) 50 MG tablet Take 1-2 tablets (50-100 mg total) by mouth every 6 (six) hours as needed for moderate pain or severe pain. 06/20/18   [name], Steven, MD    Family History History reviewed. No pertinent family history.  Social History Social History   Tobacco Use  . Smoking status: Never Smoker  . Smokeless tobacco: Never Used  Substance Use Topics  . Alcohol use: No  . Drug use: No     Allergies   Eggs or egg-derived products; Amoxil [amoxicillin trihydrate]; Banana; and Skelaxin   Review of Systems Review of Systems  Respiratory: Positive for shortness of breath.     Cardiovascular: Positive for chest pain.  All other systems reviewed and are negative.    Physical Exam Updated Vital Signs BP 103/76   Pulse 93   Temp 97.8 F (36.6 C) (Oral)   Resp (!) 21   Ht 5\' 7"  (1.702 m)   Wt 74.8 kg   SpO2 98%   BMI 25.84 kg/m   Physical Exam Constitutional:      Appearance: He is well-developed.     Comments: Looks uncomfortable but nontoxic.  HENT:     Head: Normocephalic and atraumatic.     Nose: Nose normal.  Eyes:     General: Lids are normal.     Conjunctiva/sclera: Conjunctivae normal.  Neck:     Musculoskeletal: Normal range of motion.     Trachea: Trachea normal.     Comments: Trachea midline.  Cardiovascular:     Rate and Rhythm: Normal rate and regular rhythm.     Pulses:  Carotid pulses are 2+ on the right side and 2+ on the left side.      Radial pulses are 2+ on the right side and 2+ on the left side.       Dorsalis pedis pulses are 2+ on the right side and 2+ on the left side.     Heart sounds: S1 normal and S2 normal.     Comments: Faint ?murmur vs rub vs gurgling to apex of heart/left axilla.  Pain noted with deep inspiration, laying flat. Improved with sitting forward.  1+ radial and DP pulses bilaterally.  No lower extremity edema or calf tenderness. Pulmonary:     Effort: Pulmonary effort is normal.     Breath sounds: Normal breath sounds.  Abdominal:     General: Bowel sounds are normal.     Palpations: Abdomen is soft.     Tenderness: There is no abdominal tenderness.     Comments: No epigastric tenderness. No distention. Surgical scars noted w/o erythema, edema, drainage.   Skin:    General: Skin is warm and dry.     Capillary Refill: Capillary refill takes less than 2 seconds.     Comments: No rash or bruising to chest wall  Neurological:     Mental Status: He is alert.     GCS: GCS eye subscore is 4. GCS verbal subscore is 5. GCS motor subscore is 6.  Psychiatric:        Speech: Speech normal.         Behavior: Behavior normal.        Thought Content: Thought content normal.      ED Treatments / Results  Labs (all labs ordered are listed, but only abnormal results are displayed) Labs Reviewed  BASIC METABOLIC PANEL - Abnormal; Notable for the following components:      Result Value   Sodium 134 (*)    Chloride 96 (*)    Glucose, Bld 137 (*)    Creatinine, Ser 1.44 (*)    GFR calc non Af Amer 56 (*)    All other components within normal limits  CBC - Abnormal; Notable for the following components:   WBC 12.2 (*)    All other components within normal limits  CULTURE, BLOOD (ROUTINE X 2)  CULTURE, BLOOD (ROUTINE X 2)  LACTIC ACID, PLASMA  LACTIC ACID, PLASMA  I-STAT TROPONIN, ED    EKG EKG Interpretation  Date/Time:  Wednesday June 25 2018 08:55:29 EST Ventricular Rate:  124 PR Interval:    QRS Duration: 87 QT Interval:  326 QTC Calculation: 465 R Axis:   55 Text Interpretation:  Sinus tachycardia No significant change since last tracing Confirmed by Alvira MondaySchlossman, Erin ([id number]) on 06/25/2018 9:10:50 AM Also confirmed by Alvira MondaySchlossman, Erin ([id number]), editor Josephine IgoBelcher, Jessica [phone])  on 06/25/2018 10:22:53 AM   Radiology Dg Chest 2 View  Result Date: 06/25/2018 CLINICAL DATA:  Shortness of breath and cough EXAM: CHEST - 2 VIEW COMPARISON:  June 17, 2016. FINDINGS: There is a small left apical pneumothorax without tension component. There is airspace consolidation in the posterior left base region. There is mild bibasilar atelectasis as well. The lungs elsewhere are clear. Heart size and pulmonary vascularity are normal. No adenopathy. No bone lesions appreciable. IMPRESSION: 1.  Small left apical pneumothorax without tension component. 2. Airspace consolidation consistent with pneumonia posterior left base. 3.  Bibasilar atelectasis. 4.  Heart size normal. Critical Value/emergent results were called by telephone at the time of interpretation on 06/25/2018  at 9:56 am to Dr. Gardner Candle. ED physician, Who verbally acknowledged these results. Electronically Signed   By: Bretta Bang III M.D.   On: 06/25/2018 09:56   Ct Angio Chest Pe W And/or Wo Contrast  Result Date: 06/25/2018 CLINICAL DATA:  Shortness of breath and chest pain. Recent paraesophageal hernia repair EXAM: CT ANGIOGRAPHY CHEST WITH CONTRAST TECHNIQUE: Multidetector CT imaging of the chest was performed using the standard protocol during bolus administration of intravenous contrast. Multiplanar CT image reconstructions and MIPs were obtained to evaluate the vascular anatomy. CONTRAST:  ISOVUE-370 IOPAMIDOL (ISOVUE-370) INJECTION 76% COMPARISON:  Chest radiograph June 25, 2018 FINDINGS: Cardiovascular: There is no demonstrable pulmonary embolus. There is no thoracic aortic aneurysm or dissection. The visualized great vessels appear unremarkable. There is no pericardial effusion or pericardial thickening. Mediastinum/Nodes: Visualized thyroid appears normal. There is no appreciable thoracic adenopathy. No esophageal lesions are evident. Lungs/Pleura: There is a moderate pleural effusion on the left which appears partially loculated. There is airspace consolidation consistent with pneumonia in portions of the left lower lobe, primarily involving superior, medial, and posterior segments. There is also patchy infiltrate in portions of the posterolateral segments of the right lower lobe. There is atelectatic change in the inferior aspect of the right middle lobe. There is a small pneumothorax on the left without tension component. This pneumothorax is seen in the apical region. There is also a small amount of loculated air posteromedially on the left. There is no appreciable pneumomediastinum. Upper Abdomen: There are apparent pledgets the gastric cardia consistent with recent surgery. Visualized upper abdominal structures otherwise appear normal. Musculoskeletal: No blastic or lytic bone lesions. No fracture or  dislocation evident. No chest wall lesions are appreciable. Review of the MIP images confirms the above findings. IMPRESSION: 1. No demonstrable pulmonary embolus. No thoracic aortic aneurysm or dissection. 2. Small pneumothorax on the left without tension component, similar to chest radiograph obtained earlier in the day. 3. Consolidation consistent with pneumonia throughout much of the left lower lobe with associated partially loculated pleural effusion on the left. Smaller area of apparent pneumonia involving portions of the posterolateral segments of the right lower lobe. 4.  No appreciable thoracic adenopathy. Electronically Signed   By: Bretta Bang III M.D.   On: 06/25/2018 12:45    Procedures Procedures (including critical care time)  Medications Ordered in ED Medications  iopamidol (ISOVUE-370) 76 % injection (has no administration in time range)  ceFEPIme (MAXIPIME) 2 g in sodium chloride 0.9 % 100 mL IVPB (has no administration in time range)  vancomycin (VANCOCIN) 1,500 mg in sodium chloride 0.9 % 500 mL IVPB (has no administration in time range)  ketorolac (TORADOL) 15 MG/ML injection 15 mg (has no administration in time range)  sodium chloride 0.9 % bolus 1,000 mL (0 mLs Intravenous Stopped 06/25/18 1153)  fentaNYL (SUBLIMAZE) injection 100 mcg (100 mcg Intravenous Given 06/25/18 1109)  ceFEPIme (MAXIPIME) 2 g in sodium chloride 0.9 % 100 mL IVPB (0 g Intravenous Stopped 06/25/18 1143)  vancomycin (VANCOCIN) 1,500 mg in sodium chloride 0.9 % 500 mL IVPB (0 mg Intravenous Stopped 06/25/18 1358)  HYDROmorphone (DILAUDID) injection 1 mg (1 mg Intravenous Given 06/25/18 1202)  iopamidol (ISOVUE-370) 76 % injection 100 mL (100 mLs Intravenous Contrast Given 06/25/18 1225)  HYDROmorphone (DILAUDID) injection 1 mg (1 mg Intravenous Given 06/25/18 1356)     Initial Impression / Assessment and Plan / ED Course  I have reviewed the triage vital signs and the nursing notes.  Pertinent labs &  imaging results that were available during my care of the patient were reviewed by me and considered in my medical decision making (see chart for details).  Clinical Course as of Jun 25 1421  Wed Jun 25, 2018  1042 Pulse Rate(!): 120 [CG]  1042 Resp(!): 24 [CG]  1042 Creatinine(!): 1.44 [CG]  1042 WBC(!): 12.2 [CG]    Clinical Course User Index [CG] Liberty Handy, PA-C    Ddx includes post surgical complication like gas/bloat related discomfort, leak or perforation.  Considering ACS although less likely given recent surgery and HEART score <3.  PE, PNA, PTX, pericarditis, myocarditis, pleurisy also on differential.  He is tachycardic and tachypnic upon arrival.  D-dimer, sed rate/CRP unreliable since recent surgery. Will obtain labs, trop, EKG, CXR. Anticipate need for CT.   1100: Mild leukocytosis. Creatinine 1.44. CXR shows LLL PNA and apical PTX. No ischemic or pericarditis changes on EKG, trop negative.  Abx, IVF, fentanyl ordered. CTA pending. Family updated.   1350: CTA negative for PE confirms LLL PNA and small PTX w/o tension.  Pain has been refractory in ER despite fentanyl, dilaudid.  Tachypnic. SpO2 in low 90s, he is on [place]. Will speak to medicine team to admit for HAP with pleural effusion and PTX.  Pending general surgery consult.   1422: Spoke to [place] with general surgery, general surgery team aware of pt. Admitted by Dr Willette Pa.  Final Clinical Impressions(s) / ED Diagnoses   Final diagnoses:  Healthcare-associated pneumonia  Postprocedural pneumothorax  Pleural effusion, left    ED Discharge Orders    None       Liberty Handy, PA-C 06/25/18 1424    Alvira Monday, MD 06/27/18 2215

## 2018-06-25 NOTE — ED Notes (Signed)
Pt placed on 3L [place] for spo2 in lower 90s and effort of breathing.

## 2018-06-25 NOTE — H&P (Signed)
History and Physical    Jeremy DurhamBrian T Stitely ZOX:[id number] DOB: [birth year] DOA: 06/25/2018  PCP: Maurice SmallGriffin, Elaine, MD  Patient coming from: Home  I have personally briefly reviewed patient's old medical records in [facility]  Chief Complaint: Left-sided chest pain and shortness of breath  HPI: Jeremy Diaz is a 53 y.o. male with medical history significant of hyperlipidemia, hypertension hiatal hernia with severe gastroesophageal reflux disease status post Nissen fundoplication repair on June 20, 2018.  He presents for evaluation of left-sided chest pain and shortness of breath.  Pain is sharp it is 8 out of 10 it is dull and present constantly.  It significantly worsens with any movement, burping, or deep inspiration.  Pain radiates to his left shoulder, left axilla and left back.  Pain has been present since he woke up from surgery.  Pain is slightly alleviated if he stays still or if he sits leaning forward.  It was actually noted upon waking up from surgery however initially it was mostly in his left shoulder since then it has worsened and this morning he woke up with severe pain and was unable to move.  He spoke to his surgeon about the pain was told it was likely related to gas.  He has been burping and having bowel movements but there has been no improvement in the pain.  An esophagram was done the day after surgery and was normal.  He has been taking gabapentin 300 mg 3 times daily and oxycodone as prescribed by his surgeon without relief.  He has associated shortness of breath which is feeling like a restricted ability to take a deep breath.  He has associated pain on inspiration. He has no history of coronary disease, no tobacco use no family history of coronary disease, no personal history of DVT or PE, no recent prolonged travel, he has been somewhat immobilized because he has not been getting up and around much since the surgery due to severe pain.  He does not have any lower extremity edema  or calf pain.  He was in the hospital one night after surgery but has been somewhat sedentary since then.  He has had no fevers chills or cough.  ED Course: Chest x-ray showed a pneumothorax and pneumonia a CT scan of the chest was obtained which confirmed this.  White blood cell count is 12.2.  He was tachycardic and tachypneic and his sats dropped into the low 90s.  Even pneumonia he was started on vancomycin and cefepime for facility acquired pneumonia.  He was treated initially with fentanyl without much relief and has received several doses of Dilaudid with minimal relief of his pain.  Been referred to [facility] hospitalist for further evaluation and management.  Review of Systems: As per HPI otherwise all other systems reviewed and  negative.    Past Medical History:  Diagnosis Date  . Anxiety   . Complication of anesthesia    Difficult to wake up after anesthesia per wife  . Depression   . GERD (gastroesophageal reflux disease) 03-27-11   tx. Nexium  . Headache   . Hiatal hernia s/p robotic repair & Nissen fundoplication 06/20/2018 06/20/2018  . History of hiatal hernia   . History of inguinal hernia repair    BIH  . History of Nissen fundoplication 06/20/2018 06/20/2018  . Hyperlipidemia   . Hypertension   . Seasonal allergies   . Squamous cell carcinoma in situ (SCCIS) of skin of face     Past Surgical  History:  Procedure Laterality Date  . ESOPHAGEAL MANOMETRY N/A 07/10/2017   Procedure: ESOPHAGEAL MANOMETRY (EM);  Surgeon: Jeani Hawking, MD;  Location: [facility];  Service: Endoscopy;  Laterality: N/A;  . HERNIA REPAIR  06/21/2018  . INGUINAL HERNIA REPAIR  04/02/2011   Procedure: LAPAROSCOPIC BILATERAL INGUINAL HERNIA REPAIR;  Surgeon: Adolph Pollack, MD;  Location: [facility];  Service: General;  Laterality: Bilateral;  laparoscopic repair bilateral inguinal hernia  . KNEE ARTHROSCOPY Left 10/12/2015   Procedure: LEFT ARTHROSCOPY KNEE WITH MENISCAL DEBRIDEMENT;  Surgeon: Ollen Gross, MD;  Location: [facility];  Service: Orthopedics;  Laterality: Left;  . KNEE SURGERY  2009   right  . NOSE SURGERY  200/2003/2008/2012  . PH IMPEDANCE STUDY N/A 07/10/2017   Procedure: PH IMPEDANCE STUDY;  Surgeon: Jeani Hawking, MD;  Location: [facility];  Service: Endoscopy;  Laterality: N/A;  . SHOULDER SURGERY  2004   left  . VARICOCELE EXCISION  2000  . WRIST SURGERY Left 2013  . XI ROBOTIC ASSISTED PARASTOMAL HERNIA REPAIR N/A 06/20/2018   Procedure: XI ROBOTIC REPAIR OF PARAESOPHAGEAL  HIATAL HERNIA WITH FUNDOPLICATION, WITH MESH;  Surgeon: Karie Soda, MD;  Location: [facility];  Service: General;  Laterality: N/A;    Social History   Social History Narrative  . Not on file     reports that he has never smoked. He has never used smokeless tobacco. He reports that he does not drink alcohol or use drugs.  Allergies  Allergen Reactions  . Eggs Or Egg-Derived Products Anaphylaxis  . Amoxil [Amoxicillin Trihydrate] Itching and Other (See Comments)    Has patient had a PCN reaction causing immediate rash, facial/tongue/throat swelling, SOB or lightheadedness with hypotension: no Has patient had a PCN reaction causing severe rash involving mucus membranes or skin necrosis: no Has patient had a PCN reaction that required hospitalization no Has patient had a PCN reaction occurring within the last 10 years: yes If all of the above answers are "NO", then may proceed with Cephalosporin use.  . Banana Itching  . Skelaxin Itching    History reviewed. No pertinent family history. Family history personally reviewed and was not pertinent.  Prior to Admission medications   Medication Sig Start Date End Date Taking? Authorizing Provider  AMBULATORY NON FORMULARY MEDICATION Inject into the muscle 2 (two) times a week. Medication Name:allergy shots    [provider]  buPROPion (WELLBUTRIN XL) 150 MG 24 hr tablet Take 150 mg by mouth daily.    [provider]    EPINEPHrine (EPIPEN 2-PAK) 0.3 mg/0.3 mL IJ SOAJ injection Inject 0.3 mg into the muscle once as needed (For anaphylaxis.).  04/30/13   [provider]  lisinopril-hydrochlorothiazide (PRINZIDE,ZESTORETIC) 10-12.5 MG tablet Take 1 tablet by mouth daily.    [provider]  ondansetron (ZOFRAN) 4 MG tablet Take 1 tablet (4 mg total) by mouth every 8 (eight) hours as needed for nausea. 06/20/18   Karie Soda, MD  pantoprazole (PROTONIX) 40 MG tablet Take 40 mg by mouth 2 (two) times daily.    [provider]  pravastatin (PRAVACHOL) 40 MG tablet Take 40 mg by mouth daily.    [provider]  promethazine (PHENERGAN) 25 MG suppository Place 1 suppository (25 mg total) rectally every 6 (six) hours as needed for nausea. 06/20/18   Karie Soda, MD  traMADol (ULTRAM) 50 MG tablet Take 1-2 tablets (50-100 mg total) by mouth every 6 (six) hours as needed for moderate pain or severe pain.  06/20/18   Karie Soda, MD    Physical Exam:  Constitutional: Ill-appearing white male obviously uncomfortable in mild respiratory discomfort Vitals:   06/25/18 1315 06/25/18 1345 06/25/18 1400 06/25/18 1415  BP: 104/63 103/76 103/73 103/74  Pulse: 76 93 76 88  Resp: 16 (!) 21 20 18   Temp:      TempSrc:      SpO2: 98% 98% 97% 97%  Weight:      Height:       Eyes: PERRL, lids and conjunctivae normal ENMT: Mucous membranes are moist. Posterior pharynx clear of any exudate or lesions.Normal dentition.  Neck: normal, supple, no masses, no thyromegaly Respiratory: Coarse breath sounds bilaterally with rhonchi on the left greater than the right.  No wheezing no crackles.  Increased respiratory effort.  Minimal use of accessory muscles. Cardiovascular: Regular rate and rhythm, no murmurs / rubs / gallops. No extremity edema. 2+ pedal pulses. No carotid bruits.  Abdomen: no tenderness, no masses palpated. No hepatosplenomegaly. Bowel sounds positive.  Musculoskeletal: no clubbing  / cyanosis. No joint deformity upper and lower extremities. Good ROM, no contractures. Normal muscle tone.  Skin: no rashes, lesions, ulcers. No induration Neurologic: CN 2-12 grossly intact. Sensation intact, DTR normal. Strength 5/5 in all 4.  Psychiatric: Normal judgment and insight. Alert and oriented x 3. Normal mood.    Labs on Admission: I have personally reviewed following labs and imaging studies  CBC: Recent Labs  Lab 06/25/18 0911  WBC 12.2*  HGB 14.9  HCT 43.5  MCV 93.5  PLT 322   Basic Metabolic Panel: Recent Labs  Lab 06/25/18 0911  NA 134*  K 4.2  CL 96*  CO2 25  GLUCOSE 137*  BUN 16  CREATININE 1.44*  CALCIUM 9.4   GFR: Estimated Creatinine Clearance: 56.7 mL/min (A) (by C-G formula based on SCr of 1.44 mg/dL (H)). Urine analysis:    Component Value Date/Time   COLORURINE YELLOW 06/09/2017 0931   APPEARANCEUR CLEAR 06/09/2017 0931   LABSPEC 1.017 06/09/2017 0931   PHURINE 5.0 06/09/2017 0931   GLUCOSEU NEGATIVE 06/09/2017 0931   HGBUR SMALL (A) 06/09/2017 0931   BILIRUBINUR NEGATIVE 06/09/2017 0931   KETONESUR NEGATIVE 06/09/2017 0931   PROTEINUR NEGATIVE 06/09/2017 0931   UROBILINOGEN 0.2 04/14/2011 1048   NITRITE NEGATIVE 06/09/2017 0931   LEUKOCYTESUR NEGATIVE 06/09/2017 0931    Radiological Exams on Admission: Dg Chest 2 View  Result Date: 06/25/2018 CLINICAL DATA:  Shortness of breath and cough EXAM: CHEST - 2 VIEW COMPARISON:  June 17, 2016. FINDINGS: There is a small left apical pneumothorax without tension component. There is airspace consolidation in the posterior left base region. There is mild bibasilar atelectasis as well. The lungs elsewhere are clear. Heart size and pulmonary vascularity are normal. No adenopathy. No bone lesions appreciable. IMPRESSION: 1.  Small left apical pneumothorax without tension component. 2. Airspace consolidation consistent with pneumonia posterior left base. 3.  Bibasilar atelectasis. 4.  Heart size  normal. Critical Value/emergent results were called by telephone at the time of interpretation on 06/25/2018 at 9:56 am to Dr. Gardner Candle. ED physician, Who verbally acknowledged these results. Electronically Signed   By: Bretta Bang III M.D.   On: 06/25/2018 09:56   Ct Angio Chest Pe W And/or Wo Contrast  Result Date: 06/25/2018 CLINICAL DATA:  Shortness of breath and chest pain. Recent paraesophageal hernia repair EXAM: CT ANGIOGRAPHY CHEST WITH CONTRAST TECHNIQUE: Multidetector CT imaging of the chest was performed using the standard protocol during  bolus administration of intravenous contrast. Multiplanar CT image reconstructions and MIPs were obtained to evaluate the vascular anatomy. CONTRAST:  ISOVUE-370 IOPAMIDOL (ISOVUE-370) INJECTION 76% COMPARISON:  Chest radiograph June 25, 2018 FINDINGS: Cardiovascular: There is no demonstrable pulmonary embolus. There is no thoracic aortic aneurysm or dissection. The visualized great vessels appear unremarkable. There is no pericardial effusion or pericardial thickening. Mediastinum/Nodes: Visualized thyroid appears normal. There is no appreciable thoracic adenopathy. No esophageal lesions are evident. Lungs/Pleura: There is a moderate pleural effusion on the left which appears partially loculated. There is airspace consolidation consistent with pneumonia in portions of the left lower lobe, primarily involving superior, medial, and posterior segments. There is also patchy infiltrate in portions of the posterolateral segments of the right lower lobe. There is atelectatic change in the inferior aspect of the right middle lobe. There is a small pneumothorax on the left without tension component. This pneumothorax is seen in the apical region. There is also a small amount of loculated air posteromedially on the left. There is no appreciable pneumomediastinum. Upper Abdomen: There are apparent pledgets the gastric cardia consistent with recent surgery.  Visualized upper abdominal structures otherwise appear normal. Musculoskeletal: No blastic or lytic bone lesions. No fracture or dislocation evident. No chest wall lesions are appreciable. Review of the MIP images confirms the above findings. IMPRESSION: 1. No demonstrable pulmonary embolus. No thoracic aortic aneurysm or dissection. 2. Small pneumothorax on the left without tension component, similar to chest radiograph obtained earlier in the day. 3. Consolidation consistent with pneumonia throughout much of the left lower lobe with associated partially loculated pleural effusion on the left. Smaller area of apparent pneumonia involving portions of the posterolateral segments of the right lower lobe. 4.  No appreciable thoracic adenopathy. Electronically Signed   By: Bretta Bang III M.D.   On: 06/25/2018 12:45    EKG: Independently reviewed.  Sinus tachycardia otherwise no change when compared to 06/17/2018.  Assessment/Plan Principal Problem:   Left lower lobe pneumonia (HCC) Active Problems:   Pneumothorax on left   Loculated pleural effusion   Hiatal hernia s/p robotic repair & Nissen fundoplication 06/20/2018   Acute respiratory insufficiency   Essential hypertension   Hyperlipidemia   1.  Left lower lobe pneumonia: Patient to be started on vancomycin and cefepime for treatment of likely hospital-acquired pneumonia and recent surgery.  Will follow chest x-ray closely.  2.  Pneumothorax on the left: We will monitor closely does not appear to be large enough to require chest tube per ED practitioner.  Will check repeat chest x-ray in a.m.  3.  Loculated pleural effusion: We will monitor.  If enlarges may require interventional radiology drainage.  At this point will start antibiotics and monitor response.  4.  Hiatal hernia status post robotic repair and Nissen fundoplication on 06/20/2018: Dr. gross has been notified request patient be admitted to [facility] as he is  there.  5.  Acute respiratory insufficiency: O2 sats dropped into the low 90s patient requiring 1-1/2 L of oxygen.  Will humidify for comfort.  6.  Essential hypertension: Blood pressures currently stable.  Awaiting pharmacy to complete medication review and will order home meds when able.  7.  Hyperlipidemia: We will continue home medication management.  8.  Incomplete database: Awaiting pharmacy review of home medications prior to reconciling home meds.   DVT prophylaxis: Subcu heparin Code Status: Full code Family Communication: Spoke with patient's wife and his son who are present at the bedside.  Patient's son is an EMT. Disposition Plan: Likely home in 3 to 4 days Consults called: Dr. gross from general surgery Admission status: Inpatient   Lahoma Crocker MD FACP [facility] Pager [phone]  How to contact the [facility] Attending or Consulting provider 7A - 7P or covering provider during after hours 7P -7A, for this patient?  1. Check the care team in [facility] and look for a) attending/consulting [facility] provider listed and b) the [facility] team listed 2. Log into [email or web address] and use [place]'s universal password to access. If you do not have the password, please contact the hospital operator. 3. Locate the [facility] provider you are looking for under [facility] and page to a number that you can be directly reached. 4. If you still have difficulty reaching the provider, please page the [facility] (Director on Call) for the Hospitalists listed on amion for assistance.  If 7PM-7AM, please contact night-coverage [email or web address] Password [email or web address]  06/25/2018, 3:13 PM

## 2018-06-25 NOTE — ED Notes (Signed)
Pt states that pain in left chest is worse again, Claudia PA notified and in room to see pt.

## 2018-06-26 ENCOUNTER — Inpatient Hospital Stay (HOSPITAL_COMMUNITY): Payer: BLUE CROSS/BLUE SHIELD

## 2018-06-26 DIAGNOSIS — G894 Chronic pain syndrome: Secondary | ICD-10-CM

## 2018-06-26 LAB — BASIC METABOLIC PANEL
Anion gap: 5 (ref 5–15)
BUN: 15 mg/dL (ref 6–20)
CO2: 23 mmol/L (ref 22–32)
Calcium: 8.2 mg/dL — ABNORMAL LOW (ref 8.9–10.3)
Chloride: 108 mmol/L (ref 98–111)
Creatinine, Ser: 1.06 mg/dL (ref 0.61–1.24)
GFR calc Af Amer: >60 mL/min (ref >60)
GFR calc non Af Amer: >60 mL/min (ref >60)
Glucose, Bld: 98 mg/dL (ref 70–99)
Potassium: 4.6 mmol/L (ref 3.5–5.1)
Sodium: 136 mmol/L (ref 135–145)

## 2018-06-26 LAB — RESPIRATORY PANEL BY PCR
Adenovirus: NOT DETECTED
BORDETELLA PERTUSSIS-RVPCR: NOT DETECTED
Chlamydophila pneumoniae: NOT DETECTED
Coronavirus 229E: NOT DETECTED
Coronavirus HKU1: NOT DETECTED
Coronavirus NL63: NOT DETECTED
Coronavirus OC43: NOT DETECTED
Influenza A: NOT DETECTED
Influenza B: NOT DETECTED
Metapneumovirus: NOT DETECTED
Mycoplasma pneumoniae: NOT DETECTED
PARAINFLUENZA VIRUS 2-RVPPCR: NOT DETECTED
Parainfluenza Virus 1: NOT DETECTED
Parainfluenza Virus 3: NOT DETECTED
Parainfluenza Virus 4: NOT DETECTED
Respiratory Syncytial Virus: NOT DETECTED
Rhinovirus / Enterovirus: NOT DETECTED

## 2018-06-26 LAB — CBC
HEMATOCRIT: 37.6 % — AB (ref 39.0–52.0)
Hemoglobin: 12.2 g/dL — ABNORMAL LOW (ref 13.0–17.0)
MCH: 32.2 pg (ref 26.0–34.0)
MCHC: 32.4 g/dL (ref 30.0–36.0)
MCV: 99.2 fL (ref 80.0–100.0)
Platelets: 246 10*3/uL (ref 150–400)
RBC: 3.79 MIL/uL — ABNORMAL LOW (ref 4.22–5.81)
RDW: 11.7 % (ref 11.5–15.5)
WBC: 7.2 10*3/uL (ref 4.0–10.5)
nRBC: 0 % (ref 0.0–0.2)

## 2018-06-26 LAB — HIV ANTIBODY (ROUTINE TESTING W REFLEX): HIV Screen 4th Generation wRfx: NONREACTIVE

## 2018-06-26 LAB — MRSA PCR SCREENING: MRSA by PCR: NEGATIVE

## 2018-06-26 MED ORDER — GABAPENTIN 300 MG PO CAPS: 300.0000 mg | ORAL_CAPSULE | Freq: Three times a day (TID) | ORAL | Status: DC | PRN

## 2018-06-26 MED ORDER — ATORVASTATIN CALCIUM 40 MG PO TABS
40.0000 mg | ORAL_TABLET | Freq: Every day | ORAL | Status: DC
  Administered 2018-06-26 – 2018-06-27 (×2): 40 mg via ORAL
  Filled 2018-06-26 (×2): qty 1

## 2018-06-26 MED ORDER — ACETAMINOPHEN 500 MG PO TABS
1000.0000 mg | ORAL_TABLET | Freq: Three times a day (TID) | ORAL | Status: DC
  Administered 2018-06-26 – 2018-06-27 (×4): 1000 mg via ORAL
  Filled 2018-06-26 (×4): qty 2

## 2018-06-26 MED ORDER — GABAPENTIN 300 MG PO CAPS: 300.0000 mg | ORAL_CAPSULE | Freq: Three times a day (TID) | ORAL | Status: DC

## 2018-06-26 MED ORDER — PANTOPRAZOLE SODIUM 40 MG PO TBEC
40.0000 mg | DELAYED_RELEASE_TABLET | Freq: Two times a day (BID) | ORAL | Status: DC
  Administered 2018-06-26 – 2018-06-27 (×3): 40 mg via ORAL
  Filled 2018-06-26 (×3): qty 1

## 2018-06-26 MED ORDER — BUPROPION HCL ER (XL) 150 MG PO TB24
150.0000 mg | ORAL_TABLET | Freq: Every day | ORAL | Status: DC
  Administered 2018-06-26 – 2018-06-27 (×2): 150 mg via ORAL
  Filled 2018-06-26 (×2): qty 1

## 2018-06-26 MED ORDER — LISINOPRIL 10 MG PO TABS
10.0000 mg | ORAL_TABLET | Freq: Every day | ORAL | Status: DC
  Administered 2018-06-26 – 2018-06-27 (×2): 10 mg via ORAL
  Filled 2018-06-26 (×3): qty 1

## 2018-06-26 MED ORDER — GABAPENTIN 300 MG PO CAPS
600.0000 mg | ORAL_CAPSULE | Freq: Three times a day (TID) | ORAL | Status: DC
  Administered 2018-06-26 – 2018-06-27 (×4): 600 mg via ORAL
  Filled 2018-06-26 (×4): qty 2

## 2018-06-26 MED ORDER — SODIUM CHLORIDE 0.9 % IV SOLN
INTRAVENOUS | Status: DC
  Administered 2018-06-26 – 2018-06-27 (×2): via INTRAVENOUS

## 2018-06-26 MED ORDER — HYDROCHLOROTHIAZIDE 12.5 MG PO CAPS
12.5000 mg | ORAL_CAPSULE | Freq: Every day | ORAL | Status: DC
  Administered 2018-06-26 – 2018-06-27 (×2): 12.5 mg via ORAL
  Filled 2018-06-26 (×2): qty 1

## 2018-06-26 NOTE — Progress Notes (Signed)
Assessment was done/charted prior to arrival. My assessment perfformed is agreed with the charted (previous) assessment.  

## 2018-06-26 NOTE — Progress Notes (Addendum)
PROGRESS NOTE  Jeremy DurhamBrian T Pavlik WUJ:[id number] DOB: 1965-10-02 DOA: 06/25/2018 PCP: Maurice SmallGriffin, Elaine, MD  HPI/Recap of past 24 hours: Jeremy DurhamBrian T Diaz is a 53 y.o. male with medical history significant of hyperlipidemia, hypertension hiatal hernia with severe gastroesophageal reflux disease status post Nissen fundoplication repair on June 20, 2018.  He presents for evaluation of left-sided chest pain and shortness of breath.  Pain is sharp it is 8 out of 10 it is dull and present constantly.  It significantly worsens with any movement, burping, or deep inspiration.  Pain radiates to his left shoulder, left axilla and left back.  Pain has been present since he woke up from surgery.  Pain is slightly alleviated if he stays still or if he sits leaning forward.  It was actually noted upon waking up from surgery however initially it was mostly in his left shoulder since then it has worsened and this morning he woke up with severe pain and was unable to move.  He spoke to his surgeon about the pain was told it was likely related to gas.  He has been burping and having bowel movements but there has been no improvement in the pain.  An esophagram was done the day after surgery and was normal.  He has been taking gabapentin 300 mg 3 times daily and oxycodone as prescribed by his surgeon without relief.  He has associated shortness of breath which is feeling like a restricted ability to take a deep breath.  He has associated pain on inspiration. He has no history of coronary disease, no tobacco use no family history of coronary disease, no personal history of DVT or PE, no recent prolonged travel, he has been somewhat immobilized because he has not been getting up and around much since the surgery due to severe pain.  He does not have any lower extremity edema or calf pain.  He was in the hospital one night after surgery but has been somewhat sedentary since then.  He has had no fevers chills or cough.  Chest x-ray showed  a pneumothorax and pneumonia a CT scan of the chest was obtained which confirmed this.  White blood cell count is 12.2.  He was tachycardic and tachypneic and his sats dropped into the low 90s.  Even pneumonia he was started on vancomycin and cefepime for facility acquired pneumonia.    06/26/2018: Patient seen and examined with his son and wife at bedside.  No acute events overnight.  Pain is well controlled on current pain management.  He had a repeat chest x-ray done today to reassess left apical pneumothorax. No new abnormalities and no change in the small left apical pneumothorax.  Vital signs are stable this morning.  General surgery following.   Assessment/Plan: Principal Problem:   Left lower lobe pneumonia (HCC) Active Problems:   Anxiety state   Essential hypertension   Hiatal hernia s/p robotic repair & Nissen fundoplication 06/20/2018   Hyperlipidemia   History of Nissen fundoplication 06/20/2018   Pneumothorax on left   Loculated pleural effusion   Acute respiratory insufficiency   Chronic pain syndrome  Left lower lobe HCAP: MRSA screening negative, DC IV vancomycin Continue cefepime Independently reviewed chest x-ray and CT done on admission which revealed left lower lobe infiltrates as well as partially loculated left-sided pleural effusion  Maintain O2 saturation greater than 92% Leukocytosis is resolving from 12,000 to 7000 on IV antibiotics Repeat CBC in the morning  Left pneumothorax:  Independent review CT chest no  sign of tension pneumothorax General surgery has been consulted and following We will continue to monitor Repeat chest x-ray done this morning revealed no changes   Partially loculated left pleural effusion Continue IV antibiotics Maintain O2 saturation greater than 92%  Hiatal hernia status post robotic repair and Nissen fundoplication on 06/20/2018: Continue pured diet No acute issues  Resolving AKI Baseline creatinine appears to be 1.0 with  GFR greater than 60 Presented with creatinine of 1.44 Creatinine today 1.06 Continue to avoid nephrotoxic agents Continue IV fluid hydration Reduce rate of normal saline to 75 cc/h from 125 cc/h Repeat BMP in the morning  Essential hypertension: Blood pressures currently stable.  Awaiting pharmacy to complete medication review and will order home meds when able.  Hyperlipidemia: We will continue home medication management.    DVT prophylaxis: Subcu heparin which he declines, start SCDs Code Status: Full code Family Communication:  Updated patient's wife at bedside.  All questions answered to her satisfaction.   Disposition Plan:  Likely home in 1 to 2 days or when general surgery signs off. Consults called: Dr. gross from general surgery   Objective: Vitals:   06/26/18 0905 06/26/18 1136 06/26/18 1350 06/26/18 1443  BP: 120/75 107/71 113/73 110/74  Pulse: (!) 56  82 70  Resp:   16 12  Temp: 98.1 F (36.7 C)  98 F (36.7 C) 98.1 F (36.7 C)  TempSrc: Oral  Oral Oral  SpO2: 97%  99% 97%  Weight:      Height:        Intake/Output Summary (Last 24 hours) at 06/26/2018 1639 Last data filed at 06/26/2018 1533 Gross per 24 hour  Intake 1993.6 ml  Output 325 ml  Net 1668.6 ml   Filed Weights   06/25/18 0859  Weight: 74.8 kg    Exam:  . General: 53 y.o. year-old male well developed well nourished in no acute distress.  Alert and oriented x3. . Cardiovascular: Regular rate and rhythm with no rubs or gallops.  No thyromegaly or JVD noted.   Marland Kitchen Respiratory: Mild rales at bases with no wheezes. Good inspiratory effort. . Abdomen: Soft nontender nondistended with normal bowel sounds x4 quadrants. . Musculoskeletal: No lower extremity edema. 2/4 pulses in all 4 extremities. Marland Kitchen Psychiatry: Mood is appropriate for condition and setting   Data Reviewed: CBC: Recent Labs  Lab 06/25/18 0911 06/26/18 0419  WBC 12.2* 7.2  HGB 14.9 12.2*  HCT 43.5 37.6*  MCV 93.5 99.2  PLT  322 246   Basic Metabolic Panel: Recent Labs  Lab 06/25/18 0911 06/26/18 0419  NA 134* 136  K 4.2 4.6  CL 96* 108  CO2 25 23  GLUCOSE 137* 98  BUN 16 15  CREATININE 1.44* 1.06  CALCIUM 9.4 8.2*   GFR: Estimated Creatinine Clearance: 77.1 mL/min (by C-G formula based on SCr of 1.06 mg/dL). Liver Function Tests: No results for input(s): AST, ALT, ALKPHOS, BILITOT, PROT, ALBUMIN in the last 168 hours. No results for input(s): LIPASE, AMYLASE in the last 168 hours. No results for input(s): AMMONIA in the last 168 hours. Coagulation Profile: No results for input(s): INR, PROTIME in the last 168 hours. Cardiac Enzymes: No results for input(s): CKTOTAL, CKMB, CKMBINDEX, TROPONINI in the last 168 hours. BNP (last 3 results) No results for input(s): PROBNP in the last 8760 hours. HbA1C: No results for input(s): HGBA1C in the last 72 hours. CBG: No results for input(s): GLUCAP in the last 168 hours. Lipid Profile: No results for  input(s): CHOL, HDL, LDLCALC, TRIG, CHOLHDL, LDLDIRECT in the last 72 hours. Thyroid Function Tests: No results for input(s): TSH, T4TOTAL, FREET4, T3FREE, THYROIDAB in the last 72 hours. Anemia Panel: No results for input(s): VITAMINB12, FOLATE, FERRITIN, TIBC, IRON, RETICCTPCT in the last 72 hours. Urine analysis:    Component Value Date/Time   COLORURINE YELLOW 06/09/2017 0931   APPEARANCEUR CLEAR 06/09/2017 0931   LABSPEC 1.017 06/09/2017 0931   PHURINE 5.0 06/09/2017 0931   GLUCOSEU NEGATIVE 06/09/2017 0931   HGBUR SMALL (A) 06/09/2017 0931   BILIRUBINUR NEGATIVE 06/09/2017 0931   KETONESUR NEGATIVE 06/09/2017 0931   PROTEINUR NEGATIVE 06/09/2017 0931   UROBILINOGEN 0.2 04/14/2011 1048   NITRITE NEGATIVE 06/09/2017 0931   LEUKOCYTESUR NEGATIVE 06/09/2017 0931   Sepsis Labs: @LABRCNTIP (procalcitonin:4,lacticidven:4)  ) Recent Results (from the past 240 hour(s))  Blood Culture (routine x 2)     Status: None (Preliminary result)    Collection Time: 06/25/18 11:00 AM  Result Value Ref Range Status   Specimen Description BLOOD RIGHT ANTECUBITAL  Final   Special Requests   Final    BOTTLES DRAWN AEROBIC AND ANAEROBIC Blood Culture results may not be optimal due to an excessive volume of blood received in culture bottles   Culture   Final    NO GROWTH < 24 HOURS Performed at [facility], [address], [place], [place] [address]    Report Status PENDING  Incomplete  Blood Culture (routine x 2)     Status: None (Preliminary result)   Collection Time: 06/25/18 11:26 AM  Result Value Ref Range Status   Specimen Description BLOOD SITE NOT SPECIFIED  Final   Special Requests   Final    BOTTLES DRAWN AEROBIC AND ANAEROBIC Blood Culture adequate volume   Culture   Final    NO GROWTH < 24 HOURS Performed at [facility], [address] [place], [place] [address]    Report Status PENDING  Incomplete  Respiratory Panel by PCR     Status: None   Collection Time: 06/25/18  2:41 PM  Result Value Ref Range Status   Adenovirus NOT DETECTED NOT DETECTED Final   Coronavirus 229E NOT DETECTED NOT DETECTED Final    Comment: (NOTE) The Coronavirus on the Respiratory Panel, DOES NOT test for the novel  Coronavirus (2019 nCoV)    Coronavirus HKU1 NOT DETECTED NOT DETECTED Final   Coronavirus NL63 NOT DETECTED NOT DETECTED Final   Coronavirus OC43 NOT DETECTED NOT DETECTED Final   Metapneumovirus NOT DETECTED NOT DETECTED Final   Rhinovirus / Enterovirus NOT DETECTED NOT DETECTED Final   Influenza A NOT DETECTED NOT DETECTED Final   Influenza B NOT DETECTED NOT DETECTED Final   Parainfluenza Virus 1 NOT DETECTED NOT DETECTED Final   Parainfluenza Virus 2 NOT DETECTED NOT DETECTED Final   Parainfluenza Virus 3 NOT DETECTED NOT DETECTED Final   Parainfluenza Virus 4 NOT DETECTED NOT DETECTED Final   Respiratory Syncytial Virus NOT DETECTED NOT DETECTED Final   Bordetella pertussis NOT DETECTED NOT DETECTED Final    Chlamydophila pneumoniae NOT DETECTED NOT DETECTED Final   Mycoplasma pneumoniae NOT DETECTED NOT DETECTED Final    Comment: Performed at [facility], [address] [place], [place] [address]  MRSA PCR Screening     Status: None   Collection Time: 06/26/18  8:31 AM  Result Value Ref Range Status   MRSA by PCR NEGATIVE NEGATIVE Final    Comment:  The GeneXpert MRSA Assay (FDA approved for NASAL specimens only), is one component of a comprehensive MRSA colonization surveillance program. It is not intended to diagnose MRSA infection nor to guide or monitor treatment for MRSA infections. Performed at [facility], [address] [place], [place] [id number]       Studies: Dg Chest 2 View  Result Date: 06/26/2018 CLINICAL DATA:  Patient reports having a pneumonia and a left sided pneumothorax. Hx of hypertension. Patient states he is feeling better. EXAM: CHEST - 2 VIEW COMPARISON:  Chest CT, and chest radiographs, 06/25/2018. FINDINGS: Consolidation at the left lung base is similar to the prior exams. There is linear opacity at the right lung base, also stable, consistent with atelectasis. Remainder of the lungs is clear. Small left apical pneumothorax is without significant change from the prior exam. No pleural effusion. Heart, mediastinum and hila are unremarkable. Skeletal structures are intact. IMPRESSION: 1. No radiographic change in the left lower lobe consolidation or right lung base atelectasis. No change in the small left apical pneumothorax. No new abnormalities. Electronically Signed   By: Amie Portlandavid  Ormond M.D.   On: 06/26/2018 10:24    Scheduled Meds: . acetaminophen  1,000 mg Oral TID  . atorvastatin  40 mg Oral Daily  . buPROPion  150 mg Oral Daily  . docusate sodium  100 mg Oral Daily  . gabapentin  600 mg Oral TID  . heparin  5,000 Units Subcutaneous Q8H  . lisinopril  10 mg Oral Daily   And  . hydrochlorothiazide  12.5 mg Oral Daily  .  ibuprofen  600 mg Oral Q8H  . pantoprazole  40 mg Oral BID    Continuous Infusions: . 0.9 % NaCl with KCl 20 mEq / L 125 mL/hr at 06/26/18 1533  . ceFEPime (MAXIPIME) IV Stopped (06/26/18 1136)  . ondansetron (ZOFRAN) IV       LOS: 1 day     Darlin Droparole N Cameka Rae, MD [facility] Pager [phone]  If 7PM-7AM, please contact night-coverage [email or web address] Password [email or web address] 06/26/2018, 4:39 PM

## 2018-06-26 NOTE — Progress Notes (Signed)
Pt has continued to ambulate around nursing station 3-4 more times. No complaints. Pt refuses to take heparin SQ injections. Dr. Margo Aye paged and made aware.

## 2018-06-26 NOTE — Progress Notes (Signed)
Jeremy Diaz [id number] 17-Jun-1965  CARE TEAM:  PCP: Jeremy Diaz  Outpatient Care Team: Patient Care Team: Jeremy Diaz as PCP - General (Family Medicine) Jeremy SodaGross, Jeremy Lancon, Diaz as Consulting Physician (General Surgery) Jeani HawkingHung, Patrick, Diaz as Consulting Physician (Gastroenterology)  Inpatient Treatment Team: Treatment Team: Attending Provider: Darlin DropHall, Carole N, Diaz; Consulting Physician: Jeremy Diaz; Rounding Team: Jeremy Diaz; Registered Nurse: Jeremy Diaz; Technician: Jeremy Diaz; Consulting Physician: Jeremy Diaz; Registered Nurse: Jeremy Diaz   Problem List:   Principal Problem:   Left lower lobe pneumonia [facility](HCC) Active Problems:   Anxiety state   Essential hypertension   Hiatal hernia s/p robotic repair & Nissen fundoplication 06/20/2018   Hyperlipidemia   History of Nissen fundoplication 06/20/2018   Pneumothorax on left   Loculated pleural effusion   Acute respiratory insufficiency   Chronic pain syndrome  06/20/2018  POST-OPERATIVE DIAGNOSIS:   PARAESOPHAGEAL HIATAL HERNIA   SURGERY:  06/20/2018  Procedure(s): XI ROBOTIC REPAIR OF PARAESOPHAGEAL  HIATAL HERNIA WITH FUNDOPLICATION, WITH MESH  SURGEON:    Surgeon(s): Jeremy Diaz Jeremy Diaz       Assessment  Left shoulder pain and atelectasis versus pneumonia  Small stable pneumothorax.  Acute on chronic pain.  [facility](Hospital Stay = 1 days)  Plan:  -Resume gabapentin.  I would Diaz 600s since that is the higher range of what he normally takes.  Standing Tylenol.  Increase to a gram.  Increase narcotics.  Bowel regimen.  Pneumothorax is very small.  This can happen postoperatively with hiatal hernia repairs.  Should resolve naturally on its own.  No evidence of tension.  Left lower lobe collapse suspicious for possible pneumonia.  I would favor atelectasis, but reasonable to proceed with antibiotics per medicine's assessment.  Dysphasia  1/pured diet per post fundoplication protocol.  Most likely can gradually advance diet in the next week or so to the point of a more regular diet in a month or so -VTE prophylaxis- SCDs, etc -mobilize as tolerated to help recovery  20 minutes spent in review, evaluation, examination, counseling, and coordination of care.  More than 50% of that time was spent in counseling.  06/26/2018    Subjective: (Chief complaint)  Sitting up in chair.  Complains of left shoulder discomfort.  IV narcotics help.  No nausea or vomiting.  Tolerating liquids well.  Son asleep in room  Objective:  Vital signs:  Vitals:   06/25/18 1619 06/25/18 2047 06/26/18 0421 06/26/18 0611  BP: 114/74 97/67 112/81 103/72  Pulse: 68 61 68 68  Resp: 18 16 18 16   Temp: 98.1 F (36.7 C) 98 F (36.7 C) 98 F (36.7 C) 98 F (36.7 C)  TempSrc: Oral Oral Oral Oral  SpO2: 95% 95% 97% 98%  Weight:      Height:           Intake/Output   Yesterday:  02/05 0701 - 02/06 0700 In: 2453.1 [I.V.:853.1; IV Piggyback:1600] Out: 325 [Urine:325] This shift:  No intake/output data recorded.  Bowel function:  Flatus: YES  BM:  No  Drain: (No drain)   Physical Exam:  General: Pt awake/alert/oriented x4 in no acute distress.  Sitting up in chair.  Calm.  Smiling.  Not toxic.  Not sickly. Eyes: PERRL, normal EOM.  Sclera clear.  No icterus Neuro: CN II-XII intact w/o focal sensory/motor deficits. Lymph: No head/neck/groin lymphadenopathy Psych:  No delerium/psychosis/paranoia HENT: Normocephalic, Mucus membranes moist.  No thrush  Neck: Supple, No tracheal deviation Chest: No left-sided chest wall discomfort with rib compression.  No guarding.  Respiratory effort seems normal with w good excursion CV:  Pulses intact.  Regular rhythm MS: Normal AROM mjr joints.  No obvious deformity  Abdomen: Soft.  Nondistended.  Mildly tender at incisions only.  No evidence of peritonitis.  No incarcerated hernias.  Ext:   No deformity.  No mjr edema.  No cyanosis.  Mild vague discomfort of left shoulder but normal active and passive range of motion. Skin: No petechiae / purpura  Results:   Labs: Results for orders placed or performed during the hospital encounter of 06/25/18 (from the past 48 hour(s))  Basic metabolic panel     Status: Abnormal   Collection Time: 06/25/18  9:11 AM  Result Value Ref Range   Sodium 134 (L) 135 - 145 mmol/L   Potassium 4.2 3.5 - 5.1 mmol/L   Chloride 96 (L) 98 - 111 mmol/L   CO2 25 22 - 32 mmol/L   Glucose, Bld 137 (H) 70 - 99 mg/dL   BUN 16 6 - 20 mg/dL   Creatinine, Ser 8.29 (H) 0.61 - 1.24 mg/dL   Calcium 9.4 8.9 - 56.2 mg/dL   GFR calc non Af Amer 56 (L) >60 mL/min   GFR calc Af Amer >60 >60 mL/min   Anion gap 13 5 - 15    Comment: Performed at [facility], [address], [place], [place] [address]  CBC     Status: Abnormal   Collection Time: 06/25/18  9:11 AM  Result Value Ref Range   WBC 12.2 (H) 4.0 - 10.5 K/uL   RBC 4.65 4.22 - 5.81 MIL/uL   Hemoglobin 14.9 13.0 - 17.0 g/dL   HCT 57.8 46.9 - 62.9 %   MCV 93.5 80.0 - 100.0 fL   MCH 32.0 26.0 - 34.0 pg   MCHC 34.3 30.0 - 36.0 g/dL   RDW 52.8 41.3 - 24.4 %   Platelets 322 150 - 400 K/uL   nRBC 0.0 0.0 - 0.2 %    Comment: Performed at [facility], [address], [place], [place] [address]  I-stat troponin, ED     Status: None   Collection Time: 06/25/18  9:21 AM  Result Value Ref Range   Troponin i, poc 0.00 0.00 - 0.08 ng/mL   Comment 3            Comment: Due to the release kinetics of cTnI, a negative result within the first hours of the onset of symptoms does not rule out myocardial infarction with certainty. If myocardial infarction is still suspected, repeat the test at appropriate intervals.   Lactic acid, plasma     Status: None   Collection Time: 06/25/18 11:03 AM  Result Value Ref Range   Lactic Acid, Venous 1.2 0.5 - 1.9 mmol/L    Comment: Performed at [facility], [address] [place], [place] [address]  Respiratory Panel by PCR     Status: None   Collection Time: 06/25/18  2:41 PM  Result Value Ref Range   Adenovirus NOT DETECTED NOT DETECTED   Coronavirus 229E NOT DETECTED NOT DETECTED    Comment: (NOTE) The Coronavirus on the Respiratory Panel, DOES NOT test for the novel  Coronavirus (2019 nCoV)    Coronavirus HKU1 NOT DETECTED NOT DETECTED   Coronavirus NL63 NOT DETECTED NOT DETECTED   Coronavirus OC43 NOT DETECTED NOT DETECTED  Metapneumovirus NOT DETECTED NOT DETECTED   Rhinovirus / Enterovirus NOT DETECTED NOT DETECTED   Influenza A NOT DETECTED NOT DETECTED   Influenza B NOT DETECTED NOT DETECTED   Parainfluenza Virus 1 NOT DETECTED NOT DETECTED   Parainfluenza Virus 2 NOT DETECTED NOT DETECTED   Parainfluenza Virus 3 NOT DETECTED NOT DETECTED   Parainfluenza Virus 4 NOT DETECTED NOT DETECTED   Respiratory Syncytial Virus NOT DETECTED NOT DETECTED   Bordetella pertussis NOT DETECTED NOT DETECTED   Chlamydophila pneumoniae NOT DETECTED NOT DETECTED   Mycoplasma pneumoniae NOT DETECTED NOT DETECTED    Comment: Performed at [facility], [address], [place], [place] [address]  Strep pneumoniae urinary antigen     Status: None   Collection Time: 06/25/18  8:18 PM  Result Value Ref Range   Strep Pneumo Urinary Antigen NEGATIVE NEGATIVE    Comment:        Infection due to S. pneumoniae cannot be absolutely ruled out since the antigen present may be below the detection limit of the test. PERFORMED AT [facility] Performed at [facility], [address] [place], [place] [address]   Basic metabolic panel     Status: Abnormal   Collection Time: 06/26/18  4:19 AM  Result Value Ref Range   Sodium 136 135 - 145 mmol/L   Potassium 4.6 3.5 - 5.1 mmol/L   Chloride 108 98 - 111 mmol/L   CO2 23 22 - 32 mmol/L   Glucose, Bld 98 70 - 99 mg/dL   BUN 15 6 - 20 mg/dL   Creatinine, Ser 1.91 0.61 - 1.24  mg/dL   Calcium 8.2 (L) 8.9 - 10.3 mg/dL   GFR calc non Af Amer >60 >60 mL/min   GFR calc Af Amer >60 >60 mL/min   Anion gap 5 5 - 15    Comment: Performed at [facility], [address], [place], [place] [address]  CBC     Status: Abnormal   Collection Time: 06/26/18  4:19 AM  Result Value Ref Range   WBC 7.2 4.0 - 10.5 K/uL   RBC 3.79 (L) 4.22 - 5.81 MIL/uL   Hemoglobin 12.2 (L) 13.0 - 17.0 g/dL   HCT 04.5 (L) 99.7 - 74.1 %   MCV 99.2 80.0 - 100.0 fL   MCH 32.2 26.0 - 34.0 pg   MCHC 32.4 30.0 - 36.0 g/dL   RDW 42.3 95.3 - 20.2 %   Platelets 246 150 - 400 K/uL   nRBC 0.0 0.0 - 0.2 %    Comment: Performed at [facility], [address], [place], [place] [address]    Imaging / Studies: Dg Chest 2 View  Result Date: 06/25/2018 CLINICAL DATA:  Shortness of breath and cough EXAM: CHEST - 2 VIEW COMPARISON:  June 17, 2016. FINDINGS: There is a small left apical pneumothorax without tension component. There is airspace consolidation in the posterior left base region. There is mild bibasilar atelectasis as well. The lungs elsewhere are clear. Heart size and pulmonary vascularity are normal. No adenopathy. No bone lesions appreciable. IMPRESSION: 1.  Small left apical pneumothorax without tension component. 2. Airspace consolidation consistent with pneumonia posterior left base. 3.  Bibasilar atelectasis. 4.  Heart size normal. Critical Value/emergent results were called by telephone at the time of interpretation on 06/25/2018 at 9:56 am to Dr. Gardner Candle. ED physician, Who verbally acknowledged these results. Electronically Signed   By: Bretta Bang III  M.D.   On: 06/25/2018 09:56   Ct Angio Chest Pe W And/or Wo Contrast  Result Date: 06/25/2018 CLINICAL DATA:  Shortness of breath and chest pain. Recent paraesophageal hernia repair EXAM: CT ANGIOGRAPHY CHEST WITH CONTRAST TECHNIQUE: Multidetector CT imaging of the chest was performed using the standard  protocol during bolus administration of intravenous contrast. Multiplanar CT image reconstructions and MIPs were obtained to evaluate the vascular anatomy. CONTRAST:  100mL ISOVUE-370 IOPAMIDOL (ISOVUE-370) INJECTION 76% COMPARISON:  Chest radiograph June 25, 2018 FINDINGS: Cardiovascular: There is no demonstrable pulmonary embolus. There is no thoracic aortic aneurysm or dissection. The visualized great vessels appear unremarkable. There is no pericardial effusion or pericardial thickening. Mediastinum/Nodes: Visualized thyroid appears normal. There is no appreciable thoracic adenopathy. No esophageal lesions are evident. Lungs/Pleura: There is a moderate pleural effusion on the left which appears partially loculated. There is airspace consolidation consistent with pneumonia in portions of the left lower lobe, primarily involving superior, medial, and posterior segments. There is also patchy infiltrate in portions of the posterolateral segments of the right lower lobe. There is atelectatic change in the inferior aspect of the right middle lobe. There is a small pneumothorax on the left without tension component. This pneumothorax is seen in the apical region. There is also a small amount of loculated air posteromedially on the left. There is no appreciable pneumomediastinum. Upper Abdomen: There are apparent pledgets the gastric cardia consistent with recent surgery. Visualized upper abdominal structures otherwise appear normal. Musculoskeletal: No blastic or lytic bone lesions. No fracture or dislocation evident. No chest wall lesions are appreciable. Review of the MIP images confirms the above findings. IMPRESSION: 1. No demonstrable pulmonary embolus. No thoracic aortic aneurysm or dissection. 2. Small pneumothorax on the left without tension component, similar to chest radiograph obtained earlier in the day. 3. Consolidation consistent with pneumonia throughout much of the left lower lobe with associated  partially loculated pleural effusion on the left. Smaller area of apparent pneumonia involving portions of the posterolateral segments of the right lower lobe. 4.  No appreciable thoracic adenopathy. Electronically Signed   By: Bretta BangWilliam  Woodruff III M.D.   On: 06/25/2018 12:45    Medications / Allergies: per chart  Antibiotics: Anti-infectives (From admission, onward)   Start     Dose/Rate Route Frequency Ordered Stop   06/26/18 1200  vancomycin (VANCOCIN) 1,500 mg in sodium chloride 0.9 % 500 mL IVPB     1,500 mg 250 mL/hr over 120 Minutes Intravenous Every 24 hours 06/25/18 1105     06/25/18 2200  ceFEPIme (MAXIPIME) 2 g in sodium chloride 0.9 % 100 mL IVPB     2 g 200 mL/hr over 30 Minutes Intravenous Every 12 hours 06/25/18 1047     06/25/18 1100  vancomycin (VANCOCIN) 1,500 mg in sodium chloride 0.9 % 500 mL IVPB     1,500 mg 250 mL/hr over 120 Minutes Intravenous  Once 06/25/18 1047 06/25/18 1358   06/25/18 1015  vancomycin (VANCOCIN) IVPB 1000 mg/200 mL premix  Status:  Discontinued     1,000 mg 200 mL/hr over 60 Minutes Intravenous  Once 06/25/18 1013 06/25/18 1047   06/25/18 1015  ceFEPIme (MAXIPIME) 2 g in sodium chloride 0.9 % 100 mL IVPB     2 g 200 mL/hr over 30 Minutes Intravenous  Once 06/25/18 1013 06/25/18 1143        Note: Portions of this report may have been transcribed using voice recognition software. Every effort was made to ensure accuracy; however,  inadvertent computerized transcription errors may be present.   Any transcriptional errors that result from this process are unintentional.     [name], Diaz, FACS, MASCRS Gastrointestinal and Minimally Invasive Surgery    [address] [place], [place] [address] [phone] Main / Paging [phone] Fax

## 2018-06-26 NOTE — Progress Notes (Signed)
Pt has ambulated around nurses station 3-4 times this morning with no complaints.

## 2018-06-27 LAB — BASIC METABOLIC PANEL
Anion gap: 4 — ABNORMAL LOW (ref 5–15)
BUN: 10 mg/dL (ref 6–20)
CO2: 24 mmol/L (ref 22–32)
Calcium: 8.3 mg/dL — ABNORMAL LOW (ref 8.9–10.3)
Chloride: 111 mmol/L (ref 98–111)
Creatinine, Ser: 0.98 mg/dL (ref 0.61–1.24)
GFR calc Af Amer: >60 mL/min (ref >60)
Glucose, Bld: 101 mg/dL — ABNORMAL HIGH (ref 70–99)
Potassium: 4.4 mmol/L (ref 3.5–5.1)
Sodium: 139 mmol/L (ref 135–145)

## 2018-06-27 LAB — CBC
HCT: 33.9 % — ABNORMAL LOW (ref 39.0–52.0)
Hemoglobin: 11.2 g/dL — ABNORMAL LOW (ref 13.0–17.0)
MCH: 32.5 pg (ref 26.0–34.0)
MCHC: 33 g/dL (ref 30.0–36.0)
MCV: 98.3 fL (ref 80.0–100.0)
Platelets: 240 10*3/uL (ref 150–400)
RBC: 3.45 MIL/uL — ABNORMAL LOW (ref 4.22–5.81)
RDW: 11.7 % (ref 11.5–15.5)
WBC: 7 10*3/uL (ref 4.0–10.5)
nRBC: 0 % (ref 0.0–0.2)

## 2018-06-27 MED ORDER — DOCUSATE SODIUM 100 MG PO CAPS: 100.0000 mg | ORAL_CAPSULE | Freq: Every day | ORAL | 0 refills | Status: DC

## 2018-06-27 MED ORDER — AMOXICILLIN-POT CLAVULANATE 875-125 MG PO TABS: 1.0000 | ORAL_TABLET | Freq: Two times a day (BID) | ORAL | 0 refills | Status: AC

## 2018-06-27 MED ORDER — METHOCARBAMOL 750 MG PO TABS: 750.0000 mg | ORAL_TABLET | Freq: Four times a day (QID) | ORAL | 2 refills | Status: DC | PRN

## 2018-06-27 MED ORDER — AMOXICILLIN-POT CLAVULANATE 875-125 MG PO TABS
1.0000 | ORAL_TABLET | Freq: Two times a day (BID) | ORAL | Status: DC
  Administered 2018-06-27: 1 via ORAL
  Filled 2018-06-27: qty 1

## 2018-06-27 MED ORDER — GABAPENTIN 300 MG PO CAPS: 600.0000 mg | ORAL_CAPSULE | Freq: Three times a day (TID) | ORAL | 2 refills | Status: DC

## 2018-06-27 MED ORDER — LEVOFLOXACIN 750 MG PO TABS
750.0000 mg | ORAL_TABLET | Freq: Every day | ORAL | Status: DC
  Filled 2018-06-27: qty 1

## 2018-06-27 NOTE — Progress Notes (Signed)
Jeremy DurhamBrian T Kulick [id number] [birth year]  CARE TEAM:  PCP: Maurice SmallGriffin, Elaine, MD  Outpatient Care Team: Patient Care Team: Maurice SmallGriffin, Elaine, MD as PCP - General (Family Medicine) [name], Alireza Pollack, MD as Consulting Physician (General Surgery) Jeani HawkingHung, Patrick, MD as Consulting Physician (Gastroenterology)  Inpatient Treatment Team: Treatment Team: Attending Provider: Darlin DropHall, Carole N, DO; Consulting Physician: [name], Gabbriella Presswood, MD; Rounding Team: Jessie Footriadhosp, Wl4, MD; Technician: Bradd BurnerBarbee, Hunter A, NT; Consulting Physician: Bishop Limbocs, Md, MD; Student Nurse: Byrd HesselbachMitchell, Brianna J, Student-RN; Technician: Rolland BimlerMohammed, Ayanna, NT; Student Nurse: Maury Dusraper, Lindsay L, RN; Student Nurse: Roswell Minersountree, Brittany E, Student-RN   Problem List:   Principal Problem:   Left lower lobe pneumonia [facility](HCC) Active Problems:   Anxiety state   Essential hypertension   Hiatal hernia s/p robotic repair & Nissen fundoplication 06/20/2018   Hyperlipidemia   History of Nissen fundoplication 06/20/2018   Pneumothorax on left   Loculated pleural effusion   Acute respiratory insufficiency   Chronic pain syndrome  06/20/2018  POST-OPERATIVE DIAGNOSIS:   PARAESOPHAGEAL HIATAL HERNIA   SURGERY:  06/20/2018  Procedure(s): XI ROBOTIC REPAIR OF PARAESOPHAGEAL  HIATAL HERNIA WITH FUNDOPLICATION, WITH MESH  SURGEON:    Surgeon(s): [name], Maylynn Orzechowski, MD Romie Leveehomas, Alicia, MD       Assessment  Recovering  [facility](Hospital Stay = 2 days)  Plan:  I think it is safe for him to get home from a surgery standpoint.  I will defer to medicine.  Had long discussion with the patient and his wife.  Her concerns expressed.  She is concerned he underplays and under medicates with pain issues.  She does feel reassured he looks better and feels better on the standing aggressive pain regimen.  Hopefully can go home later today.  Less pain on his chronic pain regimen.  Gabapentin 600s since that is the higher range of what he normally takes.  Standing Tylenol 1 g  TID.  Oxycodone Robaxin for breakthrough.  Bowel regimen.  Pneumothorax is very small.  This can happen postoperatively with hiatal hernia repairs.  Should resolve naturally on its own.  No evidence of tension.  No progression on f/u CXR.  This likely will repeat in a few weeks to document its resolution.  Left lower lobe collapse suspicious for possible pneumonia.  I would favor atelectasis, but reasonable to proceed with antibiotics per medicine's assessment.  WBC WNL & no fever - [facility]  Dysphasia 1/pured diet per post fundoplication protocol.  Most likely can gradually advance diet in the next week or so to the point of a more regular diet in a month or so  VTE prophylaxis- SCDs, etc.  No evidence of PE  Mobilize as tolerated to help recovery  20 minutes spent in review, evaluation, examination, counseling, and coordination of care.  More than 50% of that time was spent in counseling.  06/27/2018    Subjective: (Chief complaint)  NO pain c/o last shift.  Feels much better overall.  Sitting up in chair.  Wife in room.  Anxious but consolable.  Tolerating Dys1 diet.  No bloating or gassiness.  No significant dysphasia.  Objective:  Vital signs:  Vitals:   06/26/18 1350 06/26/18 1443 06/26/18 2033 06/27/18 0547  BP: 113/73 110/74 101/75 104/74  Pulse: 82 70 73 72  Resp: 16 12 16 16   Temp: 98 F (36.7 C) 98.1 F (36.7 C) 98.5 F (36.9 C) 98.5 F (36.9 C)  TempSrc: Oral Oral Oral   SpO2: 99% 97% 95% 95%  Weight:  Height:        Last BM Date: 06/26/18  Intake/Output   Yesterday:  02/06 0701 - 02/07 0700 In: 2310.6 [P.O.:240; I.V.:1840.8; IV Piggyback:229.8] Out: -  This shift:  Total I/O In: 400 [I.V.:400] Out: -   Bowel function:  Flatus: YES  BM:  No  Drain: (No drain)   Physical Exam:  General: Pt awake/alert/oriented x4 in no acute distress.  Sitting up in chair.  Calm.  Smiling.  Not toxic.  Not sickly. Eyes: PERRL,  normal EOM.  Sclera clear.  No icterus Neuro: CN II-XII intact w/o focal sensory/motor deficits. Lymph: No head/neck/groin lymphadenopathy Psych:  No delerium/psychosis/paranoia HENT: Normocephalic, Mucus membranes moist.  No thrush Neck: Supple, No tracheal deviation Chest: No left-sided chest wall discomfort with rib compression.  No guarding.  Respiratory effort seems normal with w good excursion CV:  Pulses intact.  Regular rhythm MS: Normal AROM mjr joints.  No obvious deformity  Abdomen: Soft.  Nondistended.  Nontender.  No evidence of peritonitis.  No incarcerated hernias.  Ext:  No deformity.  No mjr edema.  No cyanosis.  No shoulder pain today. Skin: No petechiae / purpura  Results:   Labs: Results for orders placed or performed during the hospital encounter of 06/25/18 (from the past 48 hour(s))  Basic metabolic panel     Status: Abnormal   Collection Time: 06/25/18  9:11 AM  Result Value Ref Range   Sodium 134 (L) 135 - 145 mmol/L   Potassium 4.2 3.5 - 5.1 mmol/L   Chloride 96 (L) 98 - 111 mmol/L   CO2 25 22 - 32 mmol/L   Glucose, Bld 137 (H) 70 - 99 mg/dL   BUN 16 6 - 20 mg/dL   Creatinine, Ser 1.61 (H) 0.61 - 1.24 mg/dL   Calcium 9.4 8.9 - 09.6 mg/dL   GFR calc non Af Amer 56 (L) >60 mL/min   GFR calc Af Amer >60 >60 mL/min   Anion gap 13 5 - 15    Comment: Performed at [facility], [address] [place], [place] [address]  CBC     Status: Abnormal   Collection Time: 06/25/18  9:11 AM  Result Value Ref Range   WBC 12.2 (H) 4.0 - 10.5 K/uL   RBC 4.65 4.22 - 5.81 MIL/uL   Hemoglobin 14.9 13.0 - 17.0 g/dL   HCT 98.1 19.1 - 47.8 %   MCV 93.5 80.0 - 100.0 fL   MCH 32.0 26.0 - 34.0 pg   MCHC 34.3 30.0 - 36.0 g/dL   RDW 29.5 62.1 - 30.8 %   Platelets 322 150 - 400 K/uL   nRBC 0.0 0.0 - 0.2 %    Comment: Performed at [facility], [address] [place], [place] [address]  I-stat troponin, ED     Status: None   Collection Time: 06/25/18  9:21 AM    Result Value Ref Range   Troponin i, poc 0.00 0.00 - 0.08 ng/mL   Comment 3            Comment: Due to the release kinetics of cTnI, a negative result within the first hours of the onset of symptoms does not rule out myocardial infarction with certainty. If myocardial infarction is still suspected, repeat the test at appropriate intervals.   Blood Culture (routine x 2)     Status: None (Preliminary result)   Collection Time: 06/25/18 11:00 AM  Result Value Ref Range   Specimen Description  BLOOD RIGHT ANTECUBITAL    Special Requests      BOTTLES DRAWN AEROBIC AND ANAEROBIC Blood Culture results may not be optimal due to an excessive volume of blood received in culture bottles   Culture      NO GROWTH 2 DAYS Performed at [facility], [address], [place], [place] [address]    Report Status PENDING   Lactic acid, plasma     Status: None   Collection Time: 06/25/18 11:03 AM  Result Value Ref Range   Lactic Acid, Venous 1.2 0.5 - 1.9 mmol/L    Comment: Performed at [facility], [address], [place], [place] [address]  Blood Culture (routine x 2)     Status: None (Preliminary result)   Collection Time: 06/25/18 11:26 AM  Result Value Ref Range   Specimen Description BLOOD SITE NOT SPECIFIED    Special Requests      BOTTLES DRAWN AEROBIC AND ANAEROBIC Blood Culture adequate volume   Culture      NO GROWTH 2 DAYS Performed at [facility], [address] [place], [place] [address]    Report Status PENDING   Respiratory Panel by PCR     Status: None   Collection Time: 06/25/18  2:41 PM  Result Value Ref Range   Adenovirus NOT DETECTED NOT DETECTED   Coronavirus 229E NOT DETECTED NOT DETECTED    Comment: (NOTE) The Coronavirus on the Respiratory Panel, DOES NOT test for the novel  Coronavirus (2019 nCoV)    Coronavirus HKU1 NOT DETECTED NOT DETECTED   Coronavirus NL63 NOT DETECTED NOT DETECTED   Coronavirus OC43 NOT DETECTED NOT DETECTED    Metapneumovirus NOT DETECTED NOT DETECTED   Rhinovirus / Enterovirus NOT DETECTED NOT DETECTED   Influenza A NOT DETECTED NOT DETECTED   Influenza B NOT DETECTED NOT DETECTED   Parainfluenza Virus 1 NOT DETECTED NOT DETECTED   Parainfluenza Virus 2 NOT DETECTED NOT DETECTED   Parainfluenza Virus 3 NOT DETECTED NOT DETECTED   Parainfluenza Virus 4 NOT DETECTED NOT DETECTED   Respiratory Syncytial Virus NOT DETECTED NOT DETECTED   Bordetella pertussis NOT DETECTED NOT DETECTED   Chlamydophila pneumoniae NOT DETECTED NOT DETECTED   Mycoplasma pneumoniae NOT DETECTED NOT DETECTED    Comment: Performed at [facility], [address], [place], [place] [address]  Strep pneumoniae urinary antigen     Status: None   Collection Time: 06/25/18  8:18 PM  Result Value Ref Range   Strep Pneumo Urinary Antigen NEGATIVE NEGATIVE    Comment:        Infection due to S. pneumoniae cannot be absolutely ruled out since the antigen present may be below the detection limit of the test. PERFORMED AT [facility] Performed at [facility], [address] [place], [place] [address]   Basic metabolic panel     Status: Abnormal   Collection Time: 06/26/18  4:19 AM  Result Value Ref Range   Sodium 136 135 - 145 mmol/L   Potassium 4.6 3.5 - 5.1 mmol/L   Chloride 108 98 - 111 mmol/L   CO2 23 22 - 32 mmol/L   Glucose, Bld 98 70 - 99 mg/dL   BUN 15 6 - 20 mg/dL   Creatinine, Ser 7.84 0.61 - 1.24 mg/dL   Calcium 8.2 (L) 8.9 - 10.3 mg/dL   GFR calc non Af Amer >60 >60 mL/min   GFR calc Af Amer >60 >60 mL/min  Anion gap 5 5 - 15    Comment: Performed at [facility], [address] [place], [place] [address]  CBC     Status: Abnormal   Collection Time: 06/26/18  4:19 AM  Result Value Ref Range   WBC 7.2 4.0 - 10.5 K/uL   RBC 3.79 (L) 4.22 - 5.81 MIL/uL   Hemoglobin 12.2 (L) 13.0 - 17.0 g/dL   HCT 60.437.6 (L) 54.039.0 - 98.152.0 %   MCV 99.2 80.0 - 100.0 fL   MCH 32.2 26.0  - 34.0 pg   MCHC 32.4 30.0 - 36.0 g/dL   RDW 19.111.7 47.811.5 - 29.515.5 %   Platelets 246 150 - 400 K/uL   nRBC 0.0 0.0 - 0.2 %    Comment: Performed at [facility], [address] [place], [place] [address]  HIV Antibody (routine testing w rflx)     Status: None   Collection Time: 06/26/18  4:19 AM  Result Value Ref Range   HIV Screen 4th Generation wRfx Non Reactive Non Reactive    Comment: (NOTE) Performed At: [facility] [address] [place], [place] [id number] Jolene SchimkeNagendra Sanjai MD EX:[id number]:[phone]   MRSA PCR Screening     Status: None   Collection Time: 06/26/18  8:31 AM  Result Value Ref Range   MRSA by PCR NEGATIVE NEGATIVE    Comment:        The GeneXpert MRSA Assay (FDA approved for NASAL specimens only), is one component of a comprehensive MRSA colonization surveillance program. It is not intended to diagnose MRSA infection nor to guide or monitor treatment for MRSA infections. Performed at [facility], [address] [place], [place] [address]   CBC     Status: Abnormal   Collection Time: 06/27/18  4:23 AM  Result Value Ref Range   WBC 7.0 4.0 - 10.5 K/uL   RBC 3.45 (L) 4.22 - 5.81 MIL/uL   Hemoglobin 11.2 (L) 13.0 - 17.0 g/dL   HCT 53.633.9 (L) 64.439.0 - 03.452.0 %   MCV 98.3 80.0 - 100.0 fL   MCH 32.5 26.0 - 34.0 pg   MCHC 33.0 30.0 - 36.0 g/dL   RDW 74.211.7 59.511.5 - 63.815.5 %   Platelets 240 150 - 400 K/uL   nRBC 0.0 0.0 - 0.2 %    Comment: Performed at [facility], [address] [place], [place] [address]  Basic metabolic panel     Status: Abnormal   Collection Time: 06/27/18  4:23 AM  Result Value Ref Range   Sodium 139 135 - 145 mmol/L   Potassium 4.4 3.5 - 5.1 mmol/L   Chloride 111 98 - 111 mmol/L   CO2 24 22 - 32 mmol/L   Glucose, Bld 101 (H) 70 - 99 mg/dL   BUN 10 6 - 20 mg/dL   Creatinine, Ser 3.290.98 0.61 - 1.24 mg/dL   Calcium 8.3 (L) 8.9 - 10.3 mg/dL   GFR calc non Af Amer >60 >60 mL/min   GFR calc  Af Amer >60 >60 mL/min   Anion gap 4 (L) 5 - 15    Comment: Performed at [facility], [address] [place], [place] [address]    Imaging / Studies: Dg Chest 2 View  Result Date: 06/26/2018 CLINICAL DATA:  Patient reports having a pneumonia and a left sided pneumothorax. Hx of hypertension. Patient states he is feeling better. EXAM: CHEST - 2 VIEW COMPARISON:  Chest CT, and chest  radiographs, 06/25/2018. FINDINGS: Consolidation at the left lung base is similar to the prior exams. There is linear opacity at the right lung base, also stable, consistent with atelectasis. Remainder of the lungs is clear. Small left apical pneumothorax is without significant change from the prior exam. No pleural effusion. Heart, mediastinum and hila are unremarkable. Skeletal structures are intact. IMPRESSION: 1. No radiographic change in the left lower lobe consolidation or right lung base atelectasis. No change in the small left apical pneumothorax. No new abnormalities. Electronically Signed   By: Amie Portland M.D.   On: 06/26/2018 10:24   Dg Chest 2 View  Result Date: 06/25/2018 CLINICAL DATA:  Shortness of breath and cough EXAM: CHEST - 2 VIEW COMPARISON:  June 17, 2016. FINDINGS: There is a small left apical pneumothorax without tension component. There is airspace consolidation in the posterior left base region. There is mild bibasilar atelectasis as well. The lungs elsewhere are clear. Heart size and pulmonary vascularity are normal. No adenopathy. No bone lesions appreciable. IMPRESSION: 1.  Small left apical pneumothorax without tension component. 2. Airspace consolidation consistent with pneumonia posterior left base. 3.  Bibasilar atelectasis. 4.  Heart size normal. Critical Value/emergent results were called by telephone at the time of interpretation on 06/25/2018 at 9:56 am to Dr. Gardner Candle. ED physician, Who verbally acknowledged these results. Electronically Signed   By: Bretta Bang  III M.D.   On: 06/25/2018 09:56   Ct Angio Chest Pe W And/or Wo Contrast  Result Date: 06/25/2018 CLINICAL DATA:  Shortness of breath and chest pain. Recent paraesophageal hernia repair EXAM: CT ANGIOGRAPHY CHEST WITH CONTRAST TECHNIQUE: Multidetector CT imaging of the chest was performed using the standard protocol during bolus administration of intravenous contrast. Multiplanar CT image reconstructions and MIPs were obtained to evaluate the vascular anatomy. CONTRAST:  ISOVUE-370 IOPAMIDOL (ISOVUE-370) INJECTION 76% COMPARISON:  Chest radiograph June 25, 2018 FINDINGS: Cardiovascular: There is no demonstrable pulmonary embolus. There is no thoracic aortic aneurysm or dissection. The visualized great vessels appear unremarkable. There is no pericardial effusion or pericardial thickening. Mediastinum/Nodes: Visualized thyroid appears normal. There is no appreciable thoracic adenopathy. No esophageal lesions are evident. Lungs/Pleura: There is a moderate pleural effusion on the left which appears partially loculated. There is airspace consolidation consistent with pneumonia in portions of the left lower lobe, primarily involving superior, medial, and posterior segments. There is also patchy infiltrate in portions of the posterolateral segments of the right lower lobe. There is atelectatic change in the inferior aspect of the right middle lobe. There is a small pneumothorax on the left without tension component. This pneumothorax is seen in the apical region. There is also a small amount of loculated air posteromedially on the left. There is no appreciable pneumomediastinum. Upper Abdomen: There are apparent pledgets the gastric cardia consistent with recent surgery. Visualized upper abdominal structures otherwise appear normal. Musculoskeletal: No blastic or lytic bone lesions. No fracture or dislocation evident. No chest wall lesions are appreciable. Review of the MIP images confirms the above findings.  IMPRESSION: 1. No demonstrable pulmonary embolus. No thoracic aortic aneurysm or dissection. 2. Small pneumothorax on the left without tension component, similar to chest radiograph obtained earlier in the day. 3. Consolidation consistent with pneumonia throughout much of the left lower lobe with associated partially loculated pleural effusion on the left. Smaller area of apparent pneumonia involving portions of the posterolateral segments of the right lower lobe. 4.  No appreciable thoracic adenopathy. Electronically Signed   By:  Bretta Bang III M.D.   On: 06/25/2018 12:45    Medications / Allergies: per chart  Antibiotics: Anti-infectives (From admission, onward)   Start     Dose/Rate Route Frequency Ordered Stop   06/27/18 0900  levofloxacin (LEVAQUIN) tablet 750 mg     750 mg Oral Daily 06/27/18 0707 07/02/18 0959   06/26/18 1200  vancomycin (VANCOCIN) 1,500 mg in sodium chloride 0.9 % 500 mL IVPB  Status:  Discontinued     1,500 mg 250 mL/hr over 120 Minutes Intravenous Every 24 hours 06/25/18 1105 06/26/18 1052   06/25/18 2200  ceFEPIme (MAXIPIME) 2 g in sodium chloride 0.9 % 100 mL IVPB  Status:  Discontinued     2 g 200 mL/hr over 30 Minutes Intravenous Every 12 hours 06/25/18 1047 06/27/18 0707   06/25/18 1100  vancomycin (VANCOCIN) 1,500 mg in sodium chloride 0.9 % 500 mL IVPB     1,500 mg 250 mL/hr over 120 Minutes Intravenous  Once 06/25/18 1047 06/25/18 1358   06/25/18 1015  vancomycin (VANCOCIN) IVPB 1000 mg/200 mL premix  Status:  Discontinued     1,000 mg 200 mL/hr over 60 Minutes Intravenous  Once 06/25/18 1013 06/25/18 1047   06/25/18 1015  ceFEPIme (MAXIPIME) 2 g in sodium chloride 0.9 % 100 mL IVPB     2 g 200 mL/hr over 30 Minutes Intravenous  Once 06/25/18 1013 06/25/18 1143        Note: Portions of this report may have been transcribed using voice recognition software. Every effort was made to ensure accuracy; however, inadvertent computerized transcription  errors may be present.   Any transcriptional errors that result from this process are unintentional.     [name], MD, FACS, MASCRS Gastrointestinal and Minimally Invasive Surgery    [address] [place], [place] [address] [phone] Main / Paging [phone] Fax

## 2018-06-27 NOTE — Discharge Summary (Signed)
Discharge Summary  Dale DurhamBrian T Canion ZOX:[id number] DOB: Jun 18, 1965  PCP: Maurice SmallGriffin, Elaine, MD  Admit date: 06/25/2018 Discharge date: 06/27/2018  Time spent: 35 minutes  Recommendations for Outpatient Follow-up:  1. Follow-up with general surgery 2. Follow-up with your primary care provider 3. Take your medications as prescribed 4. Mobilize as tolerated to help recovery  Diet recommendations per general surgery: Dysphasia 1/pured diet per post fundoplication protocol.  Most likely can gradually advance diet in the next week or so to the point of a more regular diet in a month or so.   Discharge Diagnoses:  Active Hospital Problems   Diagnosis Date Noted  . Left lower lobe pneumonia (HCC) 06/25/2018  . Chronic pain syndrome 06/26/2018  . Pneumothorax on left 06/25/2018  . Loculated pleural effusion 06/25/2018  . Acute respiratory insufficiency 06/25/2018  . Hiatal hernia s/p robotic repair & Nissen fundoplication 06/20/2018 06/20/2018  . History of Nissen fundoplication 06/20/2018 06/20/2018  . Essential hypertension 11/05/2014  . Hyperlipidemia 01/14/2012  . Anxiety state 01/25/2009    Resolved Hospital Problems  No resolved problems to display.    Discharge Condition: Stable  Diet recommendation: Resume dysphagia 1 diet as recommended by general surgery.  Vitals:   06/27/18 0547 06/27/18 1019  BP: 104/74 117/81  Pulse: 72 70  Resp: 16 14  Temp: 98.5 F (36.9 C) 98.7 F (37.1 C)  SpO2: 95% 98%    History of present illness:   Jeremy SoursBrian T Rossis a 53 y.o.malewith medical history significant ofhyperlipidemia, hypertension, hiatal hernia with severe gastroesophageal reflux disease status post Nissen fundoplication repair on June 20, 2018. He presents for evaluation of left-sided chest pain and shortness of breath since his recent surgery. Pain is sharp it is 8 out of 10 it is dull and present constantly. It significantly worsens with any movement, burping, or deep  inspiration. Pain radiates to his left shoulder, left axilla and left back. Pain has been present since he woke up from surgery. Pain is slightly alleviated if he stays still or if he sits leaning forward. It was actually noted upon waking up from surgery however initially it was mostly in his left shoulder.  Since then it has worsened. He spoke to his surgeon about the pain was told it was likely related to gas. He has been burping and having bowel movements but there has been no improvement in the pain. An esophagram was done the day after surgery and was normal. He has been taking gabapentin 300 mg 3 times daily and oxycodone as prescribed by his surgeon without relief. He has associated shortness of breath which he is feeling like a restricted ability to take a deep breath.   Denies personal or family history of coronary disease.  Denies tobacco use or recent prolonged travel, he has been somewhat immobilized because he has not been getting up and around much since his surgery due to severe left-sided chest pain. Denies lower extremity edema or calf pain, or associated fevers or chills.  Upon presentation to the ED, chest x-ray showed a pneumothorax and pneumonia.  A CT scan of the chest was obtained which confirmed this. Lab studies remarkable for leukocytosis with white blood cell count of 12.2. He was tachycardic and tachypneic and his sats dropped into the low 90s. He was started on IV vancomycin and cefepime empirically for HCAP.  MRSA screen negative on 06/26/2018, IV vancomycin DC'd.  He had a repeat chest x-ray done on 06/26/2018 to reassess left apical pneumothorax. No new abnormalities  and no change in the small left apical pneumothorax found.  Vital signs stable and general surgery followed.   06/27/2018: Patient seen and examined with his wife at bedside.  No acute events overnight.  Patient has been ambulating without difficulty.  His O2 saturation on room air have been greater  than 95%.  He denies any significant pleuritic chest pain, rating it at 2 out of 10 at the most.  Will stop IV antibiotics cefepime and switch to oral Augmentin twice daily x5 days.  On the day of discharge, the patient was hemodynamically stable.  He will need to follow-up with general surgery and his PCP posthospitalization.  Patient understands and agrees to plan.  All questions answered to his and his wife satisfaction.   Hospital Course:  Principal Problem:   Left lower lobe pneumonia [facility]) Active Problems:   Anxiety state   Essential hypertension   Hiatal hernia s/p robotic repair & Nissen fundoplication 06/20/2018   Hyperlipidemia   History of Nissen fundoplication 06/20/2018   Pneumothorax on left   Loculated pleural effusion   Acute respiratory insufficiency   Chronic pain syndrome  Left lower lobe HCAP with partially loculated left-sided pleural effusion: MRSA screening negative on 06/26/2018, DC'd IV vancomycin Independently reviewed chest x-ray and CT done on admission which revealed left lower lobe infiltrates as well as partially loculated left-sided pleural effusion.  No evidence of PE. Maintain O2 saturation greater than 92% Leukocytosis is resolved from 12,200 to 7000 on IV antibiotics Afebrile, vital signs stable, lab studies unremarkable Completed 3 days of cefepime Switch to oral Augmentin twice daily x5 days Follow-up with your primary care provider  Small left pneumothorax:  Independent reviewed CT chest done on admission no sign of tension pneumothorax General surgery has been consulted and followed Repeat chest x-ray done on 06/26/2018 revealed no changes   Partially loculated left pleural effusion Completed 3 days of cefepime Start Augmentin twice daily x5 days Has been maintaining O2 saturation greater than 95% on room air  Hiatal hernia status post robotic repair and Nissen fundoplication on 06/20/2018: Continue pured diet No acute issues Continue  dysphagia 1/pured diet per post fundoplication protocol  Resolved AKI Baseline creatinine appears to be 1.0 with GFR greater than 60 Presented with creatinine of 1.44 Creatinine on day of discharge 0.98 with GFR greater than 60 Continue to avoid nephrotoxic agents Avoid dehydration  Essential hypertension: Blood pressures currently stable.  Resume home lisinopril and HCTZ.  Resolved transient bilateral lower extremity edema suspect iatrogenic Received IV fluid hydration during hospitalization normal saline continuously at 125 cc/h initially which was decreased to 75 cc/h then 50 cc/h This morning his bilateral lower extremity edema had resolved No new findings.  Resume HCTZ and lisinopril  Hyperlipidemia: Continue Lipitor 40 mg daily.  Follow-up with your PCP.     Code Status:Full code  Consults called:Dr. gross from general surgery    Discharge Exam: BP 117/81 (BP Location: Right Arm)   Pulse 70   Temp 98.7 F (37.1 C) (Oral)   Resp 14   Ht 5\' 7"  (1.702 m)   Wt 74.8 kg   SpO2 98%   BMI 25.84 kg/m  . General: 53 y.o. year-old male well developed well nourished in no acute distress.  Alert and oriented x3. . Cardiovascular: Regular rate and rhythm with no rubs or gallops.  No thyromegaly or JVD noted.   Marland Kitchen Respiratory: Clear to auscultation with no wheezes or rales. Good inspiratory effort. . Abdomen: Soft nontender  nondistended with normal bowel sounds x4 quadrants. . Musculoskeletal: No lower extremity edema. 2/4 pulses in all 4 extremities. . Skin: No ulcerative lesions noted or rashes, . Psychiatry: Mood is appropriate for condition and setting  Discharge Instructions You were cared for by a hospitalist during your hospital stay. If you have any questions about your discharge medications or the care you received while you were in the hospital after you are discharged, you can call the unit and asked to speak with the hospitalist on call if the hospitalist  that took care of you is not available. Once you are discharged, your primary care physician will handle any further medical issues. Please note that NO REFILLS for any discharge medications will be authorized once you are discharged, as it is imperative that you return to your primary care physician (or establish a relationship with a primary care physician if you do not have one) for your aftercare needs so that they can reassess your need for medications and monitor your lab values.  Discharge Instructions    Call MD for:   Complete by:  As directed    Temperature > 101.51F   Call MD for:  extreme fatigue   Complete by:  As directed    Call MD for:  hives   Complete by:  As directed    Call MD for:  persistant nausea and vomiting   Complete by:  As directed    Call MD for:  redness, tenderness, or signs of infection (pain, swelling, redness, odor or green/yellow discharge around incision site)   Complete by:  As directed    Call MD for:  severe uncontrolled pain   Complete by:  As directed    Diet general   Complete by:  As directed    SEE ESOPHAGEAL SURGERY DIET INSTRUCTIONS  We using usually start you out on a pureed (blenderized) diet. Expect some sticking with swallowing over the next 1-2 months.   This is due to swelling around your esophagus at the wrap & hiatal diaphragm repair.  It will gradually ease off over the next few months.   Discharge instructions   Complete by:  As directed    Please see discharge instruction sheets.   Also refer to any handouts/printouts that may have been given from the [facility] surgery office (if you visited [place] there before surgery) Please call our office if you have any questions or concerns [phone]   Driving Restrictions   Complete by:  As directed    No driving until off narcotics and can safely swerve away without pain during an emergency   Increase activity slowly   Complete by:  As directed    Lifting restrictions   Complete by:  As  directed    Avoid heavy lifting initially, <20 pounds at first.   Do not push through pain.   You have no specific weight limit: If it hurts to do, DON'T DO IT.    If you feel no pain, you are not injuring anything.  Pain will protect you from injury.   Coughing and sneezing are far more stressful to your incision than any lifting.   Avoid resuming heavy lifting (>50 pounds) or other intense activity until off all narcotic pain medications.   When want to exercise more, give yourself 2 weeks to gradually get back to full intense exercise/activity.   May shower / Bathe   Complete by:  As directed    SHOWER EVERY DAY.  It is  fine for dressings or wounds to be washed/rinsed.  Use gentle soap & water.  This will help the incisions and/or wounds get clean & minimize infection.   May walk up steps   Complete by:  As directed    Remove dressing in 72 hours   Complete by:  As directed    You have closed incisions: Shower and bathe over these incisions with soap and water every day.  It is OK to wash over the dressings: they are waterproof. Remove all surgical dressings on postoperative day #3.  You do not need to replace dressings over the closed incisions unless you feel more comfortable with a Band-Aid covering it.   Please call our office [phone] if you have further questions.   Sexual Activity Restrictions   Complete by:  As directed    Sexual activity as tolerated.  Do not push through pain.  Pain will protect you from injury.   Walk with assistance   Complete by:  As directed    Walk over an hour a day.  May use a walker/cane/companion to help with balance and stamina.     Allergies as of 06/27/2018      Reactions   Other Anaphylaxis   Patient can not eat eggs, but he can receive egg derived products.   Amoxil [amoxicillin Trihydrate] Diarrhea, Itching, Other (See Comments)   Has patient had a PCN reaction causing immediate rash, facial/tongue/throat swelling, SOB or  lightheadedness with hypotension: no Has patient had a PCN reaction causing severe rash involving mucus membranes or skin necrosis: no Has patient had a PCN reaction that required hospitalization no Has patient had a PCN reaction occurring within the last 10 years: yes If all of the above answers are "NO", then may proceed with Cephalosporin use.   Banana Itching   Skelaxin Itching      Medication List    STOP taking these medications   traMADol 50 MG tablet Commonly known as:  ULTRAM     TAKE these medications   AMBULATORY NON FORMULARY MEDICATION Inject into the muscle 2 (two) times a week. Medication Name:allergy shots   amoxicillin-clavulanate 875-125 MG tablet Commonly known as:  AUGMENTIN Take 1 tablet by mouth every 12 (twelve) hours for 5 days.   atorvastatin 40 MG tablet Commonly known as:  LIPITOR Take 40 mg by mouth daily.   buPROPion 150 MG 24 hr tablet Commonly known as:  WELLBUTRIN XL Take 150 mg by mouth daily.   docusate sodium 100 MG capsule Commonly known as:  COLACE Take 1 capsule (100 mg total) by mouth daily. Start taking on:  June 28, 2018   EPIPEN 2-PAK 0.3 mg/0.3 mL Soaj injection Generic drug:  EPINEPHrine Inject 0.3 mg into the muscle once as needed (For anaphylaxis.).   gabapentin 300 MG capsule Commonly known as:  NEURONTIN Take 2 capsules (600 mg total) by mouth 3 (three) times daily. What changed:    how much to take  when to take this  reasons to take this   lisinopril-hydrochlorothiazide 10-12.5 MG tablet Commonly known as:  PRINZIDE,ZESTORETIC Take 1 tablet by mouth daily.   methocarbamol 750 MG tablet Commonly known as:  ROBAXIN Take 1 tablet (750 mg total) by mouth 4 (four) times daily as needed (use for muscle cramps/pain).   oxyCODONE 5 MG immediate release tablet Commonly known as:  Oxy IR/ROXICODONE Take 5-10 mg by mouth every 6 (six) hours as needed for pain.   pantoprazole 40 MG tablet Commonly known  as:   PROTONIX Take 40 mg by mouth 2 (two) times daily.      Allergies  Allergen Reactions  . Other Anaphylaxis    Patient can not eat eggs, but he can receive egg derived products.  . Amoxil [Amoxicillin Trihydrate] Diarrhea, Itching and Other (See Comments)    Has patient had a PCN reaction causing immediate rash, facial/tongue/throat swelling, SOB or lightheadedness with hypotension: no Has patient had a PCN reaction causing severe rash involving mucus membranes or skin necrosis: no Has patient had a PCN reaction that required hospitalization no Has patient had a PCN reaction occurring within the last 10 years: yes If all of the above answers are "NO", then may proceed with Cephalosporin use.  . Banana Itching  . Skelaxin Itching      The results of significant diagnostics from this hospitalization (including imaging, microbiology, ancillary and laboratory) are listed below for reference.    Significant Diagnostic Studies: Dg Chest 2 View  Result Date: 06/26/2018 CLINICAL DATA:  Patient reports having a pneumonia and a left sided pneumothorax. Hx of hypertension. Patient states he is feeling better. EXAM: CHEST - 2 VIEW COMPARISON:  Chest CT, and chest radiographs, 06/25/2018. FINDINGS: Consolidation at the left lung base is similar to the prior exams. There is linear opacity at the right lung base, also stable, consistent with atelectasis. Remainder of the lungs is clear. Small left apical pneumothorax is without significant change from the prior exam. No pleural effusion. Heart, mediastinum and hila are unremarkable. Skeletal structures are intact. IMPRESSION: 1. No radiographic change in the left lower lobe consolidation or right lung base atelectasis. No change in the small left apical pneumothorax. No new abnormalities. Electronically Signed   By: Amie Portlandavid  Ormond M.D.   On: 06/26/2018 10:24   Dg Chest 2 View  Result Date: 06/25/2018 CLINICAL DATA:  Shortness of breath and cough EXAM:  CHEST - 2 VIEW COMPARISON:  June 17, 2016. FINDINGS: There is a small left apical pneumothorax without tension component. There is airspace consolidation in the posterior left base region. There is mild bibasilar atelectasis as well. The lungs elsewhere are clear. Heart size and pulmonary vascularity are normal. No adenopathy. No bone lesions appreciable. IMPRESSION: 1.  Small left apical pneumothorax without tension component. 2. Airspace consolidation consistent with pneumonia posterior left base. 3.  Bibasilar atelectasis. 4.  Heart size normal. Critical Value/emergent results were called by telephone at the time of interpretation on 06/25/2018 at 9:56 am to Dr. Gardner CandleM Butler. ED physician, Who verbally acknowledged these results. Electronically Signed   By: Bretta BangWilliam  Woodruff III M.D.   On: 06/25/2018 09:56   Ct Angio Chest Pe W And/or Wo Contrast  Result Date: 06/25/2018 CLINICAL DATA:  Shortness of breath and chest pain. Recent paraesophageal hernia repair EXAM: CT ANGIOGRAPHY CHEST WITH CONTRAST TECHNIQUE: Multidetector CT imaging of the chest was performed using the standard protocol during bolus administration of intravenous contrast. Multiplanar CT image reconstructions and MIPs were obtained to evaluate the vascular anatomy. CONTRAST:  100mL ISOVUE-370 IOPAMIDOL (ISOVUE-370) INJECTION 76% COMPARISON:  Chest radiograph June 25, 2018 FINDINGS: Cardiovascular: There is no demonstrable pulmonary embolus. There is no thoracic aortic aneurysm or dissection. The visualized great vessels appear unremarkable. There is no pericardial effusion or pericardial thickening. Mediastinum/Nodes: Visualized thyroid appears normal. There is no appreciable thoracic adenopathy. No esophageal lesions are evident. Lungs/Pleura: There is a moderate pleural effusion on the left which appears partially loculated. There is airspace consolidation consistent with pneumonia in portions  of the left lower lobe, primarily involving  superior, medial, and posterior segments. There is also patchy infiltrate in portions of the posterolateral segments of the right lower lobe. There is atelectatic change in the inferior aspect of the right middle lobe. There is a small pneumothorax on the left without tension component. This pneumothorax is seen in the apical region. There is also a small amount of loculated air posteromedially on the left. There is no appreciable pneumomediastinum. Upper Abdomen: There are apparent pledgets the gastric cardia consistent with recent surgery. Visualized upper abdominal structures otherwise appear normal. Musculoskeletal: No blastic or lytic bone lesions. No fracture or dislocation evident. No chest wall lesions are appreciable. Review of the MIP images confirms the above findings. IMPRESSION: 1. No demonstrable pulmonary embolus. No thoracic aortic aneurysm or dissection. 2. Small pneumothorax on the left without tension component, similar to chest radiograph obtained earlier in the day. 3. Consolidation consistent with pneumonia throughout much of the left lower lobe with associated partially loculated pleural effusion on the left. Smaller area of apparent pneumonia involving portions of the posterolateral segments of the right lower lobe. 4.  No appreciable thoracic adenopathy. Electronically Signed   By: Bretta Bang III M.D.   On: 06/25/2018 12:45   Dg Esophagus W Single Cm (sol Or Thin Ba)  Result Date: 06/21/2018 CLINICAL DATA:  Post Nissen fundoplication and hiatal hernia repair yesterday. EXAM: ESOPHOGRAM/BARIUM SWALLOW TECHNIQUE: Single contrast examination was performed using water-soluble contrast. FLUOROSCOPY TIME:  24 seconds (5.1 mGy) COMPARISON:  CT abdomen and pelvis - 06/09/2018 FINDINGS: Ingested contrast passes freely through the distal esophagus and GE junction without evidence of occlusion, stricture, significant retention or contrast extravasation. IMPRESSION: No evidence of complication  following hiatal hernia repair. Specifically, no evidence of stricture or leak. Electronically Signed   By: Simonne Come M.D.   On: 06/21/2018 11:06    Microbiology: Recent Results (from the past 240 hour(s))  Blood Culture (routine x 2)     Status: None (Preliminary result)   Collection Time: 06/25/18 11:00 AM  Result Value Ref Range Status   Specimen Description BLOOD RIGHT ANTECUBITAL  Final   Special Requests   Final    BOTTLES DRAWN AEROBIC AND ANAEROBIC Blood Culture results may not be optimal due to an excessive volume of blood received in culture bottles   Culture   Final    NO GROWTH 2 DAYS Performed at [facility], [address], [place], [place] [address]    Report Status PENDING  Incomplete  Blood Culture (routine x 2)     Status: None (Preliminary result)   Collection Time: 06/25/18 11:26 AM  Result Value Ref Range Status   Specimen Description BLOOD SITE NOT SPECIFIED  Final   Special Requests   Final    BOTTLES DRAWN AEROBIC AND ANAEROBIC Blood Culture adequate volume   Culture   Final    NO GROWTH 2 DAYS Performed at [facility], [address], [place], [place] [address]    Report Status PENDING  Incomplete  Respiratory Panel by PCR     Status: None   Collection Time: 06/25/18  2:41 PM  Result Value Ref Range Status   Adenovirus NOT DETECTED NOT DETECTED Final   Coronavirus 229E NOT DETECTED NOT DETECTED Final    Comment: (NOTE) The Coronavirus on the Respiratory Panel, DOES NOT test for the novel  Coronavirus (2019 nCoV)    Coronavirus HKU1 NOT DETECTED NOT DETECTED Final   Coronavirus  NL63 NOT DETECTED NOT DETECTED Final   Coronavirus OC43 NOT DETECTED NOT DETECTED Final   Metapneumovirus NOT DETECTED NOT DETECTED Final   Rhinovirus / Enterovirus NOT DETECTED NOT DETECTED Final   Influenza A NOT DETECTED NOT DETECTED Final   Influenza B NOT DETECTED NOT DETECTED Final   Parainfluenza Virus 1 NOT DETECTED NOT DETECTED Final   Parainfluenza  Virus 2 NOT DETECTED NOT DETECTED Final   Parainfluenza Virus 3 NOT DETECTED NOT DETECTED Final   Parainfluenza Virus 4 NOT DETECTED NOT DETECTED Final   Respiratory Syncytial Virus NOT DETECTED NOT DETECTED Final   Bordetella pertussis NOT DETECTED NOT DETECTED Final   Chlamydophila pneumoniae NOT DETECTED NOT DETECTED Final   Mycoplasma pneumoniae NOT DETECTED NOT DETECTED Final    Comment: Performed at [facility], [address], [place], [place] [address]  MRSA PCR Screening     Status: None   Collection Time: 06/26/18  8:31 AM  Result Value Ref Range Status   MRSA by PCR NEGATIVE NEGATIVE Final    Comment:        The GeneXpert MRSA Assay (FDA approved for NASAL specimens only), is one component of a comprehensive MRSA colonization surveillance program. It is not intended to diagnose MRSA infection nor to guide or monitor treatment for MRSA infections. Performed at [facility], [address], [place], [place] [address]      Labs: Basic Metabolic Panel: Recent Labs  Lab 06/25/18 0911 06/26/18 0419 06/27/18 0423  NA 134* 136 139  K 4.2 4.6 4.4  CL 96* 108 111  CO2 25 23 24   GLUCOSE 137* 98 101*  BUN 16 15 10   CREATININE 1.44* 1.06 0.98  CALCIUM 9.4 8.2* 8.3*   Liver Function Tests: No results for input(s): AST, ALT, ALKPHOS, BILITOT, PROT, ALBUMIN in the last 168 hours. No results for input(s): LIPASE, AMYLASE in the last 168 hours. No results for input(s): AMMONIA in the last 168 hours. CBC: Recent Labs  Lab 06/25/18 0911 06/26/18 0419 06/27/18 0423  WBC 12.2* 7.2 7.0  HGB 14.9 12.2* 11.2*  HCT 43.5 37.6* 33.9*  MCV 93.5 99.2 98.3  PLT 322 246 240   Cardiac Enzymes: No results for input(s): CKTOTAL, CKMB, CKMBINDEX, TROPONINI in the last 168 hours. BNP: BNP (last 3 results) No results for input(s): BNP in the last 8760 hours.  ProBNP (last 3 results) No results for input(s): PROBNP in the last 8760 hours.  CBG: No  results for input(s): GLUCAP in the last 168 hours.     Signed:  Darlin Drop, MD [facility] 06/27/2018, 12:07 PM

## 2018-06-27 NOTE — Discharge Instructions (Signed)
Healthcare-Associated Pneumonia  Healthcare-associated pneumonia is a lung infection that a person can get when in a health care setting or during certain procedures. The infection causes air sacs inside the lungs to fill with pus or fluid. Healthcare-associated pneumonia is usually caused by bacteria that are common in health care settings. These bacteria may be resistant to some antibiotic medicines. What are the causes? This condition is caused by bacteria that get into your lungs. You can get this condition if you:  Breathe in droplets from an infected person's cough or sneeze.  Touch something that an infected person coughed or sneezed on and then touch your mouth, nose, or eyes.  Have a bacterial infection somewhere else in your body, if the bacteria spread to your lungs through your blood. What increases the risk? This condition is more likely to develop in people who:  Have a disease that weakens their body's defense system (immune system) or their ability to cough out germs.  Are older than age 53.  Having trouble swallowing.  Use a feeding or breathing tube.  Have a cold or the flu.  Have an IV tube inserted in a vein.  Have surgery.  Have a bed sore.  Live in a long-term care facility, such as a nursing home.  Were in the hospital for two or more days in the past 3 months.  Received hemodialysis in the past 30 days. What are the signs or symptoms? Symptoms of this condition include:  Fever.  Chills.  Cough.  Shortness of breath.  Wheezing or crackling sounds when breathing. How is this diagnosed? This condition may be diagnosed based on:  Your symptoms.  A chest X-ray.  A measurement of the amount of oxygen in your blood. How is this treated? This condition is treated with antibiotics. Your health care provider may take a sample of cells (culture) from your throat to determine what type of bacteria is in your lungs and change your antibiotic based  on the results. If you have bacteria in your blood, trouble breathing, or a low oxygen level, you may need to be treated at the hospital. At the hospital, you will be given antibiotics through an IV tube. You may also be given oxygen or breathing treatments. Follow these instructions at home: Medicine  Take your antibiotic medicine as told by your health care provider. Do not stop taking the antibiotic even if you start to feel better.  Take over-the-counter and other prescription medicines only as told by your health care provider. Activity  Rest at home until you feel better.  Return to your normal activities as told by your health care provider. Ask your health care provider what activities are safe for you. General instructions   Drink enough fluid to keep your urine clear or pale yellow.  Do not use any products that contain nicotine or tobacco, such as cigarettes and e-cigarettes. If you need help quitting, ask your health care provider.  Limit alcohol intake to no more than 1 drink per day for nonpregnant women and 2 drinks per day for men. One drink equals 12 oz of beer, 5 oz of wine, or 1 oz of hard liquor.  Keep all follow-up visits as told by your health care provider. This is important. How is this prevented? Actions that I can take To lower your risk of getting this condition again:  Do not smoke. This includes e-cigarettes.  Do not drink too much alcohol.  Keep your immune system healthy by eating  well and getting enough sleep.  Get a flu shot every year (annually).  Get a pneumonia vaccination if: ? You are older than age 52. ? You smoke. ? You have a long-lasting condition like lung disease.  Exercise your lungs by taking deep breaths, walking, and using an incentive spirometer as directed.  Wash your hands often with soap and water. If you cannot get to a sink to wash your hands, use an alcohol-based hand cleaner.  Make sure your health care providers are  washing their hands. If you do not see them wash their hands, ask them to do so.  When you are in a health care facility, avoid touching your eyes, nose, and mouth.  Avoid touching any surface near where people have coughed or sneezed.  Stand away from sick people when they are coughing or sneezing.  Wear a mask if you cannot avoid exposure to people who are sick.  Clean all surfaces often with a disinfectant cleaner, especially if someone is sick at home or work.  Precautions of my health care team Hospitals, nursing homes, and other health care facilities take special care to try to prevent healthcare-associated pneumonia. To do this, your health care team may:  Clean their hands with soap and water or with alcohol-based hand sanitizer before and after seeing patients.  Wear gloves or masks during treatment.  Sanitize medical instruments, tubes, other equipment, and surfaces in patient rooms.  Raise (elevate) the head of your hospital bed so you are not lying flat. The head of the bed may be elevated 30 degrees or more.  Have you sit up and move around as soon as possible after surgery.  Only insert a breathing tube if needed.  Do these things for you if you have a breathing tube: ? Clean the inside of your mouth regularly. ? Remove the breathing tube as soon as it is no longer needed. Contact a health care provider if:  Your symptoms do not get better or they get worse.  Your symptoms come back after you have finished taking your antibiotics. Get help right away if:  You have trouble breathing.  You have confusion or difficulty thinking. This information is not intended to replace advice given to you by your health care provider. Make sure you discuss any questions you have with your health care provider. Document Released: 09/27/2015 Document Revised: 02/21/2016 Document Reviewed: 02/03/2016 Elsevier Interactive Patient Education  2019 [employer]   Pneumothorax A  pneumothorax is commonly called a collapsed lung. It is a condition in which air leaks from a lung and builds up between the thin layer of tissue that covers the lungs (visceral pleura) and the interior wall of the chest cavity (parietal pleura). The air gets trapped outside the lung, between the lung and the chest wall (pleural space). The air takes up space and prevents the lung from fully expanding. This condition sometimes occurs suddenly with no apparent cause. The buildup of air may be small or large. A small pneumothorax may go away on its own. A large pneumothorax will require treatment and hospitalization. What are the causes? This condition may be caused by:  Trauma and injury to the chest wall.  Surgery and other medical procedures.  A complication of an underlying lung problem, especially chronic obstructive pulmonary disease (COPD) or emphysema. Sometimes the cause of this condition is not known. What increases the risk? You are more likely to develop this condition if:  You have an underlying lung problem.  You smoke.  You are 6520-52 years old, male, tall, and underweight.  You have a personal or family history of pneumothorax.  You have an eating disorder (anorexia nervosa). This condition can also happen quickly, even in people with no history of lung problems. What are the signs or symptoms? Sometimes a pneumothorax will have no symptoms. When symptoms are present, they can include:  Chest pain.  Shortness of breath.  Increased rate of breathing.  Bluish color to your lips or skin (cyanosis). How is this diagnosed? This condition may be diagnosed by:  A medical history and physical exam.  A chest X-ray, chest CT scan, or ultrasound. How is this treated? Treatment depends on how severe your condition is. The goal of treatment is to remove the extra air and allow your lung to expand back to its normal size.  For a small pneumothorax: ? No treatment may be  needed. ? Extra oxygen is sometimes used to make it go away more quickly.  For a large pneumothorax or a pneumothorax that is causing symptoms, a procedure is done to drain the air from your lungs. To do this, a health care provider may use: ? A needle with a syringe. This is used to suck air from a pleural space where no additional leakage is taking place. ? A chest tube. This is used to suck air where there is ongoing leakage into the pleural space. The chest tube may need to remain in place for several days until the air leak has healed.  In more severe cases, surgery may be needed to repair the damage that is causing the leak.  If you have multiple pneumothorax episodes or have an air leak that will not heal, a procedure called a pleurodesis may be done. A medicine is placed in the pleural space to irritate the tissues around the lung so that the lung will stick to the chest wall, seal any leaks, and stop any buildup of air in that space. If you have an underlying lung problem, severe symptoms, or a large pneumothorax you will usually need to stay in the hospital. Follow these instructions at home: Lifestyle  Do not use any products that contain nicotine or tobacco, such as cigarettes and e-cigarettes. These are major risk factors in pneumothorax. If you need help quitting, ask your health care provider.  Do not lift anything that is heavier than 10 lb (4.5 kg), or the limit that your health care provider tells you, until he or she says that it is safe.  Avoid activities that take a lot of effort (strenuous) for as long as told by your health care provider.  Return to your normal activities as told by your health care provider. Ask your health care provider what activities are safe for you.  Do not fly in an airplane or scuba dive until your health care provider says it is okay. General instructions  Take over-the-counter and prescription medicines only as told by your health care  provider.  If a cough or pain makes it difficult for you to sleep at night, try sleeping in a semi-upright position in a recliner or by using 2 or 3 pillows.  If you had a chest tube and it was removed, ask your health care provider when you can remove the bandage (dressing). While the dressing is in place, do not allow it to get wet.  Keep all follow-up visits as told by your health care provider. This is important. Contact a health care  provider if:  You cough up thick mucus (sputum) that is yellow or green in color.  You were treated with a chest tube, and you have redness, increasing pain, or discharge at the site where it was placed. Get help right away if:  You have increasing chest pain or shortness of breath.  You have a cough that will not go away.  You begin coughing up blood.  You have pain that is getting worse or is not controlled with medicines.  The site where your chest tube was located opens up.  You feel air coming out of the site where the chest tube was placed.  You have a fever or persistent symptoms for more than 2-3 days.  You have a fever and your symptoms suddenly get worse. These symptoms may represent a serious problem that is an emergency. Do not wait to see if the symptoms will go away. Get medical help right away. Call your local emergency services (911 in the [place] Do not drive yourself to the hospital. Summary  A pneumothorax, commonly called a collapsed lung, is a condition in which air leaks from a lung and gets trapped between the lung and the chest wall (pleural space).  The buildup of air may be small or large. A small pneumothorax may go away on its own. A large pneumothorax will require treatment and hospitalization.  Treatment for this condition depends on how severe the pneumothorax is. The goal of treatment is to remove the extra air and allow the lung to expand back to its normal size. This information is not intended to replace advice  given to you by your health care provider. Make sure you discuss any questions you have with your health care provider. Document Released: 05/07/2005 Document Revised: 04/15/2017 Document Reviewed: 04/15/2017 Elsevier Interactive Patient Education  2019 [employer]   Pleural Effusion Pleural effusion is an abnormal buildup of fluid in the layers of tissue between the lungs and the inside of the chest (pleural space) The two layers of tissue that line the lungs and the inside of the chest are called pleura. Usually, there is no air in the space between the pleura, only a thin layer of fluid. Some conditions can cause a large amount of fluid to build up, which can cause the lung to collapse if untreated. A pleural effusion is usually caused by another disease that requires treatment. What are the causes? Pleural effusion can be caused by:  Heart failure.  Certain infections, such as pneumonia or tuberculosis.  Cancer.  A blood clot in the lung (pulmonary embolism).  Complications from surgery, such as from open heart surgery.  Liver disease (cirrhosis).  Kidney disease. What are the signs or symptoms? In some cases, pleural effusion may cause no symptoms. If symptoms are present, they may include:  Shortness of breath, especially when lying down.  Chest pain. This may get worse when taking a deep breath.  Fever.  Dry, long-lasting (chronic) cough.  Hiccups.  Rapid breathing. An underlying condition that is causing the pleural effusion (such as heart failure, pneumonia, blood clots, tuberculosis, or cancer) may also cause other symptoms. How is this diagnosed? This condition may be diagnosed based on:  Your symptoms and medical history.  A physical exam.  A chest X-ray.  A procedure to use a needle to remove fluid from the pleural space (thoracentesis). This fluid is tested.  Other imaging studies of the chest, such as ultrasound or CT scan. How is this  treated?  Depending on the cause of your condition, treatment may include:  Treating the underlying condition that is causing the effusion. When that condition improves, the effusion will also improve. Examples of treatment for underlying conditions include: ? Antibiotic medicines to treat an infection. ? Diuretics or other heart medicines to treat heart failure.  Thoracentesis.  Placing a thin flexible tube under your skin and into your chest to continuously drain the effusion (indwelling pleural catheter).  Surgery to remove the outer layer of tissue from the pleural space (decortication).  A procedure to put medicine into the chest cavity to seal the pleural space and prevent fluid buildup (pleurodesis).  Chemotherapy and radiation therapy, if you have cancerous (malignant) pleural effusion. These treatments are typically used to treat cancer. They kill certain cells in the body. Follow these instructions at home:  Take over-the-counter and prescription medicines only as told by your health care provider.  Ask your health care provider what activities are safe for you.  Keep track of how long you are able to do mild exercise (such as walking) before you get short of breath. Write down this information to share with your health care provider. Your ability to exercise should improve over time.  Do not use any products that contain nicotine or tobacco, such as cigarettes and e-cigarettes. If you need help quitting, ask your health care provider.  Keep all follow-up visits as told by your health care provider. This is important. Contact a health care provider if:  The amount of time that you are able to do mild exercise: ? Decreases. ? Does not improve with time.  You have a fever. Get help right away if:  You are short of breath.  You develop chest pain.  You develop a new cough. Summary  Pleural effusion is an abnormal buildup of fluid in the layers of tissue between the  lungs and the inside of the chest.  Pleural effusion can have many causes, including heart failure, pulmonary embolism, infections, or cancer.  Symptoms of pleural effusion can include shortness of breath, chest pain, fever, long-lasting (chronic) cough, hiccups, or rapid breathing.  Diagnosis often involves making images of the chest (such as with ultrasound or X-ray) and removing fluid (thoracentesis) to send for testing.  Treatment for pleural effusion depends on what underlying condition is causing it. This information is not intended to replace advice given to you by your health care provider. Make sure you discuss any questions you have with your health care provider. Document Released: 05/07/2005 Document Revised: 01/10/2017 Document Reviewed: 01/10/2017 Elsevier Interactive Patient Education  2019 [employer].

## 2018-06-27 NOTE — Progress Notes (Signed)
Patient discharged home with wife, discharge instructions given and explained to patient/wife and they verbalized understanding, patient denies any pain/distress, no wound noted, skin intact.  accompanied home by wife, transported to the car by staff via wheelchair.

## 2018-06-30 LAB — CULTURE, BLOOD (ROUTINE X 2)
CULTURE: NO GROWTH
Culture: NO GROWTH
Special Requests: ADEQUATE

## 2018-07-02 DIAGNOSIS — J3089 Other allergic rhinitis: Secondary | ICD-10-CM | POA: Diagnosis not present

## 2018-07-02 DIAGNOSIS — J3081 Allergic rhinitis due to animal (cat) (dog) hair and dander: Secondary | ICD-10-CM | POA: Diagnosis not present

## 2018-07-02 DIAGNOSIS — J301 Allergic rhinitis due to pollen: Secondary | ICD-10-CM | POA: Diagnosis not present

## 2018-07-03 ENCOUNTER — Other Ambulatory Visit: Payer: Self-pay | Admitting: Family Medicine

## 2018-07-03 ENCOUNTER — Ambulatory Visit
Admission: RE | Admit: 2018-07-03 | Discharge: 2018-07-03 | Disposition: A | Discharge status: Home / Self Care | Payer: BLUE CROSS/BLUE SHIELD | Source: Ambulatory Visit | Attending: Family Medicine | Admitting: Family Medicine

## 2018-07-03 DIAGNOSIS — J189 Pneumonia, unspecified organism: Secondary | ICD-10-CM

## 2018-07-03 DIAGNOSIS — J939 Pneumothorax, unspecified: Secondary | ICD-10-CM | POA: Diagnosis not present

## 2018-07-03 DIAGNOSIS — I10 Essential (primary) hypertension: Secondary | ICD-10-CM | POA: Diagnosis not present

## 2018-07-03 DIAGNOSIS — K219 Gastro-esophageal reflux disease without esophagitis: Secondary | ICD-10-CM | POA: Diagnosis not present

## 2018-07-03 DIAGNOSIS — E7800 Pure hypercholesterolemia, unspecified: Secondary | ICD-10-CM | POA: Diagnosis not present

## 2018-07-04 DIAGNOSIS — J301 Allergic rhinitis due to pollen: Secondary | ICD-10-CM | POA: Diagnosis not present

## 2018-07-04 DIAGNOSIS — J3081 Allergic rhinitis due to animal (cat) (dog) hair and dander: Secondary | ICD-10-CM | POA: Diagnosis not present

## 2018-07-04 DIAGNOSIS — J3089 Other allergic rhinitis: Secondary | ICD-10-CM | POA: Diagnosis not present

## 2018-07-08 DIAGNOSIS — J3081 Allergic rhinitis due to animal (cat) (dog) hair and dander: Secondary | ICD-10-CM | POA: Diagnosis not present

## 2018-07-08 DIAGNOSIS — J301 Allergic rhinitis due to pollen: Secondary | ICD-10-CM | POA: Diagnosis not present

## 2018-07-08 DIAGNOSIS — J3089 Other allergic rhinitis: Secondary | ICD-10-CM | POA: Diagnosis not present

## 2018-07-11 DIAGNOSIS — J301 Allergic rhinitis due to pollen: Secondary | ICD-10-CM | POA: Diagnosis not present

## 2018-07-11 DIAGNOSIS — J3081 Allergic rhinitis due to animal (cat) (dog) hair and dander: Secondary | ICD-10-CM | POA: Diagnosis not present

## 2018-07-11 DIAGNOSIS — J3089 Other allergic rhinitis: Secondary | ICD-10-CM | POA: Diagnosis not present

## 2018-07-16 DIAGNOSIS — J3089 Other allergic rhinitis: Secondary | ICD-10-CM | POA: Diagnosis not present

## 2018-07-16 DIAGNOSIS — J301 Allergic rhinitis due to pollen: Secondary | ICD-10-CM | POA: Diagnosis not present

## 2018-07-16 DIAGNOSIS — J3081 Allergic rhinitis due to animal (cat) (dog) hair and dander: Secondary | ICD-10-CM | POA: Diagnosis not present

## 2018-07-18 DIAGNOSIS — J301 Allergic rhinitis due to pollen: Secondary | ICD-10-CM | POA: Diagnosis not present

## 2018-07-18 DIAGNOSIS — J3089 Other allergic rhinitis: Secondary | ICD-10-CM | POA: Diagnosis not present

## 2018-07-18 DIAGNOSIS — J3081 Allergic rhinitis due to animal (cat) (dog) hair and dander: Secondary | ICD-10-CM | POA: Diagnosis not present

## 2018-07-22 DIAGNOSIS — J3081 Allergic rhinitis due to animal (cat) (dog) hair and dander: Secondary | ICD-10-CM | POA: Diagnosis not present

## 2018-07-22 DIAGNOSIS — J3089 Other allergic rhinitis: Secondary | ICD-10-CM | POA: Diagnosis not present

## 2018-07-22 DIAGNOSIS — J301 Allergic rhinitis due to pollen: Secondary | ICD-10-CM | POA: Diagnosis not present

## 2018-07-22 DIAGNOSIS — D72829 Elevated white blood cell count, unspecified: Secondary | ICD-10-CM | POA: Diagnosis not present

## 2018-07-24 DIAGNOSIS — J301 Allergic rhinitis due to pollen: Secondary | ICD-10-CM | POA: Diagnosis not present

## 2018-07-24 DIAGNOSIS — J3089 Other allergic rhinitis: Secondary | ICD-10-CM | POA: Diagnosis not present

## 2018-07-24 DIAGNOSIS — J3081 Allergic rhinitis due to animal (cat) (dog) hair and dander: Secondary | ICD-10-CM | POA: Diagnosis not present

## 2018-07-28 DIAGNOSIS — J301 Allergic rhinitis due to pollen: Secondary | ICD-10-CM | POA: Diagnosis not present

## 2018-07-28 DIAGNOSIS — J3089 Other allergic rhinitis: Secondary | ICD-10-CM | POA: Diagnosis not present

## 2018-07-28 DIAGNOSIS — J3081 Allergic rhinitis due to animal (cat) (dog) hair and dander: Secondary | ICD-10-CM | POA: Diagnosis not present

## 2018-07-30 DIAGNOSIS — J301 Allergic rhinitis due to pollen: Secondary | ICD-10-CM | POA: Diagnosis not present

## 2018-07-30 DIAGNOSIS — J3089 Other allergic rhinitis: Secondary | ICD-10-CM | POA: Diagnosis not present

## 2018-07-30 DIAGNOSIS — J3081 Allergic rhinitis due to animal (cat) (dog) hair and dander: Secondary | ICD-10-CM | POA: Diagnosis not present

## 2018-08-05 DIAGNOSIS — J3081 Allergic rhinitis due to animal (cat) (dog) hair and dander: Secondary | ICD-10-CM | POA: Diagnosis not present

## 2018-08-05 DIAGNOSIS — J3089 Other allergic rhinitis: Secondary | ICD-10-CM | POA: Diagnosis not present

## 2018-08-05 DIAGNOSIS — J301 Allergic rhinitis due to pollen: Secondary | ICD-10-CM | POA: Diagnosis not present

## 2018-08-07 DIAGNOSIS — J301 Allergic rhinitis due to pollen: Secondary | ICD-10-CM | POA: Diagnosis not present

## 2018-08-07 DIAGNOSIS — J3089 Other allergic rhinitis: Secondary | ICD-10-CM | POA: Diagnosis not present

## 2018-08-07 DIAGNOSIS — J3081 Allergic rhinitis due to animal (cat) (dog) hair and dander: Secondary | ICD-10-CM | POA: Diagnosis not present

## 2018-08-12 DIAGNOSIS — J3081 Allergic rhinitis due to animal (cat) (dog) hair and dander: Secondary | ICD-10-CM | POA: Diagnosis not present

## 2018-08-12 DIAGNOSIS — J301 Allergic rhinitis due to pollen: Secondary | ICD-10-CM | POA: Diagnosis not present

## 2018-08-12 DIAGNOSIS — J3089 Other allergic rhinitis: Secondary | ICD-10-CM | POA: Diagnosis not present

## 2018-08-18 DIAGNOSIS — J3081 Allergic rhinitis due to animal (cat) (dog) hair and dander: Secondary | ICD-10-CM | POA: Diagnosis not present

## 2018-08-18 DIAGNOSIS — J3089 Other allergic rhinitis: Secondary | ICD-10-CM | POA: Diagnosis not present

## 2018-08-18 DIAGNOSIS — J301 Allergic rhinitis due to pollen: Secondary | ICD-10-CM | POA: Diagnosis not present

## 2018-10-01 DIAGNOSIS — E162 Hypoglycemia, unspecified: Secondary | ICD-10-CM | POA: Diagnosis not present

## 2018-10-01 DIAGNOSIS — R42 Dizziness and giddiness: Secondary | ICD-10-CM | POA: Diagnosis not present

## 2018-10-14 DIAGNOSIS — R42 Dizziness and giddiness: Secondary | ICD-10-CM | POA: Diagnosis not present

## 2018-10-14 DIAGNOSIS — E162 Hypoglycemia, unspecified: Secondary | ICD-10-CM | POA: Diagnosis not present

## 2018-11-05 DIAGNOSIS — E161 Other hypoglycemia: Secondary | ICD-10-CM | POA: Insufficient documentation

## 2018-11-05 DIAGNOSIS — R7303 Prediabetes: Secondary | ICD-10-CM | POA: Diagnosis not present

## 2018-11-05 DIAGNOSIS — Z9889 Other specified postprocedural states: Secondary | ICD-10-CM | POA: Diagnosis not present

## 2018-11-05 DIAGNOSIS — Z8719 Personal history of other diseases of the digestive system: Secondary | ICD-10-CM | POA: Diagnosis not present

## 2018-12-29 ENCOUNTER — Other Ambulatory Visit: Payer: Self-pay

## 2018-12-29 ENCOUNTER — Ambulatory Visit (INDEPENDENT_AMBULATORY_CARE_PROVIDER_SITE_OTHER): Payer: BC Managed Care – PPO

## 2018-12-29 ENCOUNTER — Ambulatory Visit: Payer: BLUE CROSS/BLUE SHIELD | Admitting: Podiatry

## 2018-12-29 ENCOUNTER — Other Ambulatory Visit: Payer: Self-pay | Admitting: Podiatry

## 2018-12-29 VITALS — Temp 97.7°F

## 2018-12-29 DIAGNOSIS — M21619 Bunion of unspecified foot: Secondary | ICD-10-CM

## 2018-12-29 DIAGNOSIS — M79671 Pain in right foot: Secondary | ICD-10-CM

## 2018-12-29 DIAGNOSIS — M21612 Bunion of left foot: Secondary | ICD-10-CM | POA: Diagnosis not present

## 2018-12-29 DIAGNOSIS — M79672 Pain in left foot: Secondary | ICD-10-CM

## 2018-12-29 NOTE — Patient Instructions (Signed)
Bunion  A bunion is a bump on the base of the big toe that forms when the bones of the big toe joint move out of position. Bunions may be small at first, but they often get larger over time. They can make walking painful. What are the causes? A bunion may be caused by:  Wearing narrow or pointed shoes that force the big toe to press against the other toes.  Abnormal foot development that causes the foot to roll inward (pronate).  Changes in the foot that are caused by certain diseases, such as rheumatoid arthritis or polio.  A foot injury. What increases the risk? The following factors may make you more likely to develop this condition:  Wearing shoes that squeeze the toes together.  Having certain diseases, such as: ? Rheumatoid arthritis. ? Polio. ? Cerebral palsy.  Having family members who have bunions.  Being born with a foot deformity, such as flat feet or low arches.  Doing activities that put a lot of pressure on the feet, such as ballet dancing. What are the signs or symptoms? The main symptom of a bunion is a noticeable bump on the big toe. Other symptoms may include:  Pain.  Swelling around the big toe.  Redness and inflammation.  Thick or hardened skin on the big toe or between the toes.  Stiffness or loss of motion in the big toe.  Trouble with walking. How is this diagnosed? A bunion may be diagnosed based on your symptoms, medical history, and activities. You may have tests, such as:  X-rays. These allow your health care provider to check the position of the bones in your foot and look for damage to your joint. They also help your health care provider determine the severity of your bunion and the best way to treat it.  Joint aspiration. In this test, a sample of fluid is removed from the toe joint. This test may be done if you are in a lot of pain. It helps rule out diseases that cause painful swelling of the joints, such as arthritis. How is this  treated? Treatment depends on the severity of your symptoms. The goal of treatment is to relieve symptoms and prevent the bunion from getting worse. Your health care provider may recommend:  Wearing shoes that have a wide toe box.  Using bunion pads to cushion the affected area.  Taping your toes together to keep them in a normal position.  Placing a device inside your shoe (orthotics) to help reduce pressure on your toe joint.  Taking medicine to ease pain, inflammation, and swelling.  Applying heat or ice to the affected area.  Doing stretching exercises.  Surgery to remove scar tissue and move the toes back into their normal position. This treatment is rare. Follow these instructions at home: Managing pain, stiffness, and swelling   If directed, put ice on the painful area: ? Put ice in a plastic bag. ? Place a towel between your skin and the bag. ? Leave the ice on for 20 minutes, 2-3 times a day. Activity   If directed, apply heat to the affected area before you exercise. Use the heat source that your health care provider recommends, such as a moist heat pack or a heating pad. ? Place a towel between your skin and the heat source. ? Leave the heat on for 20-30 minutes. ? Remove the heat if your skin turns bright red. This is especially important if you are unable to feel pain,   heat, or cold. You may have a greater risk of getting burned.  Do exercises as told by your health care provider. General instructions  Support your toe joint with proper footwear, shoe padding, or taping as told by your health care provider.  Take over-the-counter and prescription medicines only as told by your health care provider.  Keep all follow-up visits as told by your health care provider. This is important. Contact a health care provider if your symptoms:  Get worse.  Do not improve in 2 weeks. Get help right away if you have:  Severe pain and trouble with walking. Summary  A  bunion is a bump on the base of the big toe that forms when the bones of the big toe joint move out of position.  Bunions can make walking painful.  Treatment depends on the severity of your symptoms.  Support your toe joint with proper footwear, shoe padding, or taping as told by your health care provider. This information is not intended to replace advice given to you by your health care provider. Make sure you discuss any questions you have with your health care provider. Document Released: 05/07/2005 Document Revised: 11/11/2017 Document Reviewed: 09/17/2017 Elsevier Patient Education  2020 [employer]  

## 2018-12-31 DIAGNOSIS — M21619 Bunion of unspecified foot: Secondary | ICD-10-CM | POA: Insufficient documentation

## 2018-12-31 NOTE — Progress Notes (Signed)
Subjective:   Patient ID: Jeremy DurhamBrian T Telleria, male   DOB: 53 y.o.   MRN: [id number]   HPI 53 year old male presents the office concerns of pain to left on the area of bunion.  Is been there for last 5 years but is been getting worse.  He states it is becoming more painful consistently.  He is tried shoe modifications, offloading and significant improvement.  Also describes sharp pain on the bunion site at times.  No radiating pain.  No weakness or falls.   Review of Systems  All other systems reviewed and are negative.  Past Medical History:  Diagnosis Date  . Anxiety   . Complication of anesthesia    Difficult to wake up after anesthesia per wife  . Depression   . GERD (gastroesophageal reflux disease) 03-27-11   tx. Nexium  . Headache   . Hiatal hernia s/p robotic repair & Nissen fundoplication 06/20/2018 06/20/2018  . History of hiatal hernia   . History of inguinal hernia repair    BIH  . History of Nissen fundoplication 06/20/2018 06/20/2018  . Hyperlipidemia   . Hypertension   . Seasonal allergies   . Squamous cell carcinoma in situ (SCCIS) of skin of face     Past Surgical History:  Procedure Laterality Date  . ESOPHAGEAL MANOMETRY N/A 07/10/2017   Procedure: ESOPHAGEAL MANOMETRY (EM);  Surgeon: Jeani HawkingHung, Patrick, MD;  Location: [facility];  Service: Endoscopy;  Laterality: N/A;  . HERNIA REPAIR  06/21/2018  . INGUINAL HERNIA REPAIR  04/02/2011   Procedure: LAPAROSCOPIC BILATERAL INGUINAL HERNIA REPAIR;  Surgeon: Adolph Pollackodd J Rosenbower, MD;  Location: [facility];  Service: General;  Laterality: Bilateral;  laparoscopic repair bilateral inguinal hernia  . KNEE ARTHROSCOPY Left 10/12/2015   Procedure: LEFT ARTHROSCOPY KNEE WITH MENISCAL DEBRIDEMENT;  Surgeon: Ollen GrossFrank Aluisio, MD;  Location: [facility];  Service: Orthopedics;  Laterality: Left;  . KNEE SURGERY  2009   right  . NOSE SURGERY  200/2003/2008/2012  . PH IMPEDANCE STUDY N/A 07/10/2017   Procedure: PH IMPEDANCE STUDY;  Surgeon: Jeani HawkingHung,  Patrick, MD;  Location: [facility];  Service: Endoscopy;  Laterality: N/A;  . SHOULDER SURGERY  2004   left  . VARICOCELE EXCISION  2000  . WRIST SURGERY Left 2013  . XI ROBOTIC ASSISTED PARASTOMAL HERNIA REPAIR N/A 06/20/2018   Procedure: XI ROBOTIC REPAIR OF PARAESOPHAGEAL  HIATAL HERNIA WITH FUNDOPLICATION, WITH MESH;  Surgeon: [name], Steven, MD;  Location: [facility];  Service: General;  Laterality: N/A;     Current Outpatient Medications:  .  Glucagon (BAQSIMI TWO PACK) 3 MG/DOSE POWD, Place into the nose., Disp: , Rfl:  .  AMBULATORY NON FORMULARY MEDICATION, Inject into the muscle 2 (two) times a week. Medication Name:allergy shots, Disp: , Rfl:  .  atorvastatin (LIPITOR) 40 MG tablet, Take 40 mg by mouth daily., Disp: , Rfl:  .  buPROPion (WELLBUTRIN XL) 150 MG 24 hr tablet, Take 150 mg by mouth daily., Disp: , Rfl:  .  docusate sodium (COLACE) 100 MG capsule, Take 1 capsule (100 mg total) by mouth daily., Disp: 10 capsule, Rfl: 0 .  EPINEPHrine (EPIPEN 2-PAK) 0.3 mg/0.3 mL IJ SOAJ injection, Inject 0.3 mg into the muscle once as needed (For anaphylaxis.). , Disp: , Rfl:  .  gabapentin (NEURONTIN) 300 MG capsule, Take 2 capsules (600 mg total) by mouth 3 (three) times daily., Disp: 40 capsule, Rfl: 2 .  lisinopril-hydrochlorothiazide (PRINZIDE,ZESTORETIC) 10-12.5 MG tablet, Take 1 tablet by mouth daily., Disp: , Rfl:  .  losartan-hydrochlorothiazide (HYZAAR) 50-12.5 MG tablet, Take 1 tablet by mouth daily., Disp: , Rfl:  .  methocarbamol (ROBAXIN) 750 MG tablet, Take 1 tablet (750 mg total) by mouth 4 (four) times daily as needed (use for muscle cramps/pain)., Disp: 20 tablet, Rfl: 2 .  oxyCODONE (OXY IR/ROXICODONE) 5 MG immediate release tablet, Take 5-10 mg by mouth every 6 (six) hours as needed for pain., Disp: , Rfl:  .  pantoprazole (PROTONIX) 40 MG tablet, Take 40 mg by mouth 2 (two) times daily., Disp: , Rfl:  .  pravastatin (PRAVACHOL) 40 MG tablet, Take by mouth., Disp: , Rfl:   .  pregabalin (LYRICA) 150 MG capsule, TAKE 1 CAPSULE BY MOUTH THREE TIMES A DAY, Disp: , Rfl:   Allergies  Allergen Reactions  . Other Anaphylaxis    Patient can not eat eggs, but he can receive egg derived products.  . Metaxalone Rash  . Amoxil [Amoxicillin Trihydrate] Diarrhea, Itching and Other (See Comments)    Has patient had a PCN reaction causing immediate rash, facial/tongue/throat swelling, SOB or lightheadedness with hypotension: no Has patient had a PCN reaction causing severe rash involving mucus membranes or skin necrosis: no Has patient had a PCN reaction that required hospitalization no Has patient had a PCN reaction occurring within the last 10 years: yes If all of the above answers are "NO", then may proceed with Cephalosporin use.  . Banana Itching  . Skelaxin Itching         Objective:  Physical Exam  General: AAO x3, NAD  Dermatological: Skin is warm, dry and supple bilateral. Nails x 10 are well manicured; remaining integument appears unremarkable at this time. There are no open sores, no preulcerative lesions, no rash or signs of infection present.  Vascular: Dorsalis Pedis artery and Posterior Tibial artery pedal pulses are 2/4 bilateral with immedate capillary fill time. Pedal hair growth present. No varicosities and no lower extremity edema present bilateral. There is no pain with calf compression, swelling, warmth, erythema.   Neruologic: Grossly intact via light touch bilateral. Vibratory intact via tuning fork bilateral. Protective threshold with Semmes Wienstein monofilament intact to all pedal sites bilateral.  Musculoskeletal: Moderate bunion deformities present on the left side and there is to some internal bunion site.  There is no pain or crepitation with MPJ range of motion there is no first ray hypermobility present.  No other areas of tenderness identified at this time.  Muscular strength 5/5 in all groups tested bilateral.  Gait: Unassisted,  Nonantalgic.       Assessment:   Left foot bunion deformity    Plan:  -Treatment options discussed including all alternatives, risks, and complications -Etiology of symptoms were discussed -X-rays were obtained and reviewed with the patient.  Moderate bunion deformity is present.  No evidence acute fracture. -We discussed conservative as well as surgical treatment.  He attempted conservative care including shoe modification offloading padding and pain to become consistently discussed natural progression of the bunion.  Antibiotics consider surgical intervention.  We discussed also bunionectomy with screw fixation.  We discussed the surgery as well as postoperative course.  He is to consider his options are to check insurance for him.  Trula Slade DPM

## 2019-01-16 ENCOUNTER — Ambulatory Visit: Payer: BC Managed Care – PPO | Admitting: Podiatry

## 2019-01-16 ENCOUNTER — Other Ambulatory Visit: Payer: Self-pay

## 2019-01-16 ENCOUNTER — Encounter: Payer: Self-pay | Admitting: Podiatry

## 2019-01-16 DIAGNOSIS — M21962 Unspecified acquired deformity of left lower leg: Secondary | ICD-10-CM

## 2019-01-16 DIAGNOSIS — M779 Enthesopathy, unspecified: Secondary | ICD-10-CM | POA: Diagnosis not present

## 2019-01-16 DIAGNOSIS — M2012 Hallux valgus (acquired), left foot: Secondary | ICD-10-CM

## 2019-01-16 NOTE — Progress Notes (Signed)
Subjective: 53 year old male presents the office for surgical consultation given painful bunion on the left foot.  He states the bunion is continuing to hurt more so proceed with surgery.  He also states he gets point of all of his foot pointing to submetatarsal 2.  He is attempted conservative care including, but not limited to shoe modifications and offloading and padding of the instep improvement. Denies any systemic complaints such as fevers, chills, nausea, vomiting. No acute changes since last appointment, and no other complaints at this time.   Objective: AAO x3, NAD DP/PT pulses palpable bilaterally, CRT less than 3 seconds Moderate bunion deformities present.  No pressure hypermobility.  No crepitation with range of motion.  Minimal restriction with MPJ range of motion.  Tenderness submetatarsal 2 on the left foot as well with prominent metatarsal head.  No callus formation present.  No open lesions or pre-ulcerative lesions.  No pain with calf compression, swelling, warmth, erythema  Assessment: Left foot bunion deformity, elongated second metatarsal/capsulitis  Plan: -All treatment options discussed with the patient including all alternatives, risks, complications.  -Reviewed the x-rays with him.  We discussed with conservative as well as surgical care.  Time he wishes to proceed with surgical intervention.  Discussed first metatarsal osteotomy, second metatarsal osteotomy and screw fixation.   -The incision placement as well as the postoperative course was discussed with the patient. I discussed risks of the surgery which include, but not limited to, infection, bleeding, pain, swelling, need for further surgery, delayed or nonhealing, painful or ugly scar, numbness or sensation changes, over/under correction, recurrence, transfer lesions, further deformity, hardware failure, DVT/PE, loss of toe/foot. Patient understands these risks and wishes to proceed with surgery. The surgical consent  was reviewed with the patient all 3 pages were signed. No promises or guarantees were given to the outcome of the procedure. All questions were answered to the best of my ability. Before the surgery the patient was encouraged to call the office if there is any further questions. The surgery will be performed at the [facility] on an outpatient basis. -CAM boot dispensed for postop use -Patient encouraged to call the office with any questions, concerns, change in symptoms.   Trula Slade DPM

## 2019-01-16 NOTE — Patient Instructions (Signed)
Pre-Operative Instructions  Congratulations, you have decided to take an important step towards improving your quality of life.  You can be assured that the doctors and staff at [facility] will be with you every step of the way.  Here are some important things you should know:  1. Plan to be at the surgery center/hospital at least 1 (one) hour prior to your scheduled time, unless otherwise directed by the surgical center/hospital staff.  You must have a responsible adult accompany you, remain during the surgery and drive you home.  Make sure you have directions to the surgical center/hospital to ensure you arrive on time. 2. If you are having surgery at [facility] or [facility] hospitals, you will need a copy of your medical history and physical form from your family physician within one month prior to the date of surgery. We will give you a form for your primary physician to complete.  3. We make every effort to accommodate the date you request for surgery.  However, there are times where surgery dates or times have to be moved.  We will contact you as soon as possible if a change in schedule is required.   4. No aspirin/ibuprofen for one week before surgery.  If you are on aspirin, any non-steroidal anti-inflammatory medications (Mobic, Aleve, Ibuprofen) should not be taken seven (7) days prior to your surgery.  You make take Tylenol for pain prior to surgery.  5. Medications - If you are taking daily heart and blood pressure medications, seizure, reflux, allergy, asthma, anxiety, pain or diabetes medications, make sure you notify the surgery center/hospital before the day of surgery so they can tell you which medications you should take or avoid the day of surgery. 6. No food or drink after midnight the night before surgery unless directed otherwise by surgical center/hospital staff. 7. No alcoholic beverages 24-hours prior to surgery.  No smoking 24-hours prior or 24-hours after  surgery. 8. Wear loose pants or shorts. They should be loose enough to fit over bandages, boots, and casts. 9. Don't wear slip-on shoes. Sneakers are preferred. 10. Bring your boot with you to the surgery center/hospital.  Also bring crutches or a walker if your physician has prescribed it for you.  If you do not have this equipment, it will be provided for you after surgery. 11. If you have not been contacted by the surgery center/hospital by the day before your surgery, call to confirm the date and time of your surgery. 12. Leave-time from work may vary depending on the type of surgery you have.  Appropriate arrangements should be made prior to surgery with your employer. 13. Prescriptions will be provided immediately following surgery by your doctor.  Fill these as soon as possible after surgery and take the medication as directed. Pain medications will not be refilled on weekends and must be approved by the doctor. 14. Remove nail polish on the operative foot and avoid getting pedicures prior to surgery. 15. Wash the night before surgery.  The night before surgery wash the foot and leg well with water and the antibacterial soap provided. Be sure to pay special attention to beneath the toenails and in between the toes.  Wash for at least three (3) minutes. Rinse thoroughly with water and dry well with a towel.  Perform this wash unless told not to do so by your physician.  Enclosed: 1 Ice pack (please put in freezer the night before surgery)   1 Hibiclens skin cleaner     Pre-op instructions  If you have any questions regarding the instructions, please do not hesitate to call our office.  [place]: [address], [place], [place] [address] -- [phone]  [place]: [address] [place], [place] [address] -- [phone]  [place]: [address]  [place], [place] [address] -- [phone]  [place]: [address], [address], [place], [place] [address] -- [phone]  Website:  [email or web address] 

## 2019-01-20 ENCOUNTER — Telehealth: Payer: Self-pay | Admitting: *Deleted

## 2019-01-20 NOTE — Telephone Encounter (Signed)
"  My husband is scheduled to have surgery on October 21, I think.  He was given a quote that his insurance will pay 80% and that he would be responsible for the rest.  I called [employer] and [employer].  He recently had a surgery.  So, he has paid all of the out of pocket.  So, they said that his surgery should be covered at 100%.  They said we needed to make sure the insurance company is in-network.  If so, they will be covered at 100% as well.  Do you know what anesthesia companies that they use at the surgical center?"  They use [facility].  "That's the only company that they use?"  Yes, it is to my knowledge.  "Do you have their phone number?"  Yes, it is [phone].  Your husband's procedure will take about 1.5 hours.

## 2019-01-28 ENCOUNTER — Encounter: Payer: Self-pay | Admitting: Podiatry

## 2019-02-18 DIAGNOSIS — M79676 Pain in unspecified toe(s): Secondary | ICD-10-CM

## 2019-02-19 ENCOUNTER — Telehealth: Payer: Self-pay | Admitting: *Deleted

## 2019-02-19 NOTE — Telephone Encounter (Signed)
Called and spoke with West Pugh from [facility] and there is no prior authorization or pre-cert needed for the procedure codes 28296, 28308 for M2010, [phone] and the reference number [id number]. Lattie Haw

## 2019-03-03 ENCOUNTER — Telehealth: Payer: Self-pay | Admitting: *Deleted

## 2019-03-03 NOTE — Telephone Encounter (Signed)
"  I'm a patient of Dr. Jacqualyn Posey.  I'm supposed to have surgery next Wednesday.  I haven't heard anything about a time and all.  I was just wondering who I need to talk to, to see if that's been arranged or whatever."

## 2019-03-04 NOTE — Telephone Encounter (Signed)
I left Jeremy Diaz a message that someone from the surgical center would give him a call a day or two before his surgery date and will give him his arrival time.  I also informed him that he can call the scheduler at the facility and ask as well.  The surgical center's phone number is located on the back of the brochure that we gave him.

## 2019-03-11 ENCOUNTER — Encounter: Payer: Self-pay | Admitting: Podiatry

## 2019-03-11 ENCOUNTER — Other Ambulatory Visit: Payer: Self-pay | Admitting: Podiatry

## 2019-03-11 DIAGNOSIS — M2012 Hallux valgus (acquired), left foot: Secondary | ICD-10-CM | POA: Diagnosis not present

## 2019-03-11 DIAGNOSIS — M25572 Pain in left ankle and joints of left foot: Secondary | ICD-10-CM | POA: Diagnosis not present

## 2019-03-11 DIAGNOSIS — M21612 Bunion of left foot: Secondary | ICD-10-CM | POA: Diagnosis not present

## 2019-03-11 DIAGNOSIS — I10 Essential (primary) hypertension: Secondary | ICD-10-CM | POA: Diagnosis not present

## 2019-03-11 DIAGNOSIS — M21549 Acquired clubfoot, unspecified foot: Secondary | ICD-10-CM | POA: Diagnosis not present

## 2019-03-11 DIAGNOSIS — M7742 Metatarsalgia, left foot: Secondary | ICD-10-CM | POA: Diagnosis not present

## 2019-03-11 DIAGNOSIS — M21542 Acquired clubfoot, left foot: Secondary | ICD-10-CM | POA: Diagnosis not present

## 2019-03-11 MED ORDER — PROMETHAZINE HCL 25 MG PO TABS: 25.0000 mg | ORAL_TABLET | Freq: Three times a day (TID) | ORAL | 0 refills | Status: DC | PRN

## 2019-03-11 MED ORDER — OXYCODONE-ACETAMINOPHEN 5-325 MG PO TABS: 1.0000 | ORAL_TABLET | Freq: Four times a day (QID) | ORAL | 0 refills | Status: DC | PRN

## 2019-03-11 MED ORDER — CLINDAMYCIN HCL 300 MG PO CAPS: 300.0000 mg | ORAL_CAPSULE | Freq: Three times a day (TID) | ORAL | 0 refills | Status: DC

## 2019-03-11 NOTE — Progress Notes (Signed)
Postop medications sent to pharmacy  Reviewed the surgery and postop course. No further questions. Surgical consent signed.

## 2019-03-13 ENCOUNTER — Telehealth: Payer: Self-pay | Admitting: *Deleted

## 2019-03-13 NOTE — Telephone Encounter (Signed)
Called and spoke with the patient and patient states he is doing fine and the nerve block has wore off and does hurt a little and there is not any fever or chills and no nausea and I took my pain medicine at 1 pm today and has been icing and elevating and taken the boot off some and I stated to make sure patient puts the boot on when getting up and I stated to call the [place] office if any concerns or questions at [phone]. Jeremy Diaz

## 2019-03-19 ENCOUNTER — Encounter: Payer: BC Managed Care – PPO | Admitting: Podiatry

## 2019-03-20 ENCOUNTER — Ambulatory Visit (INDEPENDENT_AMBULATORY_CARE_PROVIDER_SITE_OTHER): Payer: BC Managed Care – PPO

## 2019-03-20 ENCOUNTER — Encounter (INDEPENDENT_AMBULATORY_CARE_PROVIDER_SITE_OTHER): Payer: Self-pay

## 2019-03-20 ENCOUNTER — Ambulatory Visit (INDEPENDENT_AMBULATORY_CARE_PROVIDER_SITE_OTHER): Payer: BC Managed Care – PPO | Admitting: Podiatry

## 2019-03-20 ENCOUNTER — Other Ambulatory Visit: Payer: Self-pay

## 2019-03-20 ENCOUNTER — Encounter: Payer: Self-pay | Admitting: Podiatry

## 2019-03-20 DIAGNOSIS — M2012 Hallux valgus (acquired), left foot: Secondary | ICD-10-CM | POA: Diagnosis not present

## 2019-03-20 DIAGNOSIS — M21962 Unspecified acquired deformity of left lower leg: Secondary | ICD-10-CM

## 2019-03-20 MED ORDER — OXYCODONE-ACETAMINOPHEN 5-325 MG PO TABS: 1.0000 | ORAL_TABLET | Freq: Four times a day (QID) | ORAL | 0 refills | Status: DC | PRN

## 2019-03-20 NOTE — Progress Notes (Signed)
Subjective: Jeremy Diaz is a 53 y.o. is seen today in office s/p left foot Austin and 2nd metatarsal osteotomy preformed on 03/11/2019.  He states his pain is controlled with pain medicine.  Takes Percocet 1 tablet every 6 hours and ibuprofen intermittently.  Still in the cam boot and states he has not been putting much weight on his foot.  Denies any systemic complaints such as fevers, chills, nausea, vomiting. No calf pain, chest pain, shortness of breath.   Objective: General: No acute distress, AAOx3  DP/PT pulses palpable 2/4, CRT < 3 sec to all digits.  Protective sensation intact. Motor function intact.  LEFT foot: Incision is well coapted without any evidence of dehiscence. There is no surrounding erythema, ascending cellulitis, fluctuance, crepitus, malodor, drainage/purulence. There is mild edema around the surgical site.  Mild ecchymosis is present there is mild pain along the surgical site.  No other areas of tenderness to bilateral lower extremities.  No other open lesions or pre-ulcerative lesions.  No pain with calf compression, swelling, warmth, erythema.   Assessment and Plan:  Status post left foot surgery, doing well with no complications   -Treatment options discussed including all alternatives, risks, and complications -X-rays were obtained reviewed.  Hardware intact.  No evidence of acute fracture. -Antibiotic ointment and a dressing applied.  Keep the dressing clean, dry, intact. -Is in a cam boot at all times.  Weightbearing as tolerated. -Ice/elevation -Pain medication as needed-refilled today -Monitor for any clinical signs or symptoms of infection and DVT/PE and directed to call the office immediately should any occur or go to the ER. -Follow-up as scheduled or sooner if any problems arise. In the meantime, encouraged to call the office with any questions, concerns, change in symptoms.   Celesta Gentile, DPM

## 2019-03-26 ENCOUNTER — Other Ambulatory Visit: Payer: Self-pay

## 2019-03-26 ENCOUNTER — Encounter: Payer: Self-pay | Admitting: Podiatry

## 2019-03-26 ENCOUNTER — Ambulatory Visit (INDEPENDENT_AMBULATORY_CARE_PROVIDER_SITE_OTHER): Payer: Self-pay | Admitting: Podiatry

## 2019-03-26 DIAGNOSIS — M2012 Hallux valgus (acquired), left foot: Secondary | ICD-10-CM

## 2019-03-26 DIAGNOSIS — M21962 Unspecified acquired deformity of left lower leg: Secondary | ICD-10-CM

## 2019-04-02 ENCOUNTER — Ambulatory Visit: Payer: BC Managed Care – PPO | Admitting: Podiatry

## 2019-04-02 ENCOUNTER — Other Ambulatory Visit: Payer: Self-pay

## 2019-04-02 DIAGNOSIS — M21962 Unspecified acquired deformity of left lower leg: Secondary | ICD-10-CM

## 2019-04-02 DIAGNOSIS — M2012 Hallux valgus (acquired), left foot: Secondary | ICD-10-CM

## 2019-04-02 NOTE — Progress Notes (Signed)
Subjective: Jeremy Diaz is a 53 y.o. is seen today in office s/p left foot Austin and 2nd metatarsal osteotomy preformed on 03/11/2019.  He is doing well he still taking pain medicine.  Some discomfort.  He has been wearing the cam boot during limited activity.  He states he has noticed the wound on his foot. Denies any systemic complaints such as fevers, chills, nausea, vomiting. No calf pain, chest pain, shortness of breath.   Objective: General: No acute distress, AAOx3  DP/PT pulses palpable 2/4, CRT < 3 sec to all digits.  Protective sensation intact. Motor function intact.  LEFT foot: Incision is well coapted without any evidence of dehiscence and sutures are intact. There is no surrounding erythema, ascending cellulitis, fluctuance, crepitus, malodor, drainage/purulence. There is mild edema around the surgical site.  Minimal and improved ecchymosis is present there is mild pain along the surgical site.  No pain with MPJ range of motion No other areas of tenderness to bilateral lower extremities.  No other open lesions or pre-ulcerative lesions.  No pain with calf compression, swelling, warmth, erythema.   Assessment and Plan:  Status post left foot surgery, doing well with no complications   -Treatment options discussed including all alternatives, risks, and complications -Incision is healing well.  Antibiotic ointment and dressing applied.  Keep dressing clean, dry, intact. -Remain in surgical boot.  Weightbearing as tolerated. -Ice/elevation -Pain medication as needed -Monitor for any clinical signs or symptoms of infection and DVT/PE and directed to call the office immediately should any occur or go to the ER. -Follow-up as scheduled or sooner if any problems arise. In the meantime, encouraged to call the office with any questions, concerns, change in symptoms.   Celesta Gentile, DPM

## 2019-04-04 DIAGNOSIS — M2012 Hallux valgus (acquired), left foot: Secondary | ICD-10-CM | POA: Insufficient documentation

## 2019-04-04 NOTE — Progress Notes (Signed)
Subjective: Jeremy Diaz is a 53 y.o. is seen today in office s/p left foot Austin and 2nd metatarsal osteotomy preformed on 03/11/2019.  He states he has been experiencing some discomfort along the incision along with sutures.  He has been in the cam boot.  Is taking pain medicine only at nighttime and ibuprofen during the day.  He denies any fevers, chills, nausea, vomiting.  Denies any calf pain, chest pain, shortness of breath.  Objective: General: No acute distress, AAOx3  DP/PT pulses palpable 2/4, CRT < 3 sec to all digits.  Protective sensation intact. Motor function intact.  LEFT foot: Incision is well coapted without any evidence of dehiscence and sutures are intact.  There is no surrounding erythema.  There is no drainage or pus.  Mild swelling and ecchymosis but overall improving.  Mild discomfort to the surgical sites.  Minimal discomfort MPJ range of motion.  No other open lesions or pre-ulcerative lesions.  No pain with calf compression, swelling, warmth, erythema.   Assessment and Plan:  Status post left foot surgery  -Treatment options discussed including all alternatives, risks, and complications -Incision is healing without any signs of infection or dehiscence.  The sutures were removed today for the second metatarsal site and the incision remained well coapted.  Antibiotic ointment and dressing applied.  -Continue ice elevation wear cam boot at all times. -Pain medication as needed and ibuprofen every day. -Ice/elevation -Pain medication as needed -Monitor for any clinical signs or symptoms of infection and DVT/PE and directed to call the office immediately should any occur or go to the ER. -Follow-up as scheduled or sooner if any problems arise. In the meantime, encouraged to call the office with any questions, concerns, change in symptoms.   Celesta Gentile, DPM

## 2019-04-14 ENCOUNTER — Other Ambulatory Visit: Payer: Self-pay

## 2019-04-14 ENCOUNTER — Other Ambulatory Visit: Payer: Self-pay | Admitting: Podiatry

## 2019-04-14 ENCOUNTER — Ambulatory Visit (INDEPENDENT_AMBULATORY_CARE_PROVIDER_SITE_OTHER): Payer: BC Managed Care – PPO | Admitting: Podiatry

## 2019-04-14 ENCOUNTER — Ambulatory Visit (INDEPENDENT_AMBULATORY_CARE_PROVIDER_SITE_OTHER): Payer: BC Managed Care – PPO

## 2019-04-14 DIAGNOSIS — M79672 Pain in left foot: Secondary | ICD-10-CM | POA: Diagnosis not present

## 2019-04-14 DIAGNOSIS — M21619 Bunion of unspecified foot: Secondary | ICD-10-CM

## 2019-04-14 DIAGNOSIS — M2012 Hallux valgus (acquired), left foot: Secondary | ICD-10-CM | POA: Diagnosis not present

## 2019-04-21 NOTE — Progress Notes (Signed)
Subjective: Jeremy Diaz is a 53 y.o. is seen today in office s/p left foot Austin and 2nd metatarsal osteotomy preformed on 03/11/2019.  He states that he is doing better and then removed.  He states his pain is much improved.  Not taking any significant pain medication.  He denies any fevers, chills, nausea, vomiting.  No calf pain, chest pain, shortness of breath.  Objective: General: No acute distress, AAOx3  DP/PT pulses palpable 2/4, CRT < 3 sec to all digits.  Protective sensation intact. Motor function intact.  LEFT foot: Incision is well coapted without any evidence of dehiscence and sutures are intact.  There is no surrounding erythema.  There is no drainage or pus.  Mild but improved swelling and there is no ecchymosis to the foot.  No discomfort to the surgical sites.  Minimal discomfort MPJ range of motion however improving there is no pain with range of motion.  No other open lesions or pre-ulcerative lesions.  No pain with calf compression, swelling, warmth, erythema.   Assessment and Plan:  Status post left foot surgery  -Treatment options discussed including all alternatives, risks, and complications -X-rays obtained and reviewed.  No evidence of acute fracture and hardware intact with increased consolidation across the osteotomy site. -I removed the sutures today that still remained.  Antibiotic ointment and dressing applied.  He can get the area wet and dry thoroughly and keep a bandage on the area. -Dispensed surgical shoe.  Encourage range of motion exercises. -Continue ice/elevation  -Anti-inflammatory as needed -Monitor for any clinical signs or symptoms of infection and DVT/PE and directed to call the office immediately should any occur or go to the ER. -Follow-up as scheduled or sooner if any problems arise. In the meantime, encouraged to call the office with any questions, concerns, change in symptoms.   Celesta Gentile, DPM

## 2019-04-28 ENCOUNTER — Ambulatory Visit (INDEPENDENT_AMBULATORY_CARE_PROVIDER_SITE_OTHER): Payer: BC Managed Care – PPO

## 2019-04-28 ENCOUNTER — Ambulatory Visit (INDEPENDENT_AMBULATORY_CARE_PROVIDER_SITE_OTHER): Payer: Self-pay | Admitting: Podiatry

## 2019-04-28 ENCOUNTER — Other Ambulatory Visit: Payer: Self-pay

## 2019-04-28 DIAGNOSIS — Z9889 Other specified postprocedural states: Secondary | ICD-10-CM

## 2019-04-28 DIAGNOSIS — M21962 Unspecified acquired deformity of left lower leg: Secondary | ICD-10-CM | POA: Diagnosis not present

## 2019-04-28 DIAGNOSIS — M2012 Hallux valgus (acquired), left foot: Secondary | ICD-10-CM

## 2019-05-04 NOTE — Progress Notes (Signed)
Subjective: Jeremy Diaz is a 53 y.o. is seen today in office s/p left foot [place] and 2nd metatarsal osteotomy preformed on 03/11/2019.  He states that overall he feels that he is doing better.  His only offloading boot.  Denies any recent injury or falls.  He is not taking pain medication.  He denies any fevers, chills, nausea, vomiting.  No calf pain, chest pain, shortness of breath.  Objective: General: No acute distress, AAOx3  DP/PT pulses palpable 2/4, CRT < 3 sec to all digits.  Protective sensation intact. Motor function intact.  LEFT foot: Incision is well coapted without any evidence of dehiscence and theincision appears to be healing well without any signs of infection or dehiscence.  There is no surrounding erythema.  There is no drainage or pus.  Improved swelling.  There is no erythema or warmth.  Mild restriction first MPJ range of motion.  No significant pain or crepitation with range of motion. No other open lesions or pre-ulcerative lesions.  No pain with calf compression, swelling, warmth, erythema.   Assessment and Plan:  Status post left foot surgery  -Treatment options discussed including all alternatives, risks, and complications -X-rays obtained and reviewed.  No evidence of acute fracture and hardware intact with increased consolidation across the osteotomy site. -We will start physical therapy.  Prescription was written today. -He started transition to regular shoe as he is able. -Continue to ice elevate -Anti-inflammatory as needed -Monitor for any clinical signs or symptoms of infection and DVT/PE and directed to call the office immediately should any occur or go to the ER. -Follow-up as scheduled or sooner if any problems arise. In the meantime, encouraged to call the office with any questions, concerns, change in symptoms.   Celesta Gentile, DPM

## 2019-05-12 DIAGNOSIS — R262 Difficulty in walking, not elsewhere classified: Secondary | ICD-10-CM | POA: Diagnosis not present

## 2019-05-12 DIAGNOSIS — M6281 Muscle weakness (generalized): Secondary | ICD-10-CM | POA: Diagnosis not present

## 2019-05-12 DIAGNOSIS — M25672 Stiffness of left ankle, not elsewhere classified: Secondary | ICD-10-CM | POA: Diagnosis not present

## 2019-05-12 DIAGNOSIS — M25572 Pain in left ankle and joints of left foot: Secondary | ICD-10-CM | POA: Diagnosis not present

## 2019-05-13 DIAGNOSIS — M6281 Muscle weakness (generalized): Secondary | ICD-10-CM | POA: Diagnosis not present

## 2019-05-13 DIAGNOSIS — M25572 Pain in left ankle and joints of left foot: Secondary | ICD-10-CM | POA: Diagnosis not present

## 2019-05-13 DIAGNOSIS — R262 Difficulty in walking, not elsewhere classified: Secondary | ICD-10-CM | POA: Diagnosis not present

## 2019-05-13 DIAGNOSIS — M25672 Stiffness of left ankle, not elsewhere classified: Secondary | ICD-10-CM | POA: Diagnosis not present

## 2019-05-18 ENCOUNTER — Ambulatory Visit (INDEPENDENT_AMBULATORY_CARE_PROVIDER_SITE_OTHER): Payer: BC Managed Care – PPO | Admitting: Podiatry

## 2019-05-18 ENCOUNTER — Ambulatory Visit (INDEPENDENT_AMBULATORY_CARE_PROVIDER_SITE_OTHER): Payer: BC Managed Care – PPO

## 2019-05-18 ENCOUNTER — Other Ambulatory Visit: Payer: Self-pay

## 2019-05-18 ENCOUNTER — Ambulatory Visit (INDEPENDENT_AMBULATORY_CARE_PROVIDER_SITE_OTHER): Payer: BC Managed Care – PPO | Admitting: Orthotics

## 2019-05-18 ENCOUNTER — Encounter: Payer: Self-pay | Admitting: Podiatry

## 2019-05-18 DIAGNOSIS — M2012 Hallux valgus (acquired), left foot: Secondary | ICD-10-CM

## 2019-05-18 DIAGNOSIS — M21962 Unspecified acquired deformity of left lower leg: Secondary | ICD-10-CM

## 2019-05-18 DIAGNOSIS — M7752 Other enthesopathy of left foot: Secondary | ICD-10-CM

## 2019-05-18 DIAGNOSIS — M779 Enthesopathy, unspecified: Secondary | ICD-10-CM

## 2019-05-18 DIAGNOSIS — Z9889 Other specified postprocedural states: Secondary | ICD-10-CM

## 2019-05-18 DIAGNOSIS — M7751 Other enthesopathy of right foot: Secondary | ICD-10-CM

## 2019-05-18 NOTE — Addendum Note (Signed)
Addended by: Velora Heckler on: 05/18/2019 03:05 PM   Modules accepted: Level of Service

## 2019-05-18 NOTE — Progress Notes (Signed)
Patient came into today to be cast for [employer] [facility]otics. Upon recommendation of Dr. Jacqualyn Posey Patient presents with HAV left, met pain Left 2 Goals are forefoot cushioning and offloading 2 Left  Plan vendor [place]

## 2019-05-19 DIAGNOSIS — M25672 Stiffness of left ankle, not elsewhere classified: Secondary | ICD-10-CM | POA: Diagnosis not present

## 2019-05-19 DIAGNOSIS — M6281 Muscle weakness (generalized): Secondary | ICD-10-CM | POA: Diagnosis not present

## 2019-05-19 DIAGNOSIS — M25572 Pain in left ankle and joints of left foot: Secondary | ICD-10-CM | POA: Diagnosis not present

## 2019-05-19 DIAGNOSIS — R262 Difficulty in walking, not elsewhere classified: Secondary | ICD-10-CM | POA: Diagnosis not present

## 2019-05-20 ENCOUNTER — Telehealth: Payer: Self-pay | Admitting: Podiatry

## 2019-05-20 DIAGNOSIS — M25672 Stiffness of left ankle, not elsewhere classified: Secondary | ICD-10-CM | POA: Diagnosis not present

## 2019-05-20 DIAGNOSIS — R262 Difficulty in walking, not elsewhere classified: Secondary | ICD-10-CM | POA: Diagnosis not present

## 2019-05-20 DIAGNOSIS — M6281 Muscle weakness (generalized): Secondary | ICD-10-CM | POA: Diagnosis not present

## 2019-05-20 DIAGNOSIS — M25572 Pain in left ankle and joints of left foot: Secondary | ICD-10-CM | POA: Diagnosis not present

## 2019-05-20 NOTE — Telephone Encounter (Signed)
I have a return to work date for 05/22/2019. Jeremy Diaz says that you wanted him to stay out of work longer. Please advice, I need to send paperwork over to [facility] for Disability

## 2019-05-20 NOTE — Telephone Encounter (Signed)
Lets plan on a return Jan 11

## 2019-05-21 NOTE — Progress Notes (Addendum)
Subjective: Jeremy Diaz is a 53 y.o. is seen today in office s/p left foot [place] and 2nd metatarsal osteotomy preformed on 03/11/2019.  He says overall he is feeling better.  He is transition back to regular shoe and he still doing physical therapy which is been helpful.  Denies any significant pain.  He has not yet tried to wear his steel toed shoes that he has to wear at work.  Denies any recent injury or changes otherwise.  No new concerns. He denies any fevers, chills, nausea, vomiting.  No calf pain, chest pain, shortness of breath.  Objective: General: No acute distress, AAOx3  DP/PT pulses palpable 2/4, CRT < 3 sec to all digits.  Protective sensation intact. Motor function intact.  LEFT foot: Incision is well coapted without any evidence of dehiscence and the incision appears to be healing well and scar is formed.  There is no clinical signs of infection.  There is no significant tenderness palpation the surgical site.  No pain with MPJ range of motion No other open lesions or pre-ulcerative lesions.  No pain with calf compression, swelling, warmth, erythema.   Assessment and Plan:  Status post left foot surgery  -Treatment options discussed including all alternatives, risks, and complications -X-rays obtained and reviewed.  Increased consolidation across the osteotomy sites with hardware intact.  No evidence of acute fracture identified today.  No loosening of the hardware. -For now continue with physical therapy and range of motion exercises.  Continue with compression anklet ice elevation.  Transition back to regular shoe and I want him to start to try his steel toe shoe.  We will extend his out of work until February 1st At this time he is not able to wear a regular shoe/boot full time and I want to make sure he can do this and also get the inserts.   Return in about 4 weeks (around 06/15/2019).  Trula Slade DPM

## 2019-05-26 ENCOUNTER — Telehealth: Payer: Self-pay | Admitting: Podiatry

## 2019-05-26 DIAGNOSIS — M25572 Pain in left ankle and joints of left foot: Secondary | ICD-10-CM | POA: Diagnosis not present

## 2019-05-26 DIAGNOSIS — R635 Abnormal weight gain: Secondary | ICD-10-CM | POA: Diagnosis not present

## 2019-05-26 DIAGNOSIS — R7303 Prediabetes: Secondary | ICD-10-CM | POA: Diagnosis not present

## 2019-05-26 DIAGNOSIS — M25672 Stiffness of left ankle, not elsewhere classified: Secondary | ICD-10-CM | POA: Diagnosis not present

## 2019-05-26 DIAGNOSIS — E7800 Pure hypercholesterolemia, unspecified: Secondary | ICD-10-CM | POA: Diagnosis not present

## 2019-05-26 DIAGNOSIS — M6281 Muscle weakness (generalized): Secondary | ICD-10-CM | POA: Diagnosis not present

## 2019-05-26 DIAGNOSIS — I10 Essential (primary) hypertension: Secondary | ICD-10-CM | POA: Diagnosis not present

## 2019-05-26 DIAGNOSIS — R262 Difficulty in walking, not elsewhere classified: Secondary | ICD-10-CM | POA: Diagnosis not present

## 2019-05-26 NOTE — Telephone Encounter (Signed)
Patient called and wants to know, is he suppose to be going back to work on Jan 11,2021, before he gets his orthotics. His orthotics wont be ready until around 06/18/2019, and he comes in to see you on 06/15/2019.  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

## 2019-05-26 NOTE — Telephone Encounter (Signed)
We can extend his out of work until he gets the inserts- maybe do a Feb 1 date if he is OK with that.

## 2019-05-28 DIAGNOSIS — M25572 Pain in left ankle and joints of left foot: Secondary | ICD-10-CM | POA: Diagnosis not present

## 2019-05-28 DIAGNOSIS — M6281 Muscle weakness (generalized): Secondary | ICD-10-CM | POA: Diagnosis not present

## 2019-05-28 DIAGNOSIS — M545 Low back pain: Secondary | ICD-10-CM | POA: Diagnosis not present

## 2019-05-28 DIAGNOSIS — M5416 Radiculopathy, lumbar region: Secondary | ICD-10-CM | POA: Diagnosis not present

## 2019-05-28 DIAGNOSIS — R262 Difficulty in walking, not elsewhere classified: Secondary | ICD-10-CM | POA: Diagnosis not present

## 2019-05-28 DIAGNOSIS — M25672 Stiffness of left ankle, not elsewhere classified: Secondary | ICD-10-CM | POA: Diagnosis not present

## 2019-06-02 DIAGNOSIS — R262 Difficulty in walking, not elsewhere classified: Secondary | ICD-10-CM | POA: Diagnosis not present

## 2019-06-02 DIAGNOSIS — M25672 Stiffness of left ankle, not elsewhere classified: Secondary | ICD-10-CM | POA: Diagnosis not present

## 2019-06-02 DIAGNOSIS — M6281 Muscle weakness (generalized): Secondary | ICD-10-CM | POA: Diagnosis not present

## 2019-06-02 DIAGNOSIS — M25572 Pain in left ankle and joints of left foot: Secondary | ICD-10-CM | POA: Diagnosis not present

## 2019-06-04 DIAGNOSIS — M25572 Pain in left ankle and joints of left foot: Secondary | ICD-10-CM | POA: Diagnosis not present

## 2019-06-04 DIAGNOSIS — R262 Difficulty in walking, not elsewhere classified: Secondary | ICD-10-CM | POA: Diagnosis not present

## 2019-06-04 DIAGNOSIS — M25672 Stiffness of left ankle, not elsewhere classified: Secondary | ICD-10-CM | POA: Diagnosis not present

## 2019-06-04 DIAGNOSIS — M6281 Muscle weakness (generalized): Secondary | ICD-10-CM | POA: Diagnosis not present

## 2019-06-04 DIAGNOSIS — M5416 Radiculopathy, lumbar region: Secondary | ICD-10-CM | POA: Diagnosis not present

## 2019-06-05 ENCOUNTER — Encounter: Payer: Self-pay | Admitting: Podiatry

## 2019-06-15 ENCOUNTER — Other Ambulatory Visit: Payer: BC Managed Care – PPO | Admitting: Orthotics

## 2019-06-15 ENCOUNTER — Other Ambulatory Visit: Payer: Self-pay

## 2019-06-15 ENCOUNTER — Ambulatory Visit (INDEPENDENT_AMBULATORY_CARE_PROVIDER_SITE_OTHER): Payer: BC Managed Care – PPO | Admitting: Podiatry

## 2019-06-15 DIAGNOSIS — M21619 Bunion of unspecified foot: Secondary | ICD-10-CM

## 2019-06-15 DIAGNOSIS — M2012 Hallux valgus (acquired), left foot: Secondary | ICD-10-CM

## 2019-06-15 DIAGNOSIS — M779 Enthesopathy, unspecified: Secondary | ICD-10-CM

## 2019-06-16 NOTE — Progress Notes (Signed)
Subjective: Jeremy Diaz is a 54 y.o. is seen today in office s/p left foot Austin and 2nd metatarsal osteotomy preformed on 03/11/2019.  From a surgical standpoint he is doing well and is back to wearing a regular shoe.  He had some occasional discomfort at the end of the day but not where he needs to take any medication for it and is intermittent.  He presents to pick up orthotics.  He scheduled to back to work next Monday.  He has no other concerns today.  No recent injuries.    Objective: General: No acute distress, AAOx3  DP/PT pulses palpable 2/4, CRT < 3 sec to all digits.  Protective sensation intact. Motor function intact.  LEFT foot: Incision is well healed with a scar.  There is improved range of motion of the first MPJ and there is no pain with MPJ range of motion.  There is no tenderness to palpation of the surgical site.  No pain the second MPJ. No other open lesions or pre-ulcerative lesions.  No pain with calf compression, swelling, warmth, erythema.   Assessment and Plan:  Status post left foot surgery doing well presents to pick up orthotics  -Treatment options discussed including all alternatives, risks, and complications -From a surgical standpoint he is doing well and is back to regular shoe.  Orthotics were dispensed today.  Oral and written break-in instructions were discussed. -Continue range of motion exercises. -Schedule go back to work on Monday.  If he has any issues with this to let me know.  Also at this point since he is doing well we will discharge him to the postoperative care.  I will see her back in 4 weeks if needed for orthotic check.  Return in about 4 weeks (around 07/13/2019) for orthotic check.  Vivi Barrack DPM

## 2019-06-18 ENCOUNTER — Encounter: Payer: Self-pay | Admitting: Podiatry

## 2019-07-01 DIAGNOSIS — M79662 Pain in left lower leg: Secondary | ICD-10-CM | POA: Diagnosis not present

## 2019-07-13 ENCOUNTER — Ambulatory Visit: Payer: BC Managed Care – PPO | Admitting: Podiatry

## 2019-07-22 DIAGNOSIS — M79662 Pain in left lower leg: Secondary | ICD-10-CM | POA: Diagnosis not present

## 2019-07-28 DIAGNOSIS — M79662 Pain in left lower leg: Secondary | ICD-10-CM | POA: Diagnosis not present

## 2019-07-31 DIAGNOSIS — M79662 Pain in left lower leg: Secondary | ICD-10-CM | POA: Diagnosis not present

## 2019-11-13 DIAGNOSIS — L290 Pruritus ani: Secondary | ICD-10-CM | POA: Diagnosis not present

## 2019-11-16 DIAGNOSIS — B80 Enterobiasis: Secondary | ICD-10-CM | POA: Diagnosis not present

## 2019-12-07 DIAGNOSIS — H6980 Other specified disorders of Eustachian tube, unspecified ear: Secondary | ICD-10-CM | POA: Diagnosis not present

## 2019-12-14 ENCOUNTER — Encounter: Payer: Self-pay | Admitting: Podiatry

## 2019-12-14 ENCOUNTER — Ambulatory Visit: Payer: BC Managed Care – PPO | Admitting: Podiatry

## 2019-12-14 ENCOUNTER — Ambulatory Visit (INDEPENDENT_AMBULATORY_CARE_PROVIDER_SITE_OTHER): Payer: BC Managed Care – PPO

## 2019-12-14 ENCOUNTER — Other Ambulatory Visit: Payer: Self-pay

## 2019-12-14 DIAGNOSIS — M2012 Hallux valgus (acquired), left foot: Secondary | ICD-10-CM | POA: Diagnosis not present

## 2019-12-14 DIAGNOSIS — M779 Enthesopathy, unspecified: Secondary | ICD-10-CM

## 2019-12-14 MED ORDER — DICLOFENAC SODIUM 1 % EX GEL: 2.0000 g | Freq: Four times a day (QID) | CUTANEOUS | 2 refills | Status: DC

## 2019-12-20 NOTE — Progress Notes (Signed)
Subjective: 54 year old male presents the office today for concerns of discomfort on the bunion site only with wearing boots.  Otherwise he has no pain has been doing well.  Underway off statin bunionectomy as well as the metatarsal osteotomy on March 11, 2019.  He was doing well at discharge from the care for him on her left foot check to see discussed with some mild discomfort only with wearing the boots.  Denies any recent injury. Denies any systemic complaints such as fevers, chills, nausea, vomiting. No acute changes since last appointment, and no other complaints at this time.   Objective: AAO x3, NAD DP/PT pulses palpable bilaterally, CRT less than 3 seconds At this time there is no area of discomfort identified on exam but subjectively does get discomfort in the medial first MPJ there is no pain or crepitation with range of motion and hardware is not palpable.  No pain with calf compression, swelling, warmth, erythema  Assessment: Capsulitis  Plan: -All treatment options discussed with the patient including all alternatives, risks, complications.  -X-rays obtained reviewed.  Hardware intact.  No evidence of complicating factors. -I dispensed an offloading pad for him.  He is Voltaren gel as needed.  Offered steroid injection but we will hold off on that for now. -Patient encouraged to call the office with any questions, concerns, change in symptoms.   Vivi Barrack DPM

## 2019-12-23 ENCOUNTER — Telehealth: Payer: Self-pay | Admitting: *Deleted

## 2019-12-23 NOTE — Telephone Encounter (Signed)
Called and spoke with the patient and stated that he could get the voltaren gel at the local pharmacy. Misty Stanley

## 2019-12-29 ENCOUNTER — Telehealth: Payer: Self-pay

## 2020-01-07 DIAGNOSIS — I10 Essential (primary) hypertension: Secondary | ICD-10-CM | POA: Diagnosis not present

## 2020-01-07 DIAGNOSIS — Z0000 Encounter for general adult medical examination without abnormal findings: Secondary | ICD-10-CM | POA: Diagnosis not present

## 2020-01-07 DIAGNOSIS — L570 Actinic keratosis: Secondary | ICD-10-CM | POA: Diagnosis not present

## 2020-01-07 DIAGNOSIS — Z125 Encounter for screening for malignant neoplasm of prostate: Secondary | ICD-10-CM | POA: Diagnosis not present

## 2020-01-07 DIAGNOSIS — E7800 Pure hypercholesterolemia, unspecified: Secondary | ICD-10-CM | POA: Diagnosis not present

## 2020-01-07 DIAGNOSIS — K219 Gastro-esophageal reflux disease without esophagitis: Secondary | ICD-10-CM | POA: Diagnosis not present

## 2020-01-07 DIAGNOSIS — R7303 Prediabetes: Secondary | ICD-10-CM | POA: Diagnosis not present

## 2020-01-14 DIAGNOSIS — H9122 Sudden idiopathic hearing loss, left ear: Secondary | ICD-10-CM | POA: Diagnosis not present

## 2020-01-14 DIAGNOSIS — H9192 Unspecified hearing loss, left ear: Secondary | ICD-10-CM | POA: Diagnosis not present

## 2020-04-11 ENCOUNTER — Other Ambulatory Visit: Payer: Self-pay

## 2020-04-11 ENCOUNTER — Ambulatory Visit (INDEPENDENT_AMBULATORY_CARE_PROVIDER_SITE_OTHER): Payer: BC Managed Care – PPO

## 2020-04-11 ENCOUNTER — Ambulatory Visit: Payer: BC Managed Care – PPO | Admitting: Podiatry

## 2020-04-11 DIAGNOSIS — M779 Enthesopathy, unspecified: Secondary | ICD-10-CM

## 2020-04-11 DIAGNOSIS — M2012 Hallux valgus (acquired), left foot: Secondary | ICD-10-CM | POA: Diagnosis not present

## 2020-04-11 DIAGNOSIS — M7752 Other enthesopathy of left foot: Secondary | ICD-10-CM

## 2020-04-11 DIAGNOSIS — M21622 Bunionette of left foot: Secondary | ICD-10-CM | POA: Diagnosis not present

## 2020-04-19 NOTE — Progress Notes (Signed)
Subjective: 54 year old male presents the office today for concerns of discomfort on the bunion site only with wearing boots.  He presents today for evaluation presents with increased discomfort along the area of the surgery.  He states he still only has discomfort with wearing boots he does not wearing his boots he has no discomfort.  He said no recent treatment or fall since I last saw him he has no other concerns today.  No significant increase in swelling.  Denies any recent injury. Denies any systemic complaints such as fevers, chills, nausea, vomiting. No acute changes since last appointment, and no other complaints at this time.   Objective: AAO x3, NAD DP/PT pulses palpable bilaterally, CRT less than 3 seconds On exam today there is no area of discomfort identified but subjectively discomfort on the medial first MPJ only with wearing boots.  There is trace edema there is no erythema or warmth.  There is no other areas of discomfort.  Is no pain or crepitation with first MPJ range of motion.  Tailor's bunion present.  Flexor, extensor tendons appear to be intact.  MMT 5/5. warmth, erythema  Assessment: Capsulitis  Plan: -All treatment options discussed with the patient including all alternatives, risks, complications.  -Repeat x-rays obtained reviewed.  Hardware intact without any changes.  No evidence of acute fracture. -Unfortunately having discomfort wearing boots.  Discussed different offloading pads again today but I did dispense.  We can also consider steroid injection but wants to hold off on this.  I do also think because he has a tailor's bunion is a wide forefoot this is putting pressure to the area.  He wants to hold off any further surgical intervention.  Vivi Barrack DPM

## 2020-05-27 DIAGNOSIS — J029 Acute pharyngitis, unspecified: Secondary | ICD-10-CM | POA: Diagnosis not present

## 2020-05-27 DIAGNOSIS — J00 Acute nasopharyngitis [common cold]: Secondary | ICD-10-CM | POA: Diagnosis not present

## 2020-10-16 IMAGING — CT CT ANGIO CHEST
2 of 7 series · 17 of 46 positions shown · IV contrast (iopamidol)
Comparison: Chest radiograph June 25, 2018

CLINICAL DATA: Shortness of breath and chest pain. Recent
paraesophageal hernia repair

EXAM:
CT ANGIOGRAPHY CHEST WITH CONTRAST
TECHNIQUE: Multidetector CT imaging of the chest was performed using the
standard protocol during bolus administration of intravenous
contrast. Multiplanar CT image reconstructions and MIPs were
obtained to evaluate the vascular anatomy.
CONTRAST:  100mL [id number] IOPAMIDOL ([id number]) INJECTION 76%

[Series 7: thins · axial · 0.71mm/px · z∈[+1172,+1381]mm · 14 of 337 slices shown]
[im 19/337  lung]
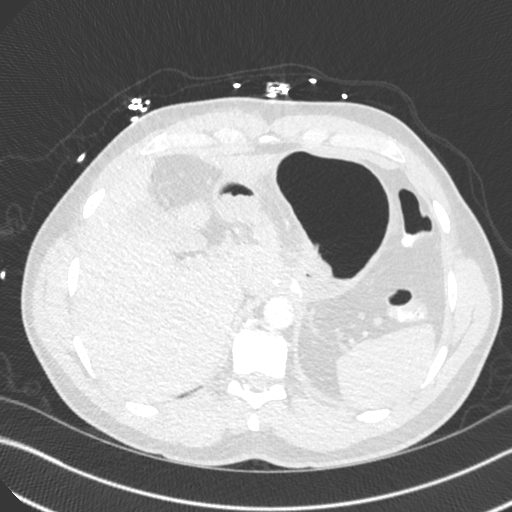
[im 38/337  soft-tissue]
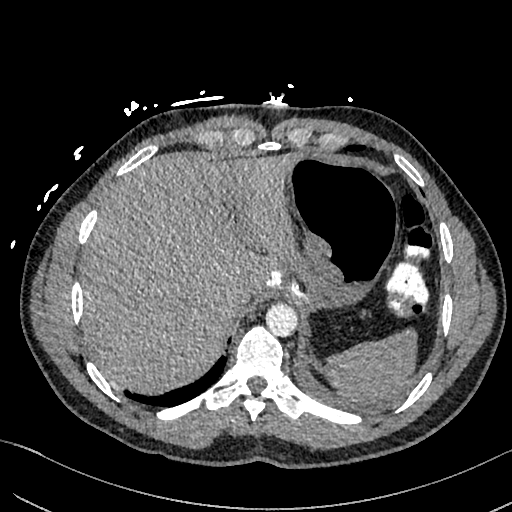
[im 75/337  lung]
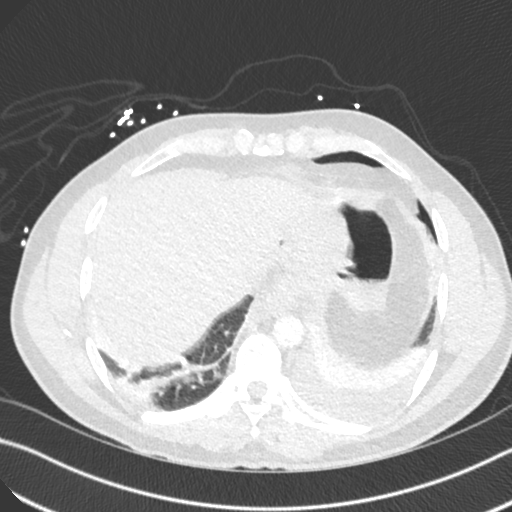
[im 94/337  soft-tissue]
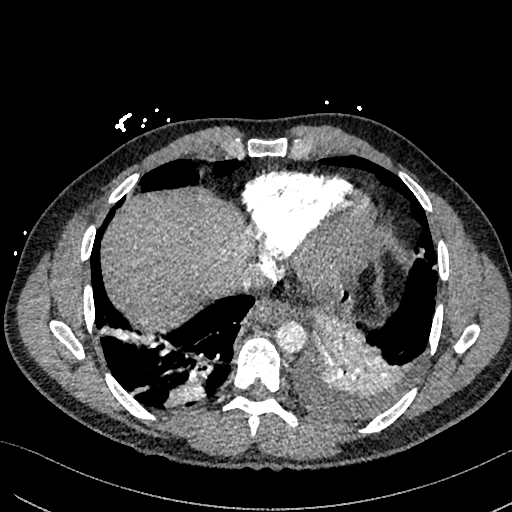
[im 113/337  lung]
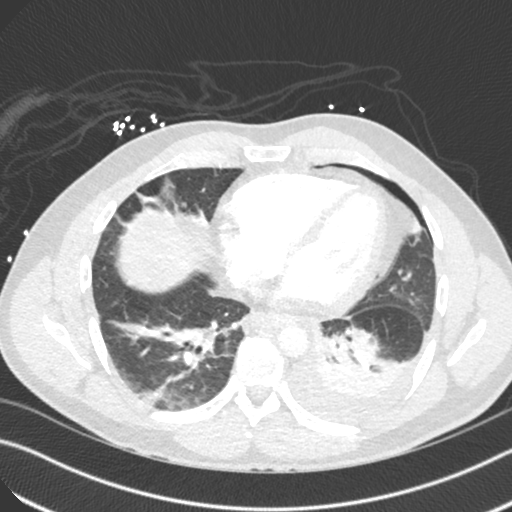
[im 131/337  soft-tissue]
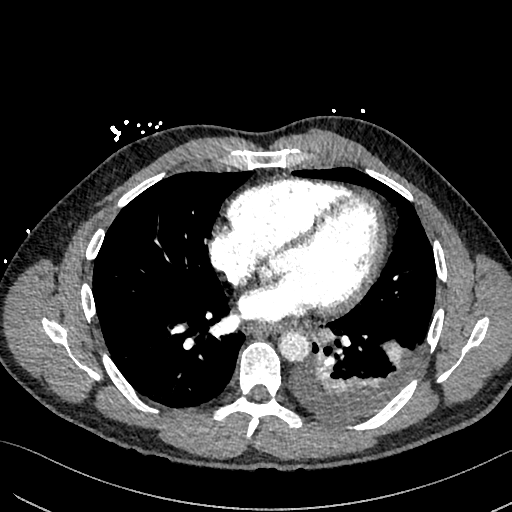
[im 150/337  lung]
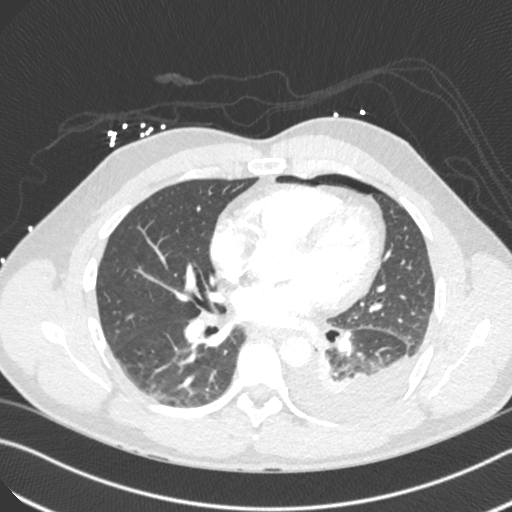
[im 187/337  soft-tissue]
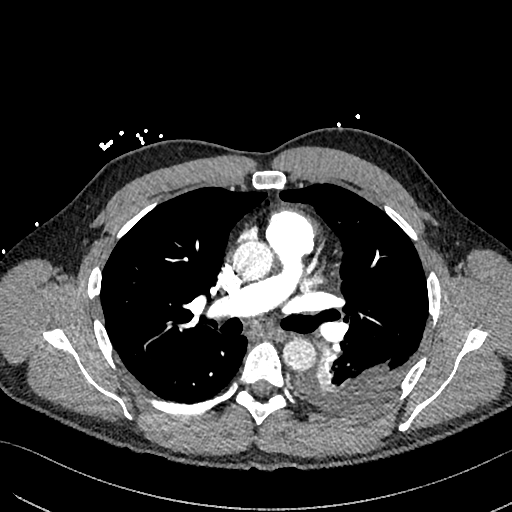
[im 206/337  lung]
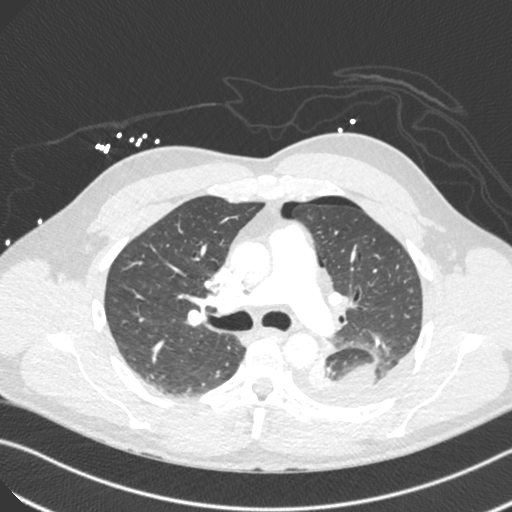
[im 225/337  soft-tissue]
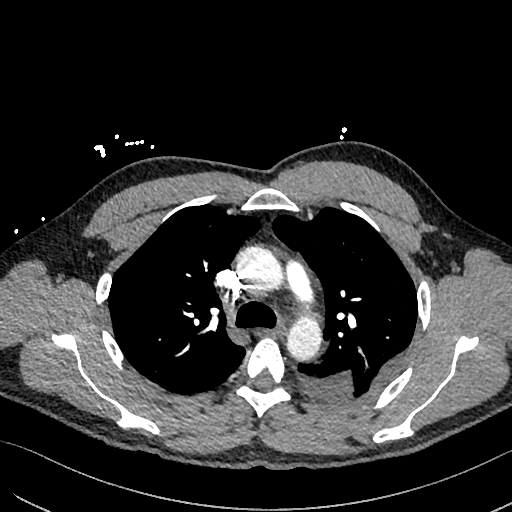
[im 243/337  lung]
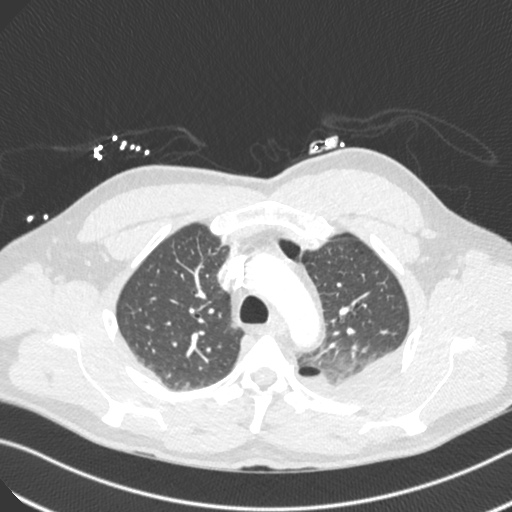
[im 262/337  soft-tissue]
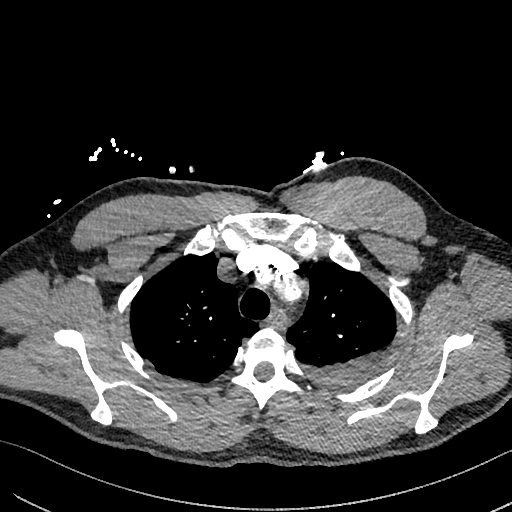
[im 299/337  lung]
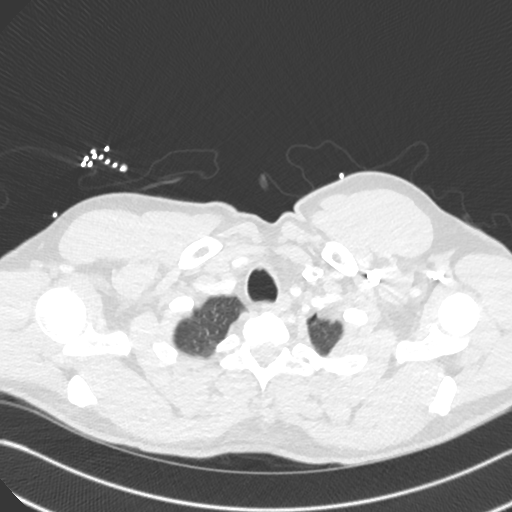
[im 318/337  soft-tissue]
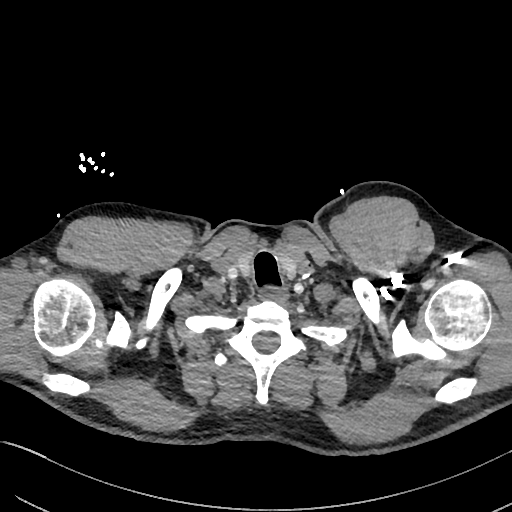

[Series 8: cor · coronal · 0.48mm/px · 3 of 142 slices shown]
[im 36/142  soft-tissue]
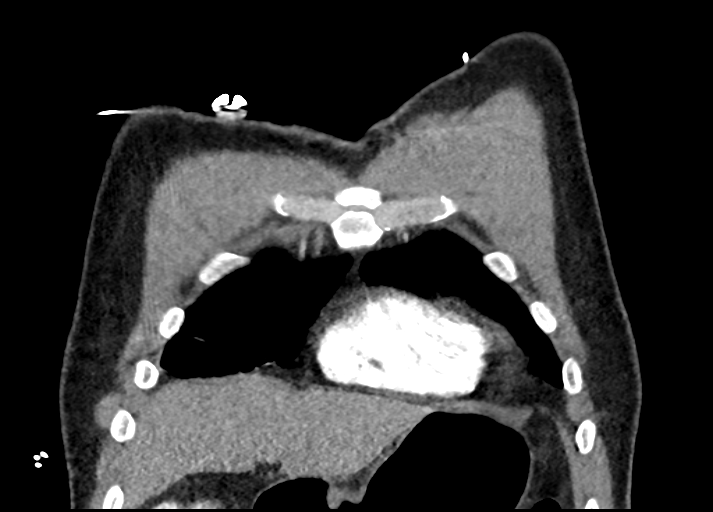
[im 71/142  soft-tissue]
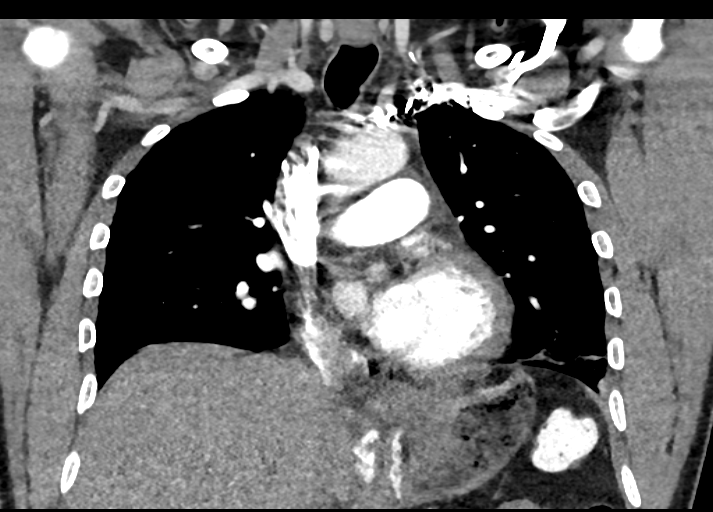
[im 106/142  soft-tissue]
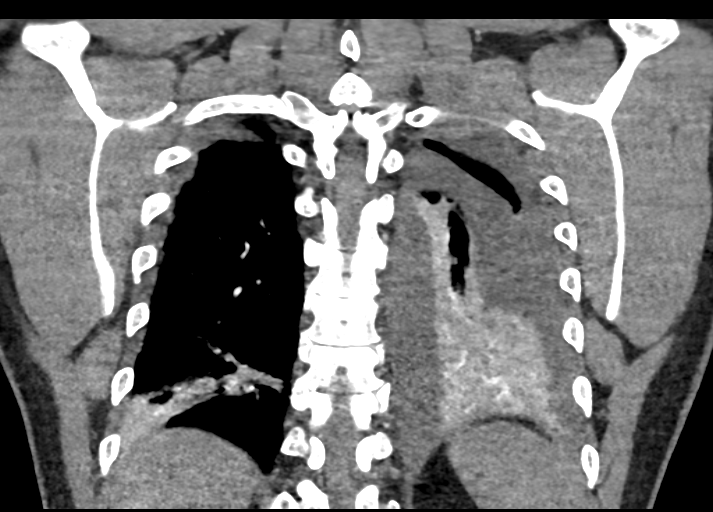

[17 of 46 positions shown; findings below may reference images not displayed]

FINDINGS: Cardiovascular: There is no demonstrable pulmonary embolus. There is
no thoracic aortic aneurysm or dissection. The visualized great
vessels appear unremarkable. There is no pericardial effusion or
pericardial thickening.

Mediastinum/Nodes: Visualized thyroid appears normal. There is no
appreciable thoracic adenopathy. No esophageal lesions are evident.

Lungs/Pleura: There is a moderate pleural effusion on the left which
appears partially loculated. There is airspace consolidation
consistent with pneumonia in portions of the left lower lobe,
primarily involving superior, medial, and posterior segments. There
is also patchy infiltrate in portions of the posterolateral segments
of the right lower lobe. There is atelectatic change in the inferior
aspect of the right middle lobe.

There is a small pneumothorax on the left without tension component.
This pneumothorax is seen in the apical region. There is also a
small amount of loculated air posteromedially on the left. There is
no appreciable pneumomediastinum.

Upper Abdomen: There are apparent pledgets the gastric cardia
consistent with recent surgery. Visualized upper abdominal
structures otherwise appear normal.

Musculoskeletal: No blastic or lytic bone lesions. No fracture or
dislocation evident. No chest wall lesions are appreciable.

Review of the MIP images confirms the above findings.
IMPRESSION: 1. No demonstrable pulmonary embolus. No thoracic aortic aneurysm or
dissection.

2. Small pneumothorax on the left without tension component, similar
to chest radiograph obtained earlier in the day.

3. Consolidation consistent with pneumonia throughout much of the
left lower lobe with associated partially loculated pleural effusion
on the left. Smaller area of apparent pneumonia involving portions
of the posterolateral segments of the right lower lobe.

4.  No appreciable thoracic adenopathy.

## 2020-10-16 IMAGING — DX DG CHEST 2V
2 series · 2 of 2 positions shown · non-contrast
Comparison: June 17, 2016.

CLINICAL DATA: Shortness of breath and cough

EXAM:
CHEST - 2 VIEW

[chest pa]
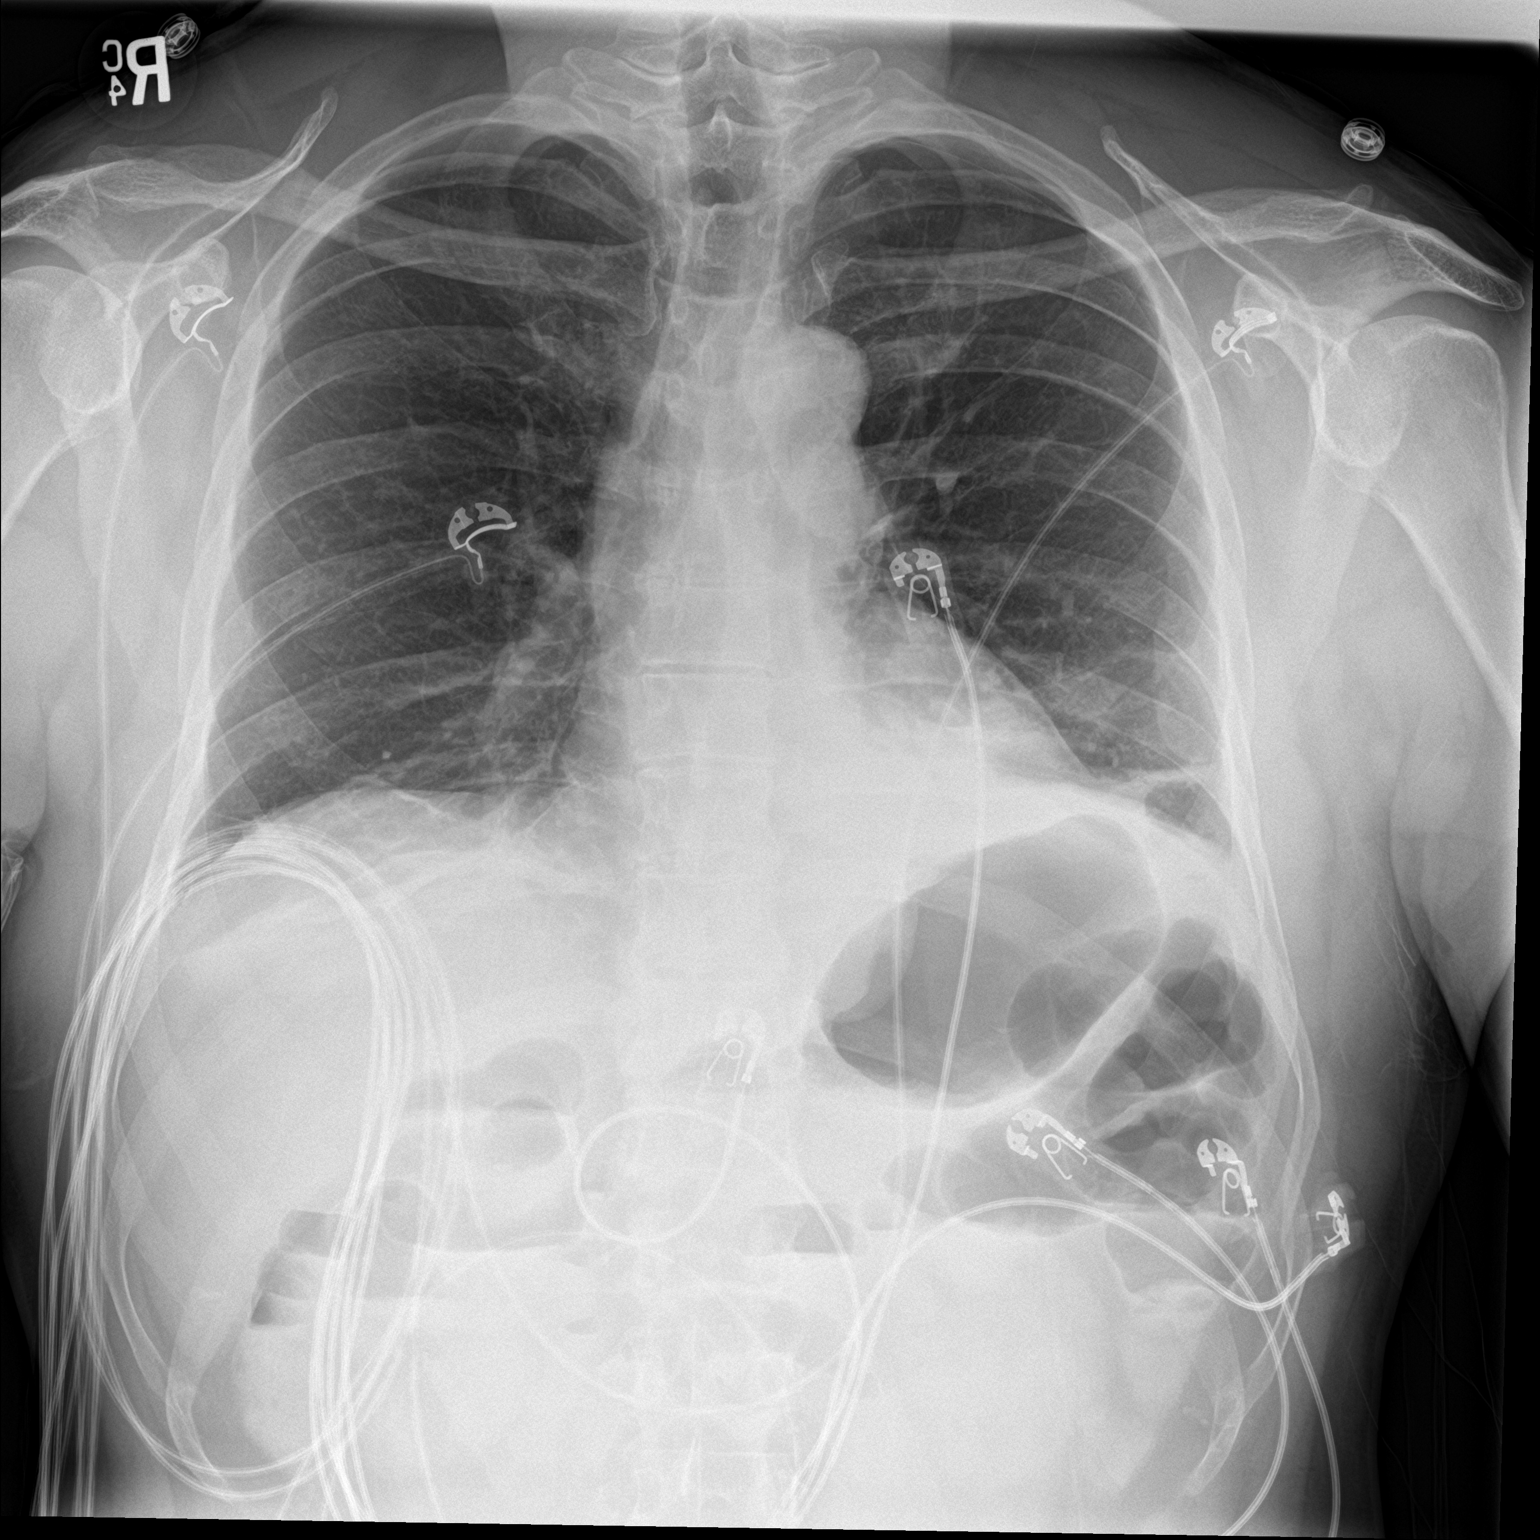

[chest lat]
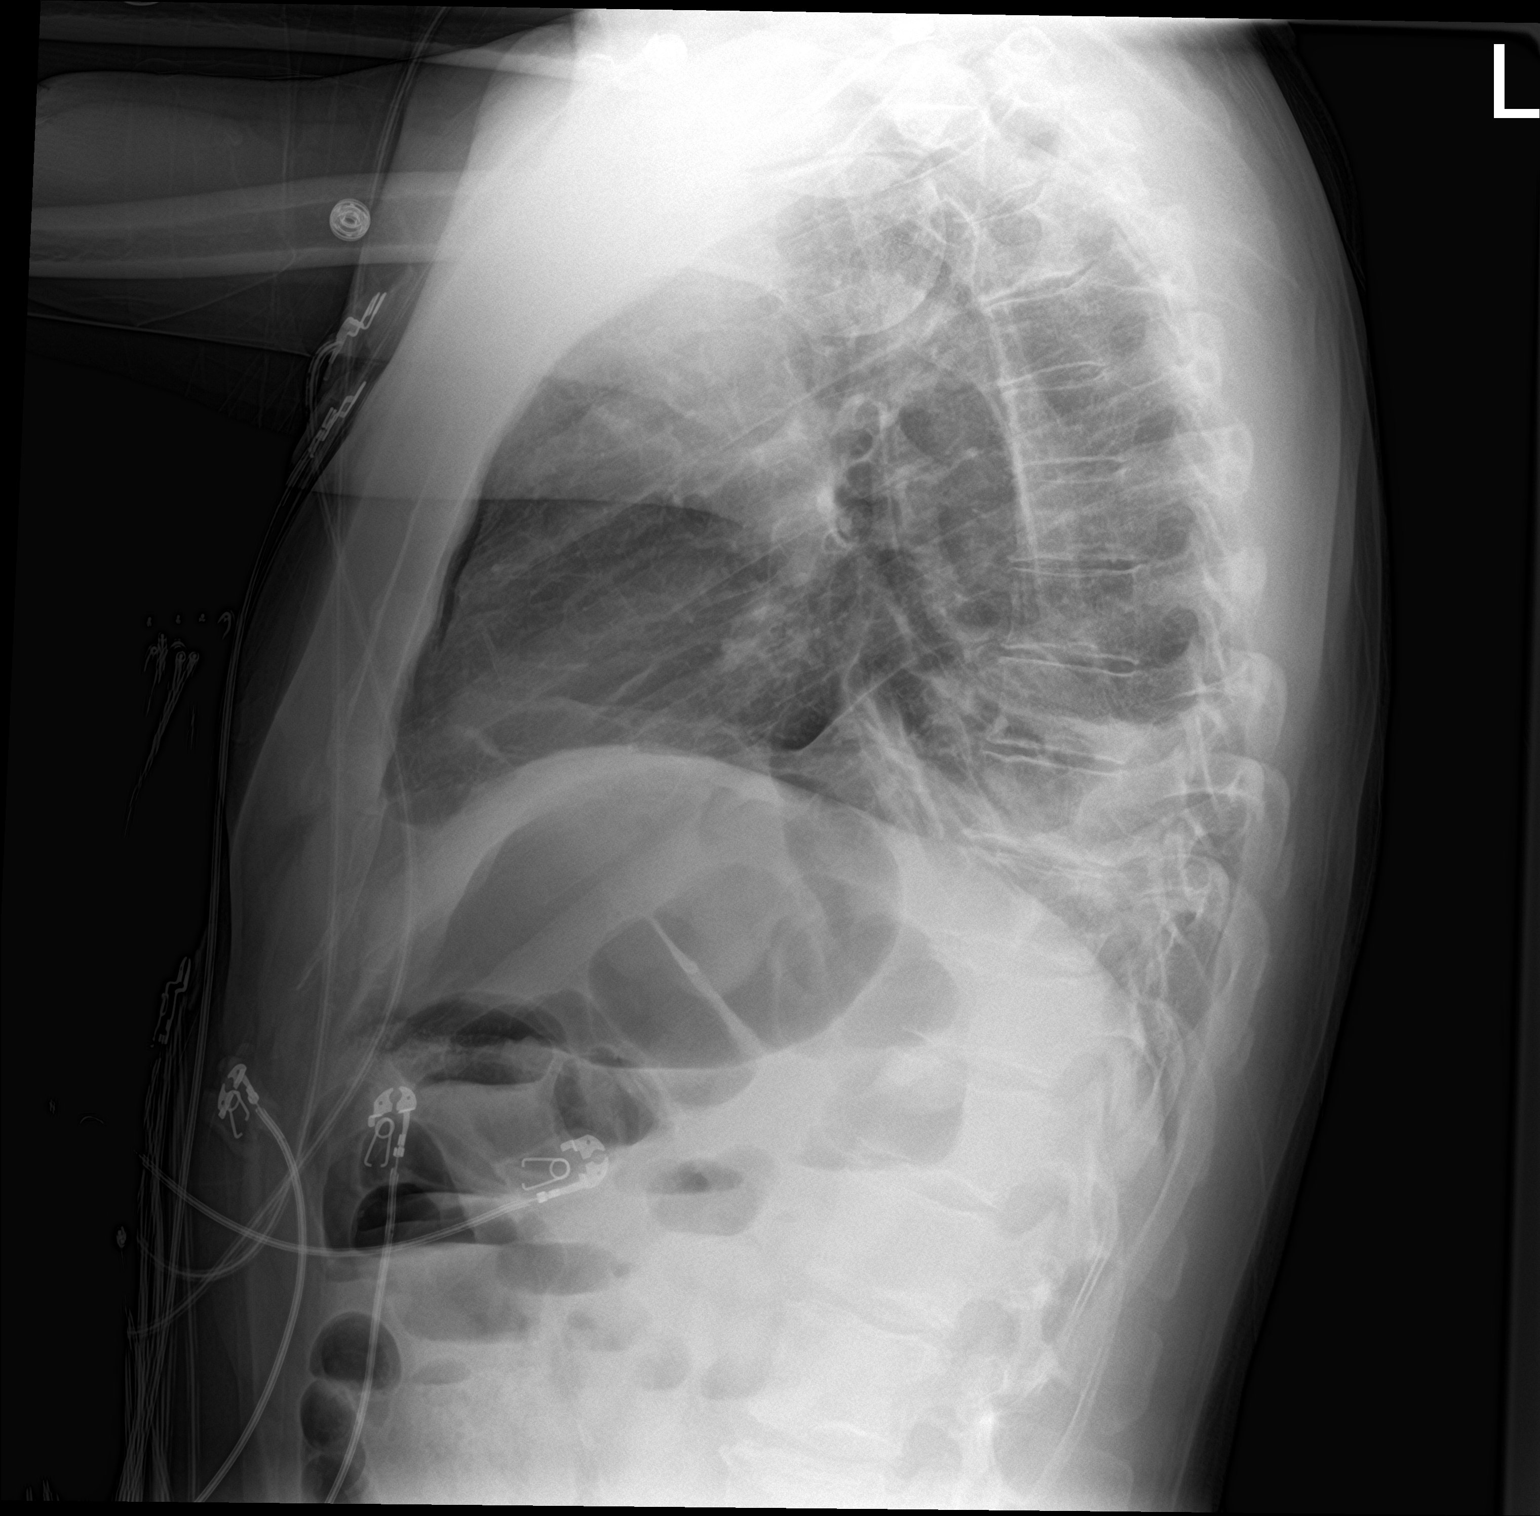

[2 of 2 positions shown; findings below may reference images not displayed]

FINDINGS: There is a small left apical pneumothorax without tension component.
There is airspace consolidation in the posterior left base region.
There is mild bibasilar atelectasis as well. The lungs elsewhere are
clear. Heart size and pulmonary vascularity are normal. No
adenopathy. No bone lesions appreciable.
IMPRESSION: 1.  Small left apical pneumothorax without tension component.

2. Airspace consolidation consistent with pneumonia posterior left
base.

3.  Bibasilar atelectasis.

4.  Heart size normal.

Critical Value/emergent results were called by telephone at the time
of interpretation on 06/25/2018 at [DATE] to Dr. Tol Melaku. ED
physician, Who verbally acknowledged these results.

## 2020-10-24 IMAGING — CR DG CHEST 2V
2 series · 2 of 2 positions shown · non-contrast
Comparison: 06/26/2018 and older exams.

CLINICAL DATA: Followup for left pneumothorax. No current
complaints.

EXAM:
CHEST - 2 VIEW

[w chest pa]
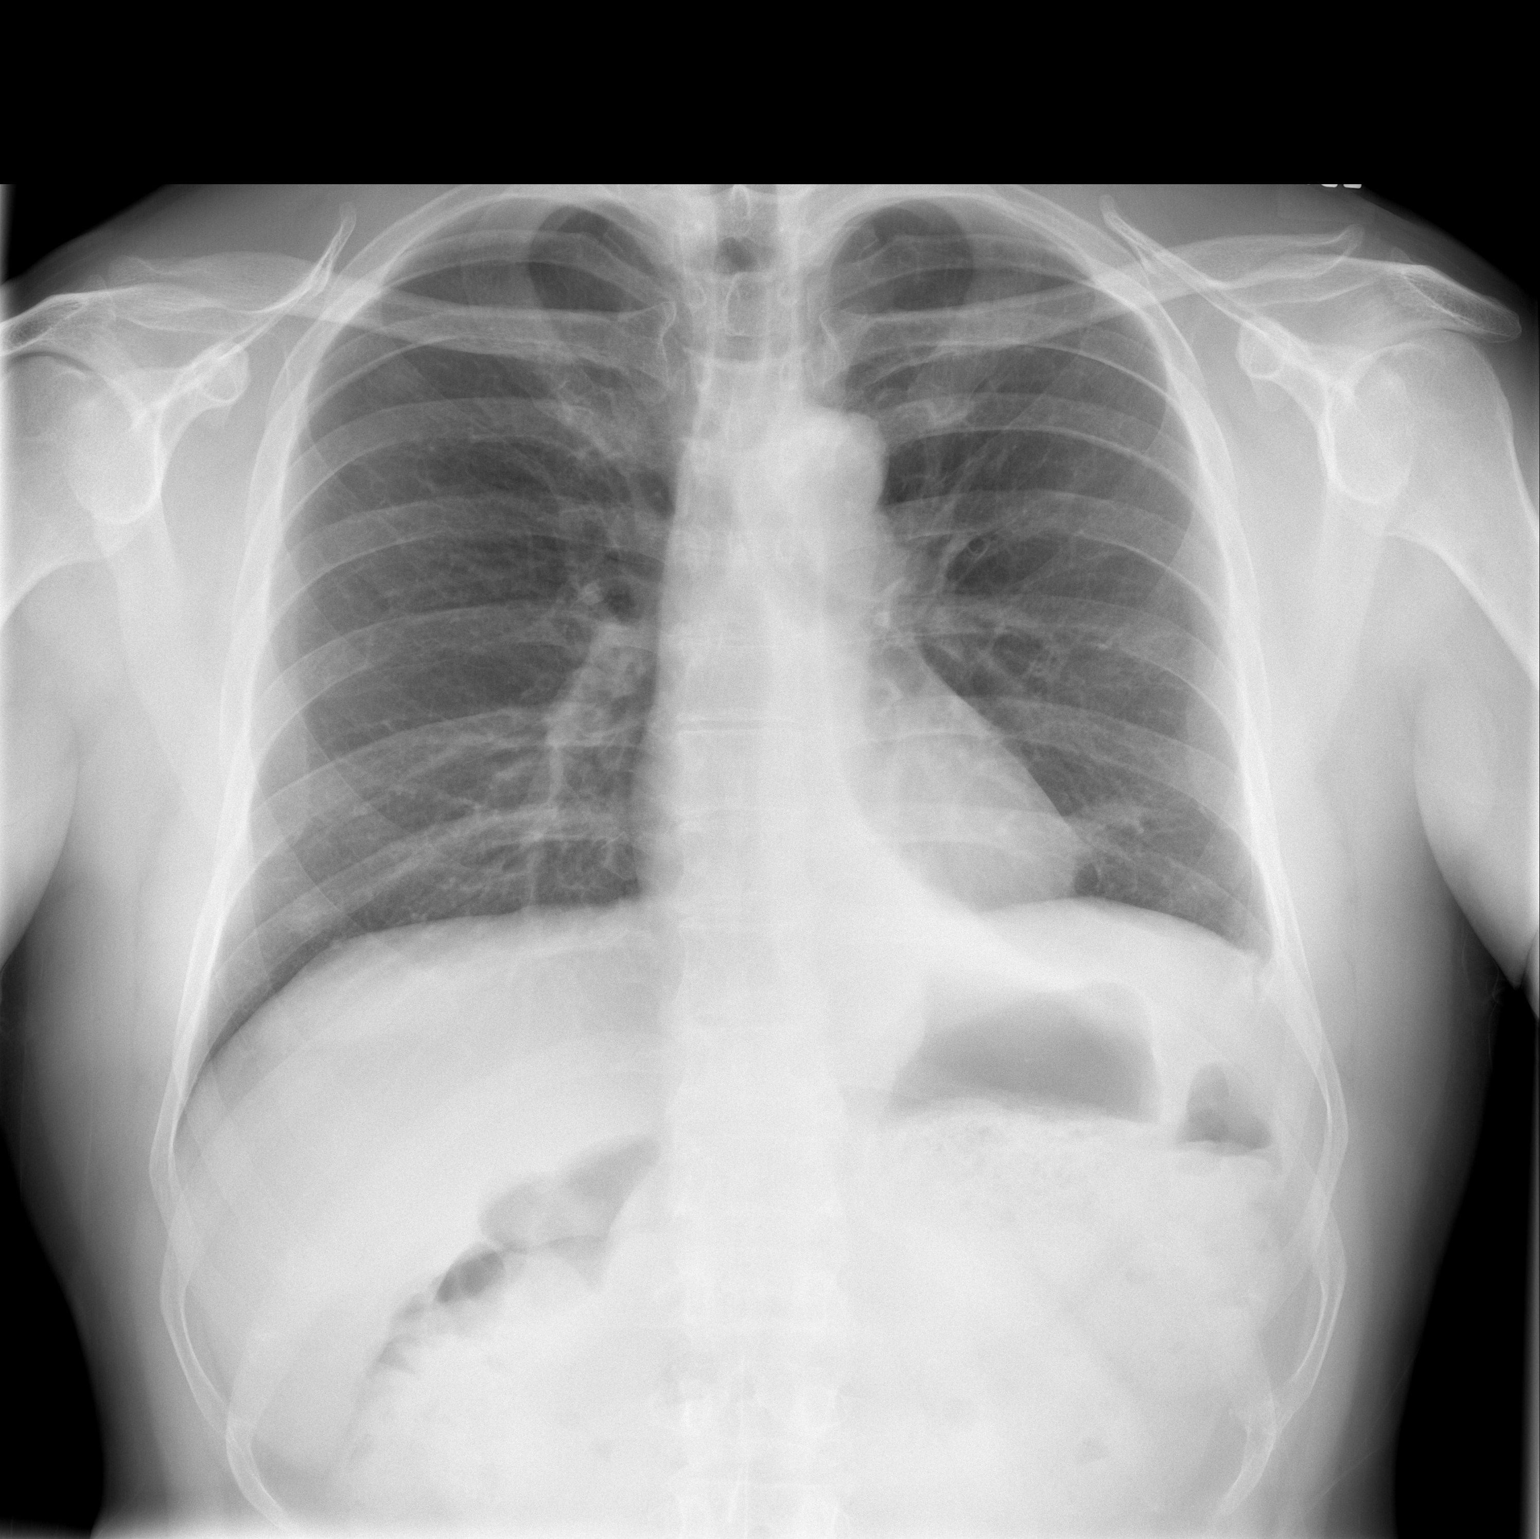

[w chest lat]
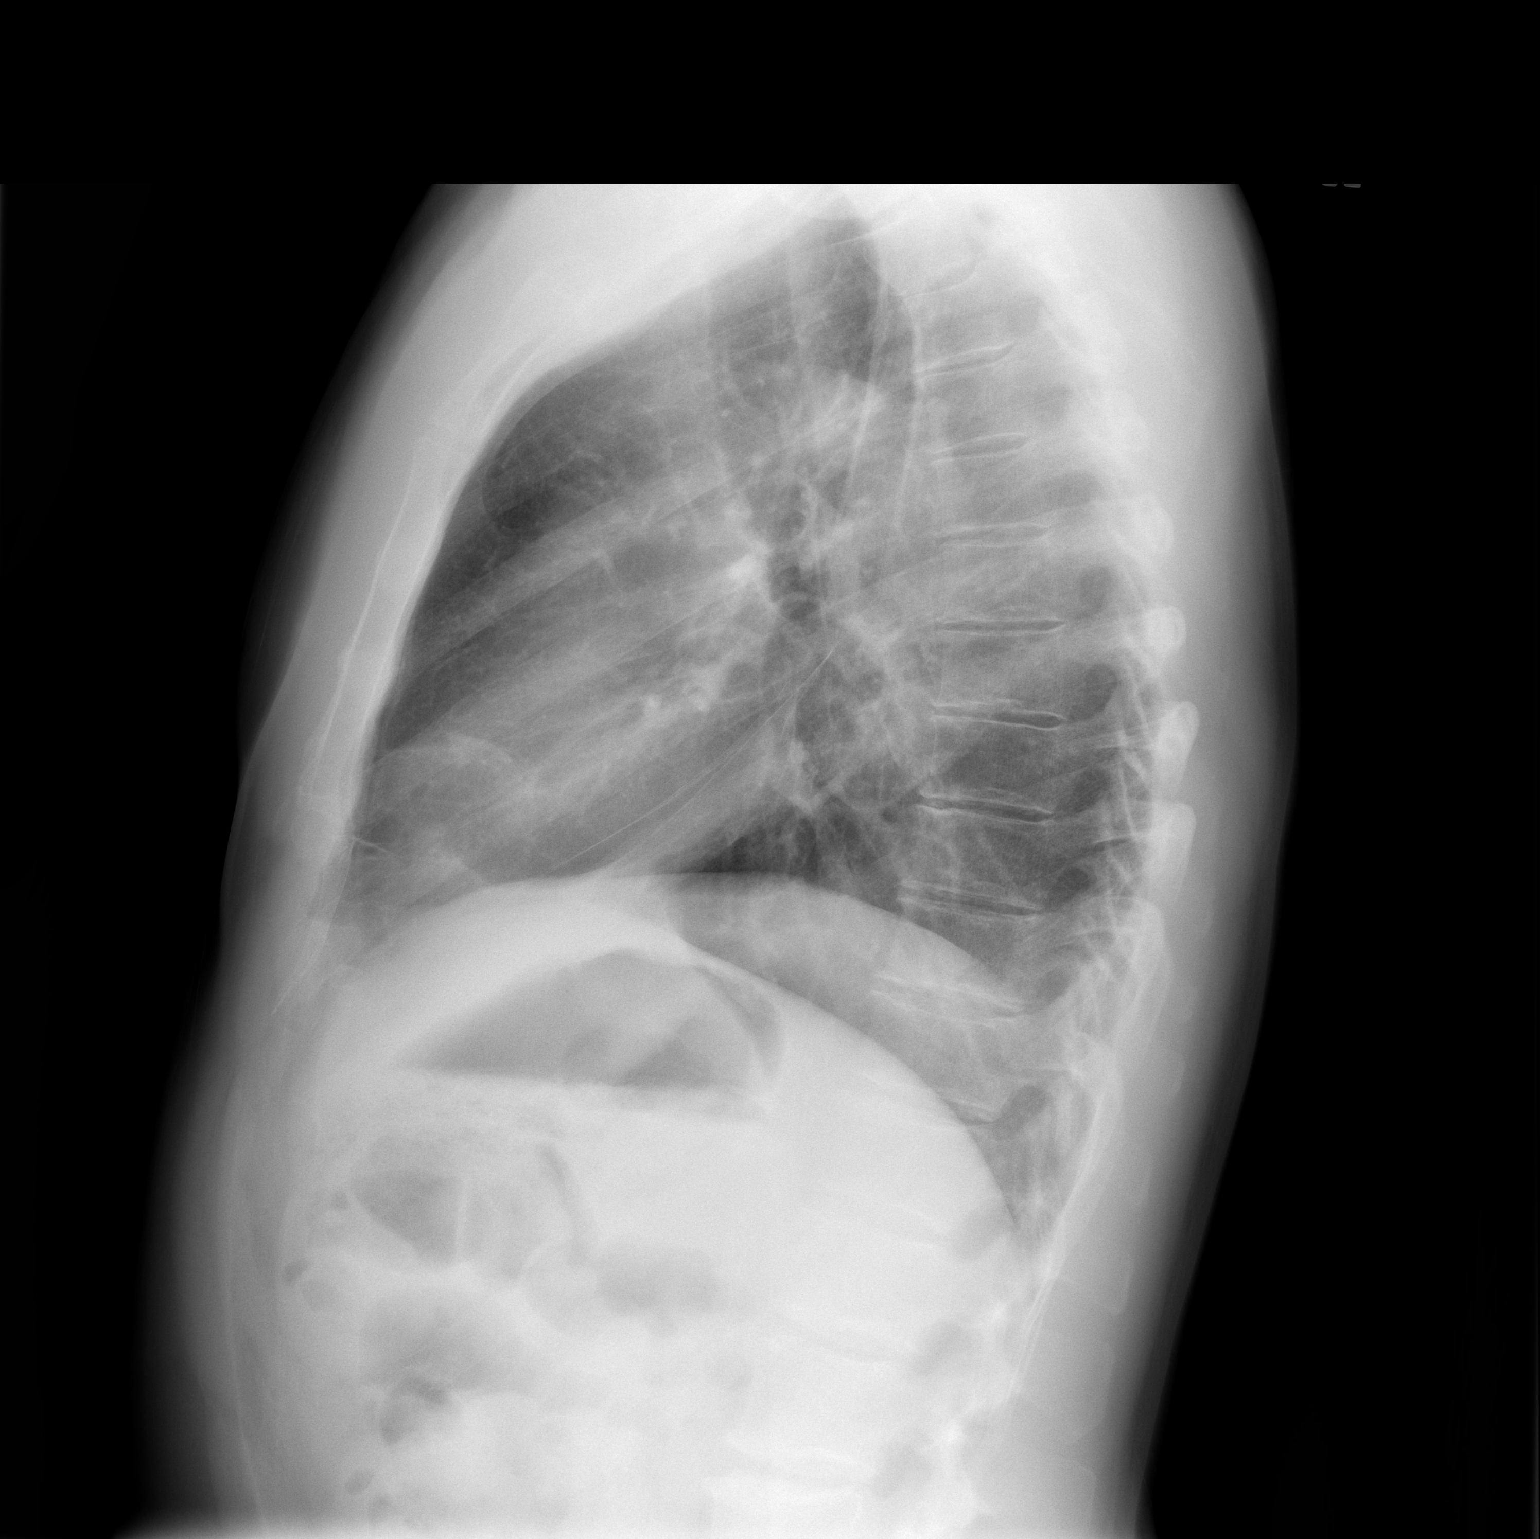

[2 of 2 positions shown; findings below may reference images not displayed]

FINDINGS: No residual left pneumothorax.

Opacity at the left lung base has significantly improved. Mild
residual atelectasis noted in the left upper lobe lingula. Lungs
otherwise clear.

No pleural effusion.

Normal heart, mediastinum and hila.

Skeletal structures are unremarkable.
IMPRESSION: 1. Resolved left pneumothorax.
2. Marked improvement in the left lung base opacity with only mild
residual left upper lobe, lingula, atelectasis persisting. Lungs
otherwise clear.

## 2020-12-15 DIAGNOSIS — L290 Pruritus ani: Secondary | ICD-10-CM | POA: Diagnosis not present

## 2020-12-15 DIAGNOSIS — K644 Residual hemorrhoidal skin tags: Secondary | ICD-10-CM | POA: Diagnosis not present

## 2020-12-16 DIAGNOSIS — E7800 Pure hypercholesterolemia, unspecified: Secondary | ICD-10-CM | POA: Diagnosis not present

## 2020-12-16 DIAGNOSIS — I10 Essential (primary) hypertension: Secondary | ICD-10-CM | POA: Diagnosis not present

## 2020-12-16 DIAGNOSIS — R634 Abnormal weight loss: Secondary | ICD-10-CM | POA: Diagnosis not present

## 2020-12-16 DIAGNOSIS — K219 Gastro-esophageal reflux disease without esophagitis: Secondary | ICD-10-CM | POA: Diagnosis not present

## 2020-12-16 DIAGNOSIS — R7303 Prediabetes: Secondary | ICD-10-CM | POA: Diagnosis not present

## 2020-12-16 DIAGNOSIS — R109 Unspecified abdominal pain: Secondary | ICD-10-CM | POA: Diagnosis not present

## 2021-01-11 DIAGNOSIS — R6881 Early satiety: Secondary | ICD-10-CM | POA: Diagnosis not present

## 2021-01-11 DIAGNOSIS — R634 Abnormal weight loss: Secondary | ICD-10-CM | POA: Diagnosis not present

## 2021-01-11 DIAGNOSIS — R5383 Other fatigue: Secondary | ICD-10-CM | POA: Diagnosis not present

## 2021-01-27 DIAGNOSIS — I10 Essential (primary) hypertension: Secondary | ICD-10-CM | POA: Diagnosis not present

## 2021-03-13 DIAGNOSIS — F419 Anxiety disorder, unspecified: Secondary | ICD-10-CM | POA: Diagnosis not present

## 2021-03-13 DIAGNOSIS — I10 Essential (primary) hypertension: Secondary | ICD-10-CM | POA: Diagnosis not present

## 2021-03-13 DIAGNOSIS — E7800 Pure hypercholesterolemia, unspecified: Secondary | ICD-10-CM | POA: Diagnosis not present

## 2021-04-03 NOTE — Progress Notes (Signed)
Pt. Needs orders for upcoming surgery. ?

## 2021-04-03 NOTE — Patient Instructions (Signed)
DUE TO COVID-19 ONLY ONE VISITOR IS ALLOWED TO COME WITH YOU AND STAY IN THE WAITING ROOM ONLY DURING PRE OP AND PROCEDURE.   **NO VISITORS ARE ALLOWED IN THE SHORT STAY AREA OR RECOVERY ROOM!!**  IF YOU WILL BE ADMITTED INTO THE HOSPITAL YOU ARE ALLOWED ONLY TWO SUPPORT PEOPLE DURING VISITATION HOURS ONLY (7 AM -8PM)   The support person(s) must pass our screening, gel in and out, and wear a mask at all times, including in the patient's room. Patients must also wear a mask when staff or their support person are in the room. Visitors GUEST BADGE MUST BE WORN VISIBLY  One adult visitor may remain with you overnight and MUST be in the room by 8 P.M.  No visitors under the age of 52. Any visitor under the age of 64 must be accompanied by an adult.        Your procedure is scheduled on: 04/28/21   Report to [facility] Main Entrance    Report to admitting at : 5:15 AM   Call this number if you have problems the morning of surgery [phone]   Do not eat food :After Midnight.   May have liquids until : 4:30 AM   day of surgery  CLEAR LIQUID DIET  Foods Allowed                                                                     Foods Excluded  Water, Black Coffee and tea, regular and decaf                             liquids that you cannot  Plain Jell-O in any flavor  (No red)                                           see through such as: Fruit ices (not with fruit pulp)                                     milk, soups, orange juice              Iced Popsicles (No red)                                    All solid food                                   Apple juices Sports drinks like Gatorade (No red) Lightly seasoned clear broth or consume(fat free) Sugar  Sample Menu Breakfast                                Lunch  Supper Cranberry juice                    Beef broth                            Chicken broth Jell-O                                      Grape juice                           Apple juice Coffee or tea                        Jell-O                                      Popsicle                                                Coffee or tea                        Coffee or tea     Oral Hygiene is also important to reduce your risk of infection.                                    Remember - BRUSH YOUR TEETH THE MORNING OF SURGERY WITH YOUR REGULAR TOOTHPASTE   Do NOT smoke after Midnight   Take these medicines the morning of surgery with A SIP OF WATER:gabapentin,pregabalin,bupropion,pantoprazole.   DO NOT TAKE ANY ORAL DIABETIC MEDICATIONS DAY OF YOUR SURGERY                              You may not have any metal on your body including hair pins, jewelry, and body piercing             Do not wear lotions, powders, perfumes/cologne, or deodorant              Men may shave face and neck.   Do not bring valuables to the hospital. [name] IS NOT             RESPONSIBLE   FOR VALUABLES.   Contacts, dentures or bridgework may not be worn into surgery.   Bring small overnight bag day of surgery.    Patients discharged on the day of surgery will not be allowed to drive home.   Special Instructions: Bring a copy of your healthcare power of attorney and living will documents         the day of surgery if you haven't scanned them before.              Please read over the following fact sheets you were given: IF YOU HAVE QUESTIONS ABOUT YOUR PRE-OP INSTRUCTIONS PLEASE CALL [phone]     [facility] - Preparing for Surgery Before surgery, you can play an important role.  Because  skin is not sterile, your skin needs to be as free of germs as possible.  You can reduce the number of germs on your skin by washing with CHG (chlorahexidine gluconate) soap before surgery.  CHG is an antiseptic cleaner which kills germs and bonds with the skin to continue killing germs even after washing. Please DO NOT use if you have an  allergy to CHG or antibacterial soaps.  If your skin becomes reddened/irritated stop using the CHG and inform your nurse when you arrive at Short Stay. Do not shave (including legs and underarms) for at least 48 hours prior to the first CHG shower.  You may shave your face/neck. Please follow these instructions carefully:  1.  Shower with CHG Soap the night before surgery and the  morning of Surgery.  2.  If you choose to wash your hair, wash your hair first as usual with your  normal  shampoo.  3.  After you shampoo, rinse your hair and body thoroughly to remove the  shampoo.                           4.  Use CHG as you would any other liquid soap.  You can apply chg directly  to the skin and wash                       Gently with a scrungie or clean washcloth.  5.  Apply the CHG Soap to your body ONLY FROM THE NECK DOWN.   Do not use on face/ open                           Wound or open sores. Avoid contact with eyes, ears mouth and genitals (private parts).                       Wash face,  Genitals (private parts) with your normal soap.             6.  Wash thoroughly, paying special attention to the area where your surgery  will be performed.  7.  Thoroughly rinse your body with warm water from the neck down.  8.  DO NOT shower/wash with your normal soap after using and rinsing off  the CHG Soap.                9.  Pat yourself dry with a clean towel.            10.  Wear clean pajamas.            11.  Place clean sheets on your bed the night of your first shower and do not  sleep with pets. Day of Surgery : Do not apply any lotions/deodorants the morning of surgery.  Please wear clean clothes to the hospital/surgery center.  FAILURE TO FOLLOW THESE INSTRUCTIONS MAY RESULT IN THE CANCELLATION OF YOUR SURGERY PATIENT SIGNATURE_________________________________  NURSE SIGNATURE__________________________________  ________________________________________________________________________

## 2021-04-17 ENCOUNTER — Other Ambulatory Visit: Payer: Self-pay

## 2021-04-17 ENCOUNTER — Encounter (HOSPITAL_COMMUNITY)
Admission: RE | Admit: 2021-04-17 | Discharge: 2021-04-17 | Disposition: A | Discharge status: Home / Self Care | Payer: BC Managed Care – PPO | Source: Ambulatory Visit | Attending: Surgery | Admitting: Surgery

## 2021-04-17 ENCOUNTER — Encounter (HOSPITAL_COMMUNITY): Payer: Self-pay

## 2021-04-17 VITALS — BP 117/76 | HR 56 | Temp 98.4°F | Ht 67.0 in | Wt 160.0 lb

## 2021-04-17 DIAGNOSIS — I10 Essential (primary) hypertension: Secondary | ICD-10-CM | POA: Diagnosis not present

## 2021-04-17 DIAGNOSIS — Z01818 Encounter for other preprocedural examination: Secondary | ICD-10-CM | POA: Insufficient documentation

## 2021-04-17 HISTORY — DX: Malignant (primary) neoplasm, unspecified: C80.1

## 2021-04-17 LAB — BASIC METABOLIC PANEL
Anion gap: 6 (ref 5–15)
BUN: 10 mg/dL (ref 6–20)
CO2: 28 mmol/L (ref 22–32)
Calcium: 9.1 mg/dL (ref 8.9–10.3)
Chloride: 104 mmol/L (ref 98–111)
Creatinine, Ser: 1.06 mg/dL (ref 0.61–1.24)
GFR, Estimated: >60 mL/min (ref >60)
Glucose, Bld: 103 mg/dL — ABNORMAL HIGH (ref 70–99)
Potassium: 4.4 mmol/L (ref 3.5–5.1)
Sodium: 138 mmol/L (ref 135–145)

## 2021-04-17 LAB — CBC
HCT: 43.3 % (ref 39.0–52.0)
Hemoglobin: 14.3 g/dL (ref 13.0–17.0)
MCH: 32.1 pg (ref 26.0–34.0)
MCHC: 33 g/dL (ref 30.0–36.0)
MCV: 97.1 fL (ref 80.0–100.0)
Platelets: 222 10*3/uL (ref 150–400)
RBC: 4.46 MIL/uL (ref 4.22–5.81)
RDW: 11.8 % (ref 11.5–15.5)
WBC: 5.7 10*3/uL (ref 4.0–10.5)
nRBC: 0 % (ref 0.0–0.2)

## 2021-04-17 NOTE — Progress Notes (Addendum)
COVID Vaccine Completed:NO Date COVID Vaccine completed: COVID vaccine manufacturer: [employer]  COVID Test: N/A  PCP - Dr. Maurice Small. Cardiologist -   Chest x-ray -  EKG -  Stress Test -  ECHO -  Cardiac Cath -  Pacemaker/ICD device last checked:  Sleep Study - Yes CPAP - NO  Fasting Blood Sugar -  Checks Blood Sugar _____ times a day  Blood Thinner Instructions: Aspirin Instructions: Last Dose:  Anesthesia review: Hx: HTN  Patient denies shortness of breath, fever, cough and chest pain at PAT appointment   Patient verbalized understanding of instructions that were given to them at the PAT appointment. Patient was also instructed that they will need to review over the PAT instructions again at home before surgery.

## 2021-04-27 NOTE — H&P (Signed)
DOB: 1966-02-14  Chief Complaint: Anal Itching  History of Present Illness: Jeremy Diaz is a 55 y.o. male who is seen today for recurrent anal itching. He had a similar problem about a year ago and I saw him in the [place] office and his dog just been diagnosed with pinworms we thought that might be related. It got better on its own. He returns today with a problem with itching to a point where he is got a couple areas that of count are broken down. I examined him and made recommendations..  Review of Systems: See HPI as well for other ROS.  ROS   Medical History: Past Medical History:  Diagnosis Date   Anxiety   Hypertension   Patient Active Problem List  Diagnosis   Acute medial meniscal tear   Hav (hallux abducto valgus), left   Depression   Essential hypertension   Hiatal hernia with GERD and esophagitis   Past Surgical History:  Procedure Laterality Date   HERNIA REPAIR    Allergies  Allergen Reactions   Metaxalone Hives, Itching and Rash   Current Outpatient Medications on File Prior to Visit  Medication Sig Dispense Refill   buPROPion (WELLBUTRIN XL) 150 MG XL tablet bupropion HCl XL 150 mg 24 hr tablet, extended release   losartan-hydrochlorothiazide (HYZAAR) 50-12.5 mg tablet   pravastatin (PRAVACHOL) 40 MG tablet   No current facility-administered medications on file prior to visit.   Family History  Problem Relation Age of Onset   Skin cancer Father   High blood pressure (Hypertension) Father   Hyperlipidemia (Elevated cholesterol) Father   Skin cancer Brother    Social History   Tobacco Use  Smoking Status Never Smoker  Smokeless Tobacco Never Used    Social History   Socioeconomic History   Marital status: Unknown  Tobacco Use   Smoking status: Never Smoker   Smokeless tobacco: Never Used  Substance and Sexual Activity   Alcohol use: Not Currently   Drug use: Never   Objective:   Vitals:  BP: 100/70  Pulse: 83  Weight: 70.1 kg (154 lb  9.6 oz)  Height: 170.2 cm (5\' 7" )   Body mass index is 24.21 kg/m.  Physical Exam General: I thin white gentleman no acute distress HEENT: Not examined Chest: Clear to auscultation Heart: Sinus rhythm without murmur Breast: Not examined Abdomen: Not examined GU not examined Rectal there are 2 external skin tags at the 6:00 12:00 positions and there is a whitish area on the left side it could be a flat condyloma to or a squamous skin cancer change or just an area of irritation from itching. This needs to be examined under anesthesia. The skin tags need to be removed as external hemorrhoids and biopsy is necessary. Extremities full range of motion Neuro alert and oriented x3 motor and sensory function grossly intact  Labs, Imaging and Diagnostic Testing:  None contributing  Assessment and Plan:  Diagnoses and all orders for this visit:  Pruritus ani    Plan: To OR for exam under anesthesia under general anesthesia with removal of external hemorrhoids and possible biopsy particularly on the left side.   Meighan Treto , MD

## 2021-04-27 NOTE — Anesthesia Preprocedure Evaluation (Addendum)
Anesthesia Evaluation  Patient identified by MRN, date of birth, ID band Patient awake    Reviewed: Allergy & Precautions, NPO status , Patient's Chart, lab work & pertinent test results  History of Anesthesia Complications (+) PROLONGED EMERGENCE  Airway Mallampati: II  TM Distance: >3 FB     Dental no notable dental hx.    Pulmonary neg pulmonary ROS,    Pulmonary exam normal        Cardiovascular hypertension,  Rhythm:Regular Rate:Normal     Neuro/Psych  Headaches, Anxiety Depression    GI/Hepatic Neg liver ROS, hiatal hernia, GERD  ,Hemorrhoids    Endo/Other  negative endocrine ROS  Renal/GU negative Renal ROS  negative genitourinary   Musculoskeletal  (+) Arthritis , Osteoarthritis,    Abdominal Normal abdominal exam  (+)   Peds  Hematology negative hematology ROS (+)   Anesthesia Other Findings   Reproductive/Obstetrics                            Anesthesia Physical Anesthesia Plan  ASA: 2  Anesthesia Plan: General   Post-op Pain Management:    Induction: Intravenous  PONV Risk Score and Plan: 2 and Ondansetron, Dexamethasone, Midazolam and Treatment may vary due to age or medical condition  Airway Management Planned: Mask and LMA  Additional Equipment: None  Intra-op Plan:   Post-operative Plan: Extubation in OR  Informed Consent: I have reviewed the patients History and Physical, chart, labs and discussed the procedure including the risks, benefits and alternatives for the proposed anesthesia with the patient or authorized representative who has indicated his/her understanding and acceptance.     Dental advisory given  Plan Discussed with: CRNA  Anesthesia Plan Comments: (Lab Results      Component                Value               Date                      WBC                      5.7                 04/17/2021                HGB                      14.3                 04/17/2021                HCT                      43.3                04/17/2021                MCV                      97.1                04/17/2021                PLT  222                 04/17/2021           Lab Results      Component                Value               Date                      NA                       138                 04/17/2021                K                        4.4                 04/17/2021                CO2                      28                  04/17/2021                GLUCOSE                  103 (H)             04/17/2021                BUN                      10                  04/17/2021                CREATININE               1.06                04/17/2021                CALCIUM                  9.1                 04/17/2021                GFRNONAA                 >60                 04/17/2021          )       Anesthesia Quick Evaluation

## 2021-04-28 ENCOUNTER — Ambulatory Visit (HOSPITAL_COMMUNITY)
Admission: RE | Admit: 2021-04-28 | Discharge: 2021-04-28 | Disposition: A | Discharge status: Home / Self Care | Payer: BC Managed Care – PPO | Source: Ambulatory Visit | Attending: Surgery | Admitting: Surgery

## 2021-04-28 ENCOUNTER — Encounter (HOSPITAL_COMMUNITY): Payer: Self-pay | Admitting: Surgery

## 2021-04-28 ENCOUNTER — Ambulatory Visit (HOSPITAL_COMMUNITY): Payer: BC Managed Care – PPO | Admitting: Certified Registered Nurse Anesthetist

## 2021-04-28 ENCOUNTER — Encounter (HOSPITAL_COMMUNITY)
Admission: RE | Disposition: A | Discharge status: Home / Self Care | Payer: Self-pay | Source: Ambulatory Visit | Attending: Surgery

## 2021-04-28 DIAGNOSIS — K601 Chronic anal fissure: Secondary | ICD-10-CM | POA: Diagnosis not present

## 2021-04-28 DIAGNOSIS — K2100 Gastro-esophageal reflux disease with esophagitis, without bleeding: Secondary | ICD-10-CM | POA: Diagnosis not present

## 2021-04-28 DIAGNOSIS — K644 Residual hemorrhoidal skin tags: Secondary | ICD-10-CM | POA: Diagnosis not present

## 2021-04-28 DIAGNOSIS — L290 Pruritus ani: Secondary | ICD-10-CM | POA: Diagnosis not present

## 2021-04-28 DIAGNOSIS — F32A Depression, unspecified: Secondary | ICD-10-CM | POA: Diagnosis not present

## 2021-04-28 DIAGNOSIS — Z9189 Other specified personal risk factors, not elsewhere classified: Secondary | ICD-10-CM | POA: Diagnosis not present

## 2021-04-28 DIAGNOSIS — I10 Essential (primary) hypertension: Secondary | ICD-10-CM | POA: Insufficient documentation

## 2021-04-28 DIAGNOSIS — M1990 Unspecified osteoarthritis, unspecified site: Secondary | ICD-10-CM | POA: Diagnosis not present

## 2021-04-28 DIAGNOSIS — F419 Anxiety disorder, unspecified: Secondary | ICD-10-CM | POA: Insufficient documentation

## 2021-04-28 DIAGNOSIS — R519 Headache, unspecified: Secondary | ICD-10-CM | POA: Diagnosis not present

## 2021-04-28 DIAGNOSIS — N402 Nodular prostate without lower urinary tract symptoms: Secondary | ICD-10-CM

## 2021-04-28 DIAGNOSIS — K449 Diaphragmatic hernia without obstruction or gangrene: Secondary | ICD-10-CM | POA: Insufficient documentation

## 2021-04-28 HISTORY — PX: HEMORRHOID SURGERY: SHX153

## 2021-04-28 HISTORY — PX: RECTAL EXAM UNDER ANESTHESIA: SHX6399

## 2021-04-28 LAB — PSA: Prostatic Specific Antigen: 0.83 ng/mL (ref 0.00–4.00)

## 2021-04-28 SURGERY — HEMORRHOIDECTOMY
Anesthesia: General | Site: Rectum

## 2021-04-28 MED ORDER — DEXAMETHASONE SODIUM PHOSPHATE 10 MG/ML IJ SOLN
INTRAMUSCULAR | Status: AC
  Filled 2021-04-28: qty 1

## 2021-04-28 MED ORDER — POVIDONE-IODINE 10 % EX OINT
TOPICAL_OINTMENT | CUTANEOUS | Status: AC
  Filled 2021-04-28: qty 28.35

## 2021-04-28 MED ORDER — HYDROCODONE-ACETAMINOPHEN 5-325 MG PO TABS: 1.0000 | ORAL_TABLET | Freq: Four times a day (QID) | ORAL | 0 refills | Status: DC | PRN

## 2021-04-28 MED ORDER — ONDANSETRON HCL 4 MG/2ML IJ SOLN
INTRAMUSCULAR | Status: AC
  Filled 2021-04-28: qty 2

## 2021-04-28 MED ORDER — MIDAZOLAM HCL 5 MG/5ML IJ SOLN
INTRAMUSCULAR | Status: DC | PRN
  Administered 2021-04-28: 2 mg via INTRAVENOUS

## 2021-04-28 MED ORDER — FENTANYL CITRATE (PF) 100 MCG/2ML IJ SOLN
INTRAMUSCULAR | Status: AC
  Filled 2021-04-28: qty 2

## 2021-04-28 MED ORDER — ORAL CARE MOUTH RINSE: 15.0000 mL | Freq: Once | OROMUCOSAL | Status: AC

## 2021-04-28 MED ORDER — PROPOFOL 10 MG/ML IV BOLUS
INTRAVENOUS | Status: AC
  Filled 2021-04-28: qty 20

## 2021-04-28 MED ORDER — PROPOFOL 10 MG/ML IV BOLUS
INTRAVENOUS | Status: DC | PRN
  Administered 2021-04-28: 170 mg via INTRAVENOUS

## 2021-04-28 MED ORDER — EPHEDRINE SULFATE-NACL 50-0.9 MG/10ML-% IV SOSY
PREFILLED_SYRINGE | INTRAVENOUS | Status: DC | PRN
  Administered 2021-04-28 (×2): 5 mg via INTRAVENOUS

## 2021-04-28 MED ORDER — CHLORHEXIDINE GLUCONATE CLOTH 2 % EX PADS: 6.0000 | MEDICATED_PAD | Freq: Once | CUTANEOUS | Status: DC

## 2021-04-28 MED ORDER — 0.9 % SODIUM CHLORIDE (POUR BTL) OPTIME
TOPICAL | Status: DC | PRN
  Administered 2021-04-28: 1000 mL

## 2021-04-28 MED ORDER — LACTATED RINGERS IV SOLN: INTRAVENOUS | Status: DC

## 2021-04-28 MED ORDER — FENTANYL CITRATE (PF) 100 MCG/2ML IJ SOLN
INTRAMUSCULAR | Status: DC | PRN
  Administered 2021-04-28: 25 ug via INTRAVENOUS
  Administered 2021-04-28: 50 ug via INTRAVENOUS

## 2021-04-28 MED ORDER — BUPIVACAINE LIPOSOME 1.3 % IJ SUSP
INTRAMUSCULAR | Status: AC
  Filled 2021-04-28: qty 20

## 2021-04-28 MED ORDER — MIDAZOLAM HCL 2 MG/2ML IJ SOLN
INTRAMUSCULAR | Status: AC
  Filled 2021-04-28: qty 2

## 2021-04-28 MED ORDER — BUPIVACAINE LIPOSOME 1.3 % IJ SUSP
20.0000 mL | Freq: Once | INTRAMUSCULAR | Status: DC
  Filled 2021-04-28: qty 20

## 2021-04-28 MED ORDER — CHLORHEXIDINE GLUCONATE 0.12 % MT SOLN
15.0000 mL | Freq: Once | OROMUCOSAL | Status: AC
  Administered 2021-04-28: 15 mL via OROMUCOSAL

## 2021-04-28 MED ORDER — BUPIVACAINE LIPOSOME 1.3 % IJ SUSP
20.0000 mL | Freq: Once | INTRAMUSCULAR | Status: AC
  Administered 2021-04-28: 20 mL

## 2021-04-28 MED ORDER — ONDANSETRON HCL 4 MG/2ML IJ SOLN
INTRAMUSCULAR | Status: DC | PRN
  Administered 2021-04-28: 4 mg via INTRAVENOUS

## 2021-04-28 MED ORDER — SODIUM CHLORIDE 0.9 % IV SOLN
2.0000 g | INTRAVENOUS | Status: AC
  Administered 2021-04-28: 2 g via INTRAVENOUS
  Filled 2021-04-28: qty 2

## 2021-04-28 MED ORDER — LIDOCAINE 2% (20 MG/ML) 5 ML SYRINGE
INTRAMUSCULAR | Status: DC | PRN
  Administered 2021-04-28: 80 mg via INTRAVENOUS

## 2021-04-28 MED ORDER — SCOPOLAMINE 1 MG/3DAYS TD PT72
1.0000 | MEDICATED_PATCH | TRANSDERMAL | Status: DC
  Administered 2021-04-28: 1.5 mg via TRANSDERMAL
  Filled 2021-04-28: qty 1

## 2021-04-28 MED ORDER — LIDOCAINE HCL (PF) 2 % IJ SOLN
INTRAMUSCULAR | Status: AC
  Filled 2021-04-28: qty 5

## 2021-04-28 MED ORDER — DEXAMETHASONE SODIUM PHOSPHATE 10 MG/ML IJ SOLN
INTRAMUSCULAR | Status: DC | PRN
  Administered 2021-04-28: 8 mg via INTRAVENOUS

## 2021-04-28 MED ORDER — FENTANYL CITRATE PF 50 MCG/ML IJ SOSY: 25.0000 ug | PREFILLED_SYRINGE | INTRAMUSCULAR | Status: DC | PRN

## 2021-04-28 MED ORDER — PROMETHAZINE HCL 25 MG/ML IJ SOLN: 6.2500 mg | INTRAMUSCULAR | Status: DC | PRN

## 2021-04-28 MED ORDER — EPHEDRINE 5 MG/ML INJ
INTRAVENOUS | Status: AC
  Filled 2021-04-28: qty 5

## 2021-04-28 MED ORDER — ACETAMINOPHEN 500 MG PO TABS
1000.0000 mg | ORAL_TABLET | ORAL | Status: AC
  Administered 2021-04-28: 1000 mg via ORAL
  Filled 2021-04-28: qty 2

## 2021-04-28 SURGICAL SUPPLY — 27 items
BAG COUNTER SPONGE SURGICOUNT (BAG) IMPLANT
BLADE HEX COATED 2.75 (ELECTRODE) ×2 IMPLANT
BLADE SURG 15 STRL LF DISP TIS (BLADE) ×1 IMPLANT
BLADE SURG 15 STRL SS (BLADE) ×2
COVER SURGICAL LIGHT HANDLE (MISCELLANEOUS) ×2 IMPLANT
DECANTER SPIKE VIAL GLASS SM (MISCELLANEOUS) ×2 IMPLANT
DRSG PAD ABDOMINAL 8X10 ST (GAUZE/BANDAGES/DRESSINGS) ×2 IMPLANT
ELECT REM PT RETURN 15FT ADLT (MISCELLANEOUS) ×2 IMPLANT
GAUZE 4X4 16PLY ~~LOC~~+RFID DBL (SPONGE) ×2 IMPLANT
GAUZE SPONGE 4X4 12PLY STRL (GAUZE/BANDAGES/DRESSINGS) ×2 IMPLANT
GLOVE SURG ENC TEXT LTX SZ8 (GLOVE) ×2 IMPLANT
GOWN SPEC L4 XLG W/TWL (GOWN DISPOSABLE) ×2 IMPLANT
GOWN STRL REUS W/TWL XL LVL3 (GOWN DISPOSABLE) ×6 IMPLANT
KIT BASIN OR (CUSTOM PROCEDURE TRAY) ×2 IMPLANT
KIT TURNOVER KIT A (KITS) IMPLANT
NEEDLE HYPO 22GX1.5 SAFETY (NEEDLE) ×2 IMPLANT
PACK LITHOTOMY IV (CUSTOM PROCEDURE TRAY) ×2 IMPLANT
PANTS MESH DISP LRG (UNDERPADS AND DIAPERS) ×1 IMPLANT
PANTS MESH DISPOSABLE L (UNDERPADS AND DIAPERS) ×1
PENCIL SMOKE EVACUATOR (MISCELLANEOUS) IMPLANT
SHEARS HARMONIC 9CM CVD (BLADE) IMPLANT
SPONGE HEMORRHOID 8X3CM (HEMOSTASIS) IMPLANT
SURGILUBE 2OZ TUBE FLIPTOP (MISCELLANEOUS) ×2 IMPLANT
SUT CHROMIC 2 0 SH (SUTURE) IMPLANT
SUT CHROMIC 3 0 SH 27 (SUTURE) IMPLANT
SYR 20ML LL LF (SYRINGE) ×2 IMPLANT
UNDERPAD 30X36 HEAVY ABSORB (UNDERPADS AND DIAPERS) ×2 IMPLANT

## 2021-04-28 NOTE — Interval H&P Note (Signed)
History and Physical Interval Note:  04/28/2021 7:26 AM  Jeremy Diaz  has presented today for surgery, with the diagnosis of PRUITUS ANI.  The various methods of treatment have been discussed with the patient and family. After consideration of risks, benefits and other options for treatment, the patient has consented to  Procedure(s): HEMORRHOIDECTOMY (N/A) RECTAL EXAM UNDER ANESTHESIA (N/A) as a surgical intervention.  The patient's history has been reviewed, patient examined, no change in status, stable for surgery.  I have reviewed the patient's chart and labs.  Questions were answered to the patient's satisfaction.     Valarie Merino

## 2021-04-28 NOTE — Op Note (Signed)
Jeremy Diaz  10/28/1965   04/28/2021    PCP:  Maurice Small, MD   Surgeon: Wenda Low, MD, FACS  Asst:  none  Anes:  general  Preop Dx: Pruitus ani and external hemorrhoids (skin tags) Postop Dx: Posterior fissure (healed); tags at 1,5,7 with patient in the dorsal lithotomy  Procedure: EUA; left lateral internal sphincterotomy, excision of three external hemorrhoids (skin tags) causing hygiene issues;   Location Surgery: [facility] Complications:  none  EBL:   minimal cc  Drains: none  Description of Procedure:  The patient was taken to OR 1 .  After anesthesia was administered and the patient was prepped  with betadine  and a timeout was performed.  The patient was draped in dorsal lithotomy position.  EUA was performed digitally and with retractors.  There is possibly of a left prostate nodule.  The external hemorroid/skin tags at 1,5, 7 o'clock were excised and cauterised.  The superficial external sphincter was tight and there was scarring from a prior posterior fissure.  To manage this is the left subcutaneous sphincter was incised at its most exterior portion ~ 4 mm and gentle dilatation was performed.  A search was performed for internal hemorrhoids that could be banded but nothing significant was found.  The three specimens were sent together.  A perianal block was performed with 20 cc of Exparel.  Betadine ointment was massaged in to the area.    The patient tolerated the procedure well and was taken to the PACU in stable condition.     Matt B. Daphine Deutscher, MD, [facility] Surgery, [place] [phone]

## 2021-04-28 NOTE — Anesthesia Procedure Notes (Signed)
Procedure Name: LMA Insertion Date/Time: 04/28/2021 7:40 AM Performed by: Wynonia Sours, CRNA Pre-anesthesia Checklist: Patient identified, Emergency Drugs available, Suction available, Patient being monitored and Timeout performed Patient Re-evaluated:Patient Re-evaluated prior to induction Oxygen Delivery Method: Circle system utilized Preoxygenation: Pre-oxygenation with 100% oxygen Induction Type: IV induction LMA: LMA with gastric port inserted LMA Size: 4.0 Number of attempts: 1 Placement Confirmation: positive ETCO2 Tube secured with: Tape Dental Injury: Teeth and Oropharynx as per pre-operative assessment

## 2021-04-28 NOTE — Transfer of Care (Signed)
Immediate Anesthesia Transfer of Care Note  Patient: Jeremy Diaz  Procedure(s) Performed: HEMORRHOIDECTOMY X3, LEFT LATERAL SPHINCTEROTOMY (Rectum) RECTAL EXAM UNDER ANESTHESIA (Rectum)  Patient Location: PACU  Anesthesia Type:General  Level of Consciousness: awake, drowsy and patient cooperative  Airway & Oxygen Therapy: Patient Spontanous Breathing and Patient connected to face mask oxygen  Post-op Assessment: Report given to RN and Post -op Vital signs reviewed and stable  Post vital signs: Reviewed and stable  Last Vitals:  Vitals Value Taken Time  BP 106/79 04/28/21 0834  Temp    Pulse 84 04/28/21 0836  Resp 16 04/28/21 0837  SpO2 100 % 04/28/21 0836  Vitals shown include unvalidated device data.  Last Pain:  Vitals:   04/28/21 0627  TempSrc:   PainSc: 0-No pain      Patients Stated Pain Goal: 4 (04/28/21 3846)  Complications: No notable events documented.

## 2021-04-28 NOTE — Anesthesia Postprocedure Evaluation (Signed)
Anesthesia Post Note  Patient: Jeremy Diaz  Procedure(s) Performed: HEMORRHOIDECTOMY X3, LEFT LATERAL SPHINCTEROTOMY (Rectum) RECTAL EXAM UNDER ANESTHESIA (Rectum)     Patient location during evaluation: PACU Anesthesia Type: General Level of consciousness: awake and alert Pain management: pain level controlled Vital Signs Assessment: post-procedure vital signs reviewed and stable Respiratory status: spontaneous breathing, nonlabored ventilation, respiratory function stable and patient connected to nasal cannula oxygen Cardiovascular status: blood pressure returned to baseline and stable Postop Assessment: no apparent nausea or vomiting Anesthetic complications: no   No notable events documented.  Last Vitals:  Vitals:   04/28/21 0900 04/28/21 0915  BP: (!) 128/92 (!) 123/92  Pulse: 66 61  Resp: 12 11  Temp:    SpO2: 97% 100%    Last Pain:  Vitals:   04/28/21 0900  TempSrc:   PainSc: 0-No pain                 Earl Lites P Reid Nawrot

## 2021-04-29 ENCOUNTER — Encounter (HOSPITAL_COMMUNITY): Payer: Self-pay | Admitting: Surgery

## 2021-05-01 LAB — SURGICAL PATHOLOGY

## 2021-12-18 ENCOUNTER — Emergency Department (HOSPITAL_COMMUNITY)
Admission: EM | Admit: 2021-12-18 | Discharge: 2021-12-18 | Disposition: A | Discharge status: Home / Self Care | Payer: BC Managed Care – PPO | Attending: Emergency Medicine | Admitting: Emergency Medicine

## 2021-12-18 ENCOUNTER — Encounter (HOSPITAL_COMMUNITY): Payer: Self-pay | Admitting: *Deleted

## 2021-12-18 ENCOUNTER — Other Ambulatory Visit: Payer: Self-pay

## 2021-12-18 ENCOUNTER — Emergency Department (HOSPITAL_COMMUNITY): Payer: BC Managed Care – PPO

## 2021-12-18 DIAGNOSIS — Z23 Encounter for immunization: Secondary | ICD-10-CM | POA: Diagnosis not present

## 2021-12-18 DIAGNOSIS — M7989 Other specified soft tissue disorders: Secondary | ICD-10-CM | POA: Diagnosis not present

## 2021-12-18 DIAGNOSIS — R200 Anesthesia of skin: Secondary | ICD-10-CM | POA: Diagnosis not present

## 2021-12-18 DIAGNOSIS — R9431 Abnormal electrocardiogram [ECG] [EKG]: Secondary | ICD-10-CM | POA: Diagnosis not present

## 2021-12-18 DIAGNOSIS — R202 Paresthesia of skin: Secondary | ICD-10-CM | POA: Diagnosis not present

## 2021-12-18 DIAGNOSIS — S6992XA Unspecified injury of left wrist, hand and finger(s), initial encounter: Secondary | ICD-10-CM | POA: Diagnosis not present

## 2021-12-18 DIAGNOSIS — S61012A Laceration without foreign body of left thumb without damage to nail, initial encounter: Secondary | ICD-10-CM | POA: Insufficient documentation

## 2021-12-18 DIAGNOSIS — W312XXA Contact with powered woodworking and forming machines, initial encounter: Secondary | ICD-10-CM | POA: Diagnosis not present

## 2021-12-18 MED ORDER — TETANUS-DIPHTH-ACELL PERTUSSIS 5-2.5-18.5 LF-MCG/0.5 IM SUSY
0.5000 mL | PREFILLED_SYRINGE | Freq: Once | INTRAMUSCULAR | Status: AC
  Administered 2021-12-18: 0.5 mL via INTRAMUSCULAR
  Filled 2021-12-18: qty 0.5

## 2021-12-18 MED ORDER — SULFAMETHOXAZOLE-TRIMETHOPRIM 800-160 MG PO TABS: 1.0000 | ORAL_TABLET | Freq: Two times a day (BID) | ORAL | 0 refills | Status: AC

## 2021-12-18 MED ORDER — BACITRACIN ZINC 500 UNIT/GM EX OINT
TOPICAL_OINTMENT | Freq: Once | CUTANEOUS | Status: AC
  Filled 2021-12-18: qty 0.9

## 2021-12-18 MED ORDER — HYDROCODONE-ACETAMINOPHEN 5-325 MG PO TABS
1.0000 | ORAL_TABLET | Freq: Once | ORAL | Status: AC
  Administered 2021-12-18: 1 via ORAL
  Filled 2021-12-18: qty 1

## 2021-12-18 MED ORDER — LIDOCAINE HCL (PF) 1 % IJ SOLN
10.0000 mL | Freq: Once | INTRAMUSCULAR | Status: AC
  Administered 2021-12-18: 10 mL
  Filled 2021-12-18: qty 10

## 2021-12-18 MED ORDER — BACITRACIN ZINC 500 UNIT/GM EX OINT: 1.0000 | TOPICAL_OINTMENT | Freq: Two times a day (BID) | CUTANEOUS | 0 refills | Status: AC

## 2021-12-18 NOTE — Discharge Instructions (Addendum)
You been provided contact information for Dr. Dallas Schimke.  Please call tomorrow to schedule an appointment on Wednesday or Thursday for reevaluation continue medical management.  An oral antibiotic has also been sent to your pharmacy by the name of Bactrim.  Take 1 tablet every 12 hours for the next week.  Take until the course is been completed.  Always take with plenty of food and water.  In addition a topical antibiotic has been sent to your pharmacy by the name of bacitracin.  Please apply this 1-2 times per day when you are changing the bandages on your wound.  Return to the ED for new or worsening symptoms as discussed

## 2021-12-18 NOTE — ED Notes (Signed)
Cleanse laceration to left thumb with normal saline, covered with gauze dressing.

## 2021-12-18 NOTE — ED Notes (Signed)
Dressing applied to left thumb.

## 2021-12-18 NOTE — ED Triage Notes (Signed)
Pt with lac to left thumb while using a skill saw.   Last tetanus shot unknown ?2013 or 2017.

## 2021-12-18 NOTE — ED Provider Notes (Signed)
  [facility] EMERGENCY DEPARTMENT Provider Note   CSN: [id number] Arrival date & time: 12/18/21  1738     History {Add pertinent medical, surgical, social history, OB history to HPI:1} Chief Complaint  Patient presents with   Laceration    Jeremy Diaz is a 56 y.o. male.  The history is provided by the patient and medical records.  Laceration      Home Medications Prior to Admission medications   Medication Sig Start Date End Date Taking? Authorizing Provider  AMBULATORY NON FORMULARY MEDICATION Inject into the muscle 2 (two) times a week. Medication Name:allergy shots    [provider]  buPROPion (WELLBUTRIN XL) 150 MG 24 hr tablet Take 150 mg by mouth daily.    [provider]  EPINEPHrine 0.3 mg/0.3 mL IJ SOAJ injection Inject 0.3 mg into the muscle once as needed (For anaphylaxis.).  04/30/13   [provider]  HYDROcodone-acetaminophen (NORCO/VICODIN) 5-325 MG tablet Take 1 tablet by mouth every 6 (six) hours as needed for moderate pain. 04/28/21   Luretha Murphy, MD  pravastatin (PRAVACHOL) 40 MG tablet Take 40 mg by mouth daily.    [provider]      Allergies    Other, Metaxalone, Amoxil [amoxicillin trihydrate], Banana, and Skelaxin    Review of Systems   Review of Systems  Physical Exam Updated Vital Signs BP 125/88 (BP Location: Right Arm)   Pulse 91   Temp 97.9 F (36.6 C) (Oral)   Resp 18   Ht 5\' 7"  (1.702 m)   Wt 68 kg   SpO2 98%   BMI 23.49 kg/m  Physical Exam  ED Results / Procedures / Treatments   Labs (all labs ordered are listed, but only abnormal results are displayed) Labs Reviewed - No data to display  EKG None  Radiology DG Finger Thumb Left  Result Date: 12/18/2021 CLINICAL DATA:  Left thumb laceration while using a skill saw. Numbness and tingling. EXAM: LEFT THUMB 2+V COMPARISON:  None Available. FINDINGS: Soft tissue swelling about the distal aspect of the left first finger. No radiopaque  soft tissue foreign bodies or gas collections are demonstrated. Underlying bones appear intact. No acute fracture or dislocation. IMPRESSION: Soft tissue swelling about the distal left first finger. No acute bony abnormalities. Electronically Signed   By: 12/20/2021 M.D.   On: 12/18/2021 18:03    Procedures Procedures  {Document cardiac monitor, telemetry assessment procedure when appropriate:1}  Medications Ordered in ED Medications - No data to display  ED Course/ Medical Decision Making/ A&P                           Medical Decision Making Amount and/or Complexity of Data Reviewed Radiology: ordered.   ***  {Document critical care time when appropriate:1} {Document review of labs and clinical decision tools ie heart score, Chads2Vasc2 etc:1}  {Document your independent review of radiology images, and any outside records:1} {Document your discussion with family members, caretakers, and with consultants:1} {Document social determinants of health affecting pt's care:1} {Document your decision making why or why not admission, treatments were needed:1} Final Clinical Impression(s) / ED Diagnoses Final diagnoses:  None    Rx / DC Orders ED Discharge Orders     None

## 2021-12-19 ENCOUNTER — Encounter: Payer: Self-pay | Admitting: Orthopedic Surgery

## 2021-12-19 ENCOUNTER — Telehealth: Payer: Self-pay | Admitting: Orthopedic Surgery

## 2021-12-19 ENCOUNTER — Ambulatory Visit: Payer: BC Managed Care – PPO | Admitting: Orthopedic Surgery

## 2021-12-19 VITALS — BP 118/81 | HR 66 | Ht 67.0 in | Wt 151.0 lb

## 2021-12-19 DIAGNOSIS — S61012A Laceration without foreign body of left thumb without damage to nail, initial encounter: Secondary | ICD-10-CM | POA: Diagnosis not present

## 2021-12-19 NOTE — Patient Instructions (Signed)
OK to return to work tomorrow, please provide a letter  Keep hand clean and dry.  Keep hand covered at work.   If you notice signs of an infection please contact the clinic  Follow up in 2 weeks.  If you do not have any issues at that time, you can call to cancel the appointment.    Pick up your antibiotic and ointment

## 2021-12-19 NOTE — Telephone Encounter (Signed)
Patient called to relay that Dr Dallas Schimke mentioned at visit today that if the pain was such that he may need something else, to call. Asking if he may have something ordered - he uses [facility], [place].

## 2021-12-20 MED ORDER — HYDROCODONE-ACETAMINOPHEN 5-325 MG PO TABS: 1.0000 | ORAL_TABLET | Freq: Four times a day (QID) | ORAL | 0 refills | Status: AC | PRN

## 2021-12-20 NOTE — Progress Notes (Signed)
New Patient Visit  Assessment: Jeremy Diaz is a 56 y.o. male with the following: 1. Laceration of left thumb without foreign body without damage to nail, initial encounter  Plan: Jeremy Diaz sustained a saw injury to the left thumb.  In the ED, the thumb wound was irrigated.  This is not amenable to suture.  He has been given some antibiotics.  Based on the severity, and the location of the injury, I feel this will heal well.  Advised him to keep his thumb clean and dry.  Take the antibiotics as prescribed.  If he returns to work, he needs to keep this covered.  I will see him back in 2 weeks for repeat evaluation.  If he is doing well at that time, they can call to cancel the appointment.  Follow-up: Return in about 2 weeks (around 01/02/2022).  Subjective:  Chief Complaint  Patient presents with   Laceration    LT thumb/ DOI 12/18/21 ED follow up    History of Present Illness: Jeremy Diaz is a 56 y.o. RHD male who presents for evaluation of his left thumb saw injury.  He was finishing up some work on a ladder for his attic, when he cut his left thumb with a skill saw.  He had immediate pain and bleeding.  He presented to the emergency department.  In the ED, the wound was irrigated.  He was given some antibiotics.  It is distal in stump, and has a small area of decreased sensation just distal to the wound.  This painful and stiff, but he does have intact motion in his thumb.  No other injuries.   Review of Systems: No fevers or chills No numbness or tingling No chest pain No shortness of breath No bowel or bladder dysfunction No GI distress No headaches   Medical History:  Past Medical History:  Diagnosis Date   Anxiety    Cancer (HCC)    Complication of anesthesia    Difficult to wake up after anesthesia per wife   Depression    GERD (gastroesophageal reflux disease) 03/27/2011   tx. Nexium   Headache    Hiatal hernia s/p robotic repair & Nissen fundoplication  06/20/2018 06/20/2018   History of hiatal hernia    History of inguinal hernia repair    BIH   History of Nissen fundoplication 06/20/2018 06/20/2018   Hyperlipidemia    Hypertension    Seasonal allergies    Squamous cell carcinoma in situ (SCCIS) of skin of face     Past Surgical History:  Procedure Laterality Date   ESOPHAGEAL MANOMETRY N/A 07/10/2017   Procedure: ESOPHAGEAL MANOMETRY (EM);  Surgeon: Jeani Hawking, MD;  Location: [facility];  Service: Endoscopy;  Laterality: N/A;   HEMORRHOID SURGERY N/A 04/28/2021   Procedure: HEMORRHOIDECTOMY X3, LEFT LATERAL SPHINCTEROTOMY;  Surgeon: Luretha Murphy, MD;  Location: [facility] ORS;  Service: General;  Laterality: N/A;   HERNIA REPAIR  06/21/2018   INGUINAL HERNIA REPAIR  04/02/2011   Procedure: LAPAROSCOPIC BILATERAL INGUINAL HERNIA REPAIR;  Surgeon: Adolph Pollack, MD;  Location: [facility] ORS;  Service: General;  Laterality: Bilateral;  laparoscopic repair bilateral inguinal hernia   KNEE ARTHROSCOPY Left 10/12/2015   Procedure: LEFT ARTHROSCOPY KNEE WITH MENISCAL DEBRIDEMENT;  Surgeon: Ollen Gross, MD;  Location: [facility] ORS;  Service: Orthopedics;  Laterality: Left;   KNEE SURGERY  2009   right   NOSE SURGERY  200/2003/2008/2012   PH IMPEDANCE STUDY N/A 07/10/2017   Procedure: PH IMPEDANCE  STUDY;  Surgeon: Jeani Hawking, MD;  Location: [facility];  Service: Endoscopy;  Laterality: N/A;   RECTAL EXAM UNDER ANESTHESIA N/A 04/28/2021   Procedure: RECTAL EXAM UNDER ANESTHESIA;  Surgeon: Luretha Murphy, MD;  Location: [facility] ORS;  Service: General;  Laterality: N/A;   SHOULDER SURGERY  2004   left   VARICOCELE EXCISION  2000   WRIST SURGERY Left 2013   XI ROBOTIC ASSISTED PARASTOMAL HERNIA REPAIR N/A 06/20/2018   Procedure: XI ROBOTIC REPAIR OF PARAESOPHAGEAL  HIATAL HERNIA WITH FUNDOPLICATION, WITH MESH;  Surgeon: Karie Soda, MD;  Location: [facility] ORS;  Service: General;  Laterality: N/A;    No family history on file. Social History   Tobacco  Use   Smoking status: Never   Smokeless tobacco: Never  Vaping Use   Vaping Use: Never used  Substance Use Topics   Alcohol use: No   Drug use: No    Allergies  Allergen Reactions   Other Anaphylaxis    Patient can not eat eggs, but he can receive egg derived products.   Metaxalone Rash   Amoxil [Amoxicillin Trihydrate] Diarrhea, Itching and Other (See Comments)    Has patient had a PCN reaction causing immediate rash, facial/tongue/throat swelling, SOB or lightheadedness with hypotension: no Has patient had a PCN reaction causing severe rash involving mucus membranes or skin necrosis: no Has patient had a PCN reaction that required hospitalization no Has patient had a PCN reaction occurring within the last 10 years: yes If all of the above answers are "NO", then may proceed with Cephalosporin use.   Banana Itching   Bee Venom Other (See Comments)   Skelaxin Itching    Current Meds  Medication Sig   AMBULATORY NON FORMULARY MEDICATION Inject into the muscle 2 (two) times a week. Medication Name:allergy shots   bacitracin ointment Apply 1 Application topically 2 (two) times daily.   buPROPion (WELLBUTRIN XL) 150 MG 24 hr tablet Take 150 mg by mouth daily.   EPINEPHrine 0.3 mg/0.3 mL IJ SOAJ injection Inject 0.3 mg into the muscle once as needed (For anaphylaxis.).    HYDROcodone-acetaminophen (NORCO/VICODIN) 5-325 MG tablet Take 1 tablet by mouth every 6 (six) hours as needed for moderate pain.   pravastatin (PRAVACHOL) 40 MG tablet Take 40 mg by mouth daily.   sulfamethoxazole-trimethoprim (BACTRIM DS) 800-160 MG tablet Take 1 tablet by mouth 2 (two) times daily for 7 days.    Objective: BP 118/81   Pulse 66   Ht 5\' 7"  (1.702 m)   Wt 151 lb (68.5 kg)   BMI 23.65 kg/m   Physical Exam:  General: Alert and oriented. and No acute distress. Gait: Normal gait.       Small area of numbness just distal to the laceration on the ulnar aspect of the thumb  IMAGING: I  personally reviewed images previously obtained from the ED  Negative left thumb x-rays.  New Medications:  No orders of the defined types were placed in this encounter.     , MD  12/20/2021 7:42 AM

## 2021-12-25 DIAGNOSIS — L570 Actinic keratosis: Secondary | ICD-10-CM | POA: Diagnosis not present

## 2022-01-05 ENCOUNTER — Ambulatory Visit: Payer: BC Managed Care – PPO | Admitting: Orthopedic Surgery

## 2022-01-08 DIAGNOSIS — R5383 Other fatigue: Secondary | ICD-10-CM | POA: Diagnosis not present

## 2022-01-08 DIAGNOSIS — G4719 Other hypersomnia: Secondary | ICD-10-CM | POA: Diagnosis not present

## 2022-01-08 DIAGNOSIS — R634 Abnormal weight loss: Secondary | ICD-10-CM | POA: Diagnosis not present

## 2022-01-08 DIAGNOSIS — R7303 Prediabetes: Secondary | ICD-10-CM | POA: Diagnosis not present

## 2022-01-08 DIAGNOSIS — G2581 Restless legs syndrome: Secondary | ICD-10-CM | POA: Diagnosis not present

## 2022-02-06 DIAGNOSIS — G4719 Other hypersomnia: Secondary | ICD-10-CM | POA: Diagnosis not present

## 2022-02-06 DIAGNOSIS — G4710 Hypersomnia, unspecified: Secondary | ICD-10-CM | POA: Diagnosis not present

## 2022-07-19 ENCOUNTER — Encounter: Payer: Self-pay | Admitting: Radiology

## 2022-09-25 DIAGNOSIS — M79605 Pain in left leg: Secondary | ICD-10-CM | POA: Diagnosis not present

## 2022-11-08 DIAGNOSIS — M5127 Other intervertebral disc displacement, lumbosacral region: Secondary | ICD-10-CM | POA: Diagnosis not present

## 2022-11-08 DIAGNOSIS — M5416 Radiculopathy, lumbar region: Secondary | ICD-10-CM | POA: Diagnosis not present

## 2022-12-10 DIAGNOSIS — L570 Actinic keratosis: Secondary | ICD-10-CM | POA: Diagnosis not present

## 2022-12-17 ENCOUNTER — Ambulatory Visit: Payer: BC Managed Care – PPO | Admitting: Podiatry

## 2022-12-24 ENCOUNTER — Ambulatory Visit: Payer: BC Managed Care – PPO | Admitting: Podiatry

## 2022-12-24 ENCOUNTER — Ambulatory Visit (INDEPENDENT_AMBULATORY_CARE_PROVIDER_SITE_OTHER): Payer: BC Managed Care – PPO

## 2022-12-24 DIAGNOSIS — R52 Pain, unspecified: Secondary | ICD-10-CM

## 2022-12-24 DIAGNOSIS — M792 Neuralgia and neuritis, unspecified: Secondary | ICD-10-CM

## 2022-12-24 DIAGNOSIS — M19072 Primary osteoarthritis, left ankle and foot: Secondary | ICD-10-CM

## 2022-12-24 MED ORDER — PREGABALIN 75 MG PO CAPS: 75.0000 mg | ORAL_CAPSULE | Freq: Two times a day (BID) | ORAL | 0 refills | Status: AC

## 2022-12-28 NOTE — Progress Notes (Signed)
Subjective: Chief Complaint  Patient presents with   Foot Problem    Left side   57 year old male presents the office today with his wife for concerns of sharp, shooting pain that happened intermittently on his left foot.  Not sure if this is coming from the foot where he had surgery or there is no other issue.  He is to not get the sharp pain into the toes around the foot where he had surgery and starts above the ankle and radiates up.  No recent injuries to his foot.  Objective: AAO x3, NAD DP/PT pulses palpable bilaterally, CRT less than 3 seconds There is no pain to the foot itself we had surgery.  There is no pain or crepitation with MPJ range of motion.  There is no severe edema is no erythema. No pain with calf compression, swelling, warmth, erythema  Assessment: Neuritis left lower extremity  Plan: -All treatment options discussed with the patient including all alternatives, risks, complications.  -X-rays obtained reviewed left foot.  3 views of the foot were obtained.  Hardware intact with any complicating factors.  There is some arthritic changes present the first MPJ. -Patient x-ray as well as clinical findings I do not think the nerve pain is coming from the surgery or the forefoot.  I thinks could be coming from other issues endorsed back.  Will refer to neurology. He has seen neurosurgery and discussed possible injections if needed. -If symptoms persist second MRI of the left foot.  -Patient encouraged to call the office with any questions, concerns, change in symptoms.   Vivi Barrack DPM

## 2023-01-03 DIAGNOSIS — M5416 Radiculopathy, lumbar region: Secondary | ICD-10-CM | POA: Diagnosis not present

## 2023-01-07 DIAGNOSIS — K219 Gastro-esophageal reflux disease without esophagitis: Secondary | ICD-10-CM | POA: Diagnosis not present

## 2023-01-07 DIAGNOSIS — R1310 Dysphagia, unspecified: Secondary | ICD-10-CM | POA: Diagnosis not present

## 2023-01-07 DIAGNOSIS — R079 Chest pain, unspecified: Secondary | ICD-10-CM | POA: Diagnosis not present

## 2023-01-18 DIAGNOSIS — M5416 Radiculopathy, lumbar region: Secondary | ICD-10-CM | POA: Diagnosis not present

## 2023-01-22 DIAGNOSIS — K222 Esophageal obstruction: Secondary | ICD-10-CM | POA: Diagnosis not present

## 2023-01-22 DIAGNOSIS — R1310 Dysphagia, unspecified: Secondary | ICD-10-CM | POA: Diagnosis not present

## 2023-01-22 DIAGNOSIS — B3781 Candidal esophagitis: Secondary | ICD-10-CM | POA: Diagnosis not present

## 2023-01-22 DIAGNOSIS — K2289 Other specified disease of esophagus: Secondary | ICD-10-CM | POA: Diagnosis not present

## 2023-01-22 DIAGNOSIS — R0789 Other chest pain: Secondary | ICD-10-CM | POA: Diagnosis not present

## 2023-01-22 DIAGNOSIS — K219 Gastro-esophageal reflux disease without esophagitis: Secondary | ICD-10-CM | POA: Diagnosis not present

## 2023-02-05 ENCOUNTER — Telehealth: Payer: Self-pay | Admitting: Neurology

## 2023-02-05 NOTE — Telephone Encounter (Signed)
Pt's wife cancelled appt due to have had an injection and feel better.

## 2023-02-20 DIAGNOSIS — K219 Gastro-esophageal reflux disease without esophagitis: Secondary | ICD-10-CM | POA: Diagnosis not present

## 2023-02-20 DIAGNOSIS — R1310 Dysphagia, unspecified: Secondary | ICD-10-CM | POA: Diagnosis not present

## 2023-02-20 DIAGNOSIS — K222 Esophageal obstruction: Secondary | ICD-10-CM | POA: Diagnosis not present

## 2023-04-08 DIAGNOSIS — K219 Gastro-esophageal reflux disease without esophagitis: Secondary | ICD-10-CM | POA: Diagnosis not present

## 2023-04-08 DIAGNOSIS — E7800 Pure hypercholesterolemia, unspecified: Secondary | ICD-10-CM | POA: Diagnosis not present

## 2023-04-08 DIAGNOSIS — R7303 Prediabetes: Secondary | ICD-10-CM | POA: Diagnosis not present

## 2023-04-08 DIAGNOSIS — Z23 Encounter for immunization: Secondary | ICD-10-CM | POA: Diagnosis not present

## 2023-04-08 DIAGNOSIS — Z0000 Encounter for general adult medical examination without abnormal findings: Secondary | ICD-10-CM | POA: Diagnosis not present

## 2023-04-08 DIAGNOSIS — I10 Essential (primary) hypertension: Secondary | ICD-10-CM | POA: Diagnosis not present

## 2023-04-08 DIAGNOSIS — Z125 Encounter for screening for malignant neoplasm of prostate: Secondary | ICD-10-CM | POA: Diagnosis not present

## 2023-04-10 DIAGNOSIS — M5416 Radiculopathy, lumbar region: Secondary | ICD-10-CM | POA: Diagnosis not present

## 2023-04-22 DIAGNOSIS — M5416 Radiculopathy, lumbar region: Secondary | ICD-10-CM | POA: Diagnosis not present

## 2023-04-24 ENCOUNTER — Ambulatory Visit: Payer: BC Managed Care – PPO | Admitting: Neurology

## 2023-04-29 DIAGNOSIS — H9042 Sensorineural hearing loss, unilateral, left ear, with unrestricted hearing on the contralateral side: Secondary | ICD-10-CM | POA: Diagnosis not present

## 2023-05-03 ENCOUNTER — Other Ambulatory Visit: Payer: Self-pay | Admitting: Otolaryngology

## 2023-05-03 DIAGNOSIS — H903 Sensorineural hearing loss, bilateral: Secondary | ICD-10-CM

## 2023-05-07 DIAGNOSIS — M25562 Pain in left knee: Secondary | ICD-10-CM | POA: Diagnosis not present

## 2023-05-16 DIAGNOSIS — H9042 Sensorineural hearing loss, unilateral, left ear, with unrestricted hearing on the contralateral side: Secondary | ICD-10-CM | POA: Diagnosis not present

## 2023-05-16 DIAGNOSIS — H905 Unspecified sensorineural hearing loss: Secondary | ICD-10-CM | POA: Diagnosis not present

## 2023-05-16 DIAGNOSIS — R519 Headache, unspecified: Secondary | ICD-10-CM | POA: Diagnosis not present

## 2023-05-16 DIAGNOSIS — H9319 Tinnitus, unspecified ear: Secondary | ICD-10-CM | POA: Diagnosis not present

## 2023-05-24 ENCOUNTER — Other Ambulatory Visit: Payer: BC Managed Care – PPO

## 2023-06-27 DIAGNOSIS — M25462 Effusion, left knee: Secondary | ICD-10-CM | POA: Diagnosis not present

## 2023-06-27 DIAGNOSIS — M25562 Pain in left knee: Secondary | ICD-10-CM | POA: Diagnosis not present

## 2023-07-09 DIAGNOSIS — M791 Myalgia, unspecified site: Secondary | ICD-10-CM | POA: Diagnosis not present

## 2023-07-09 DIAGNOSIS — Z6823 Body mass index (BMI) 23.0-23.9, adult: Secondary | ICD-10-CM | POA: Diagnosis not present

## 2023-07-09 DIAGNOSIS — J09X2 Influenza due to identified novel influenza A virus with other respiratory manifestations: Secondary | ICD-10-CM | POA: Diagnosis not present

## 2023-07-25 DIAGNOSIS — M25562 Pain in left knee: Secondary | ICD-10-CM | POA: Diagnosis not present

## 2023-08-08 DIAGNOSIS — M25562 Pain in left knee: Secondary | ICD-10-CM | POA: Diagnosis not present

## 2023-08-26 DIAGNOSIS — M25462 Effusion, left knee: Secondary | ICD-10-CM | POA: Diagnosis not present

## 2023-08-26 DIAGNOSIS — X58XXXA Exposure to other specified factors, initial encounter: Secondary | ICD-10-CM | POA: Diagnosis not present

## 2023-08-26 DIAGNOSIS — Y999 Unspecified external cause status: Secondary | ICD-10-CM | POA: Diagnosis not present

## 2023-08-26 DIAGNOSIS — S83242A Other tear of medial meniscus, current injury, left knee, initial encounter: Secondary | ICD-10-CM | POA: Diagnosis not present

## 2023-08-26 DIAGNOSIS — G8918 Other acute postprocedural pain: Secondary | ICD-10-CM | POA: Diagnosis not present

## 2023-08-26 DIAGNOSIS — M948X6 Other specified disorders of cartilage, lower leg: Secondary | ICD-10-CM | POA: Diagnosis not present

## 2023-08-29 DIAGNOSIS — R262 Difficulty in walking, not elsewhere classified: Secondary | ICD-10-CM | POA: Diagnosis not present

## 2023-08-29 DIAGNOSIS — S83232D Complex tear of medial meniscus, current injury, left knee, subsequent encounter: Secondary | ICD-10-CM | POA: Diagnosis not present

## 2023-08-29 DIAGNOSIS — M6281 Muscle weakness (generalized): Secondary | ICD-10-CM | POA: Diagnosis not present

## 2023-08-29 DIAGNOSIS — M25662 Stiffness of left knee, not elsewhere classified: Secondary | ICD-10-CM | POA: Diagnosis not present

## 2023-09-03 DIAGNOSIS — S83232D Complex tear of medial meniscus, current injury, left knee, subsequent encounter: Secondary | ICD-10-CM | POA: Diagnosis not present

## 2023-09-12 DIAGNOSIS — R262 Difficulty in walking, not elsewhere classified: Secondary | ICD-10-CM | POA: Diagnosis not present

## 2023-09-12 DIAGNOSIS — M25662 Stiffness of left knee, not elsewhere classified: Secondary | ICD-10-CM | POA: Diagnosis not present

## 2023-09-12 DIAGNOSIS — M6281 Muscle weakness (generalized): Secondary | ICD-10-CM | POA: Diagnosis not present

## 2023-09-12 DIAGNOSIS — S83232D Complex tear of medial meniscus, current injury, left knee, subsequent encounter: Secondary | ICD-10-CM | POA: Diagnosis not present

## 2023-09-18 DIAGNOSIS — S83232D Complex tear of medial meniscus, current injury, left knee, subsequent encounter: Secondary | ICD-10-CM | POA: Diagnosis not present

## 2023-09-18 DIAGNOSIS — M25662 Stiffness of left knee, not elsewhere classified: Secondary | ICD-10-CM | POA: Diagnosis not present

## 2023-09-18 DIAGNOSIS — M6281 Muscle weakness (generalized): Secondary | ICD-10-CM | POA: Diagnosis not present

## 2023-09-18 DIAGNOSIS — R262 Difficulty in walking, not elsewhere classified: Secondary | ICD-10-CM | POA: Diagnosis not present

## 2023-10-03 DIAGNOSIS — G8929 Other chronic pain: Secondary | ICD-10-CM | POA: Diagnosis not present

## 2023-10-03 DIAGNOSIS — M25562 Pain in left knee: Secondary | ICD-10-CM | POA: Diagnosis not present

## 2023-10-03 DIAGNOSIS — M25561 Pain in right knee: Secondary | ICD-10-CM | POA: Diagnosis not present

## 2023-10-30 DIAGNOSIS — M25562 Pain in left knee: Secondary | ICD-10-CM | POA: Diagnosis not present

## 2023-10-30 DIAGNOSIS — M25561 Pain in right knee: Secondary | ICD-10-CM | POA: Diagnosis not present

## 2023-11-13 DIAGNOSIS — Z8659 Personal history of other mental and behavioral disorders: Secondary | ICD-10-CM | POA: Diagnosis not present

## 2023-11-13 DIAGNOSIS — L2989 Other pruritus: Secondary | ICD-10-CM | POA: Diagnosis not present

## 2023-11-20 DIAGNOSIS — M170 Bilateral primary osteoarthritis of knee: Secondary | ICD-10-CM | POA: Diagnosis not present

## 2024-01-13 DIAGNOSIS — M170 Bilateral primary osteoarthritis of knee: Secondary | ICD-10-CM | POA: Diagnosis not present

## 2024-01-30 DIAGNOSIS — R61 Generalized hyperhidrosis: Secondary | ICD-10-CM | POA: Diagnosis not present

## 2024-01-30 DIAGNOSIS — R519 Headache, unspecified: Secondary | ICD-10-CM | POA: Diagnosis not present

## 2024-01-30 DIAGNOSIS — F439 Reaction to severe stress, unspecified: Secondary | ICD-10-CM | POA: Diagnosis not present

## 2024-01-30 DIAGNOSIS — I10 Essential (primary) hypertension: Secondary | ICD-10-CM | POA: Diagnosis not present

## 2024-01-30 DIAGNOSIS — R7303 Prediabetes: Secondary | ICD-10-CM | POA: Diagnosis not present

## 2024-02-06 DIAGNOSIS — M1712 Unilateral primary osteoarthritis, left knee: Secondary | ICD-10-CM | POA: Diagnosis not present

## 2024-03-12 DIAGNOSIS — M1712 Unilateral primary osteoarthritis, left knee: Secondary | ICD-10-CM | POA: Diagnosis not present

## 2024-03-12 DIAGNOSIS — Z01818 Encounter for other preprocedural examination: Secondary | ICD-10-CM | POA: Diagnosis not present

## 2024-03-25 DIAGNOSIS — G8929 Other chronic pain: Secondary | ICD-10-CM | POA: Diagnosis not present

## 2024-03-25 DIAGNOSIS — M25562 Pain in left knee: Secondary | ICD-10-CM | POA: Diagnosis not present

## 2024-04-01 DIAGNOSIS — M1712 Unilateral primary osteoarthritis, left knee: Secondary | ICD-10-CM | POA: Diagnosis not present

## 2024-04-28 DIAGNOSIS — I10 Essential (primary) hypertension: Secondary | ICD-10-CM | POA: Diagnosis not present

## 2024-04-28 DIAGNOSIS — Z0000 Encounter for general adult medical examination without abnormal findings: Secondary | ICD-10-CM | POA: Diagnosis not present

## 2024-04-28 DIAGNOSIS — K222 Esophageal obstruction: Secondary | ICD-10-CM | POA: Diagnosis not present

## 2024-04-28 DIAGNOSIS — Z96652 Presence of left artificial knee joint: Secondary | ICD-10-CM | POA: Diagnosis not present

## 2024-04-28 DIAGNOSIS — R7303 Prediabetes: Secondary | ICD-10-CM | POA: Diagnosis not present

## 2024-04-28 DIAGNOSIS — Z125 Encounter for screening for malignant neoplasm of prostate: Secondary | ICD-10-CM | POA: Diagnosis not present

## 2024-04-28 DIAGNOSIS — E7800 Pure hypercholesterolemia, unspecified: Secondary | ICD-10-CM | POA: Diagnosis not present

## 2024-04-28 DIAGNOSIS — K219 Gastro-esophageal reflux disease without esophagitis: Secondary | ICD-10-CM | POA: Diagnosis not present

## 2024-05-12 DIAGNOSIS — R972 Elevated prostate specific antigen [PSA]: Secondary | ICD-10-CM | POA: Diagnosis not present
